# Patient Record
Sex: Male | Born: 1985 | Race: Black or African American | Hispanic: No | Marital: Single | Smoking: Former smoker
Health system: Southern US, Community
[De-identification: ages and names within clinical notes are randomized; demographics above are authoritative.]

## PROBLEM LIST (undated history)

## (undated) DIAGNOSIS — F32A Depression, unspecified: Secondary | ICD-10-CM

## (undated) DIAGNOSIS — F419 Anxiety disorder, unspecified: Secondary | ICD-10-CM

## (undated) DIAGNOSIS — F329 Major depressive disorder, single episode, unspecified: Secondary | ICD-10-CM

## (undated) DIAGNOSIS — G35 Multiple sclerosis: Secondary | ICD-10-CM

## (undated) DIAGNOSIS — I82409 Acute embolism and thrombosis of unspecified deep veins of unspecified lower extremity: Secondary | ICD-10-CM

## (undated) HISTORY — DX: Major depressive disorder, single episode, unspecified: F32.9

## (undated) HISTORY — DX: Depression, unspecified: F32.A

## (undated) HISTORY — DX: Anxiety disorder, unspecified: F41.9

---

## 2007-04-22 ENCOUNTER — Emergency Department (HOSPITAL_COMMUNITY): Admission: EM | Admit: 2007-04-22 | Discharge: 2007-04-22 | Payer: Self-pay | Admitting: Emergency Medicine

## 2007-06-02 ENCOUNTER — Emergency Department (HOSPITAL_COMMUNITY): Admission: EM | Admit: 2007-06-02 | Discharge: 2007-06-02 | Payer: Self-pay | Admitting: Family Medicine

## 2009-01-08 ENCOUNTER — Emergency Department (HOSPITAL_COMMUNITY): Admission: EM | Admit: 2009-01-08 | Discharge: 2009-01-08 | Payer: Self-pay | Admitting: Emergency Medicine

## 2009-09-15 ENCOUNTER — Emergency Department (HOSPITAL_COMMUNITY): Admission: EM | Admit: 2009-09-15 | Discharge: 2009-09-15 | Payer: Self-pay | Admitting: Emergency Medicine

## 2010-08-11 LAB — URINALYSIS, ROUTINE W REFLEX MICROSCOPIC
Hgb urine dipstick: NEGATIVE
Protein, ur: NEGATIVE mg/dL
Specific Gravity, Urine: 1.026 (ref 1.005–1.030)
Urobilinogen, UA: 1 mg/dL (ref 0.0–1.0)
pH: 5.5 (ref 5.0–8.0)

## 2010-08-11 LAB — RAPID URINE DRUG SCREEN, HOSP PERFORMED
Amphetamines: NOT DETECTED
Barbiturates: NOT DETECTED
Cocaine: NOT DETECTED

## 2010-08-11 LAB — POCT I-STAT, CHEM 8
Calcium, Ion: 1.18 mmol/L (ref 1.12–1.32)
Chloride: 105 mEq/L (ref 96–112)
Creatinine, Ser: 1.2 mg/dL (ref 0.4–1.5)
HCT: 47 % (ref 39.0–52.0)
Hemoglobin: 16 g/dL (ref 13.0–17.0)
TCO2: 25 mmol/L (ref 0–100)

## 2010-08-11 LAB — CK TOTAL AND CKMB (NOT AT ARMC): Total CK: 325 U/L — ABNORMAL HIGH (ref 7–232)

## 2012-05-07 DIAGNOSIS — I82409 Acute embolism and thrombosis of unspecified deep veins of unspecified lower extremity: Secondary | ICD-10-CM

## 2012-05-07 HISTORY — DX: Acute embolism and thrombosis of unspecified deep veins of unspecified lower extremity: I82.409

## 2012-07-06 ENCOUNTER — Encounter (HOSPITAL_COMMUNITY): Payer: Self-pay | Admitting: *Deleted

## 2012-07-06 ENCOUNTER — Emergency Department (INDEPENDENT_AMBULATORY_CARE_PROVIDER_SITE_OTHER)
Admission: EM | Admit: 2012-07-06 | Discharge: 2012-07-06 | Disposition: A | Discharge status: Home / Self Care | Payer: Self-pay | Source: Home / Self Care | Attending: Emergency Medicine | Admitting: Emergency Medicine

## 2012-07-06 DIAGNOSIS — S46011A Strain of muscle(s) and tendon(s) of the rotator cuff of right shoulder, initial encounter: Secondary | ICD-10-CM

## 2012-07-06 DIAGNOSIS — S239XXA Sprain of unspecified parts of thorax, initial encounter: Secondary | ICD-10-CM

## 2012-07-06 MED ORDER — MELOXICAM 15 MG PO TABS: 15.0000 mg | ORAL_TABLET | Freq: Every day | ORAL | Status: DC

## 2012-07-06 MED ORDER — METHOCARBAMOL 500 MG PO TABS: 500.0000 mg | ORAL_TABLET | Freq: Three times a day (TID) | ORAL | Status: DC

## 2012-07-06 MED ORDER — TRAMADOL HCL 50 MG PO TABS: 100.0000 mg | ORAL_TABLET | Freq: Three times a day (TID) | ORAL | Status: DC | PRN
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
## 2013-02-02 ENCOUNTER — Ambulatory Visit: Payer: Self-pay | Admitting: Physician Assistant

## 2013-02-04 ENCOUNTER — Emergency Department (HOSPITAL_COMMUNITY)
Admission: EM | Admit: 2013-02-04 | Discharge: 2013-02-04 | Disposition: A | Discharge status: Home / Self Care | Payer: 59 | Attending: Emergency Medicine | Admitting: Emergency Medicine

## 2013-02-04 ENCOUNTER — Encounter (HOSPITAL_COMMUNITY): Payer: Self-pay | Admitting: Emergency Medicine

## 2013-02-04 DIAGNOSIS — Z791 Long term (current) use of non-steroidal anti-inflammatories (NSAID): Secondary | ICD-10-CM | POA: Insufficient documentation

## 2013-02-04 DIAGNOSIS — Z79899 Other long term (current) drug therapy: Secondary | ICD-10-CM | POA: Insufficient documentation

## 2013-02-04 DIAGNOSIS — I8390 Asymptomatic varicose veins of unspecified lower extremity: Secondary | ICD-10-CM | POA: Insufficient documentation

## 2013-02-04 DIAGNOSIS — M25561 Pain in right knee: Secondary | ICD-10-CM

## 2013-02-04 MED ORDER — IBUPROFEN 800 MG PO TABS
800.0000 mg | ORAL_TABLET | Freq: Once | ORAL | Status: AC
  Administered 2013-02-04: 800 mg via ORAL
  Filled 2013-02-04: qty 1

## 2013-02-04 NOTE — ED Notes (Signed)
Pt has multiple hard, moveable lumps to the inside of his R lower leg. Pt reports pain when bending his knee, or driving for long periods of time.  Pt reports these have been present for the past year, and came in tonight because a family member was concerned.  Pt has not had this evaluated before.

## 2013-02-04 NOTE — ED Provider Notes (Signed)
CSN: [id number]     Arrival date & time 02/04/13  2030 History   First MD Initiated Contact with Patient 02/04/13 2126     Chief Complaint  Patient presents with  . Leg Pain   (Consider location/radiation/quality/duration/timing/severity/associated sxs/prior Treatment) HPI  IZAAK SAHR is a 27 y.o. male complaining of non-painful lumps to medial side of lower legs (right greater than left) and atraumatic right knee pain x1 year. Nothing is character, frequency  or severity is changing but he came in tonight because a family member was concerned. Pt denies DVT or PE, calf pain, peripheral edema, chest pain, shortness of breath, difficulty walking, numbness, weakness, paresthesia. Patient reports the pain in the knee is mild, 3/10, exacerbated by staying still for long periods of time.   History reviewed. No pertinent past medical history. History reviewed. No pertinent past surgical history. History reviewed. No pertinent family history. History  Substance Use Topics  . Smoking status: Never Smoker   . Smokeless tobacco: Not on file  . Alcohol Use: Yes     Comment: occasional    Review of Systems 10 systems reviewed and found to be negative, except as noted in the HPI   Allergies  Review of patient's allergies indicates no known allergies.  Home Medications   Current Outpatient Rx  Name  Route  Sig  Dispense  Refill  . meloxicam (MOBIC) 15 MG tablet   Oral   Take 1 tablet (15 mg total) by mouth daily.   15 tablet   0   . methocarbamol (ROBAXIN) 500 MG tablet   Oral   Take 1 tablet (500 mg total) by mouth 3 (three) times daily.   30 tablet   0   . traMADol (ULTRAM) 50 MG tablet   Oral   Take 2 tablets (100 mg total) by mouth every 8 (eight) hours as needed for pain.   30 tablet   0    BP 148/82  Pulse 80  Temp(Src) 98 F (36.7 C) (Oral)  Resp 18  Ht 6\' 5"  (1.956 m)  Wt 370 lb (167.831 kg)  BMI 43.87 kg/m2  SpO2 100% Physical Exam  Nursing note and  vitals reviewed. Constitutional: He is oriented to person, place, and time. He appears well-developed and well-nourished. No distress.  Morbidly obese  HENT:  Head: Normocephalic.  Mouth/Throat: Oropharynx is clear and moist.  Eyes: Conjunctivae and EOM are normal. Pupils are equal, round, and reactive to light.  Neck: Normal range of motion.  Cardiovascular: Normal rate, regular rhythm and intact distal pulses.  Exam reveals no gallop and no friction rub.   No murmur heard. Severe varicosity to bilateral lower extremities, right greater than left. No signs of phlebitis no overlying warmth or tenderness to palpation.  Pulmonary/Chest: Effort normal and breath sounds normal. No stridor. No respiratory distress. He has no wheezes. He has no rales. He exhibits no tenderness.  Abdominal: Soft. Bowel sounds are normal. He exhibits no distension and no mass. There is no tenderness. There is no rebound and no guarding.  Musculoskeletal: Normal range of motion. He exhibits no edema.  No calf asymmetry, superficial collaterals, palpable cords, edema, Homans sign negative bilaterally.   Right Knee: No deformity, erythema or abrasions. FROM. No effusion,  +crepitance. Anterior and posterior drawer show no abnormal laxity. Stable to valgus and varus stress. Joint lines are non-tender. Neurovascularly intact. Pt ambulates with non-antalgic gait.     Neurological: He is alert and oriented to person, place, and  time.  Skin: Skin is warm.  No rashes  Psychiatric: He has a normal mood and affect.    ED Course  Procedures (including critical care time) Labs Review Labs Reviewed - No data to display Imaging Review No results found.  MDM   1. Varicose vein of leg   2. Right knee pain    Filed Vitals:   02/04/13 2033 02/04/13 2106 02/04/13 2131  BP: 139/93 148/82 149/88  Pulse: 82 80 74  Temp: 98.3 F (36.8 C) 98 F (36.7 C) 97.9 F (36.6 C)  TempSrc: Oral Oral Oral  Resp: 16 18 20   Height:   6\' 5"  (1.956 m)   Weight:  370 lb (167.831 kg)   SpO2: 100% 100% 100%     YOVANI COGBURN is a 27 y.o. male obese male with some varicosities, no signs of phlebitis or DVT and right knee pain, knee exam is benign except for a mild crepitance. Advised patient to use compression stockings, follow with primary care and vascular surgery. Return precautions discussed. This is a shared visit with the attending physician who personally evaluated the patient and agrees with the care plan.    Medications  ibuprofen (ADVIL,MOTRIN) tablet 800 mg (800 mg Oral Given 02/04/13 2207)    Pt is hemodynamically stable, appropriate for, and amenable to discharge at this time. Pt verbalized understanding and agrees with care plan. All questions answered. Outpatient follow-up and specific return precautions discussed.    Note: Portions of this report may have been transcribed using voice recognition software. Every effort was made to ensure accuracy; however, inadvertent computerized transcription errors may be present      Wynetta Emery, PA-C 02/05/13 0130

## 2013-02-04 NOTE — ED Notes (Signed)
Pt denies pain in his right leg at this time, has had pain on and off for the last year. Came to the ED tonight due to concerns from his grandmother. Large moveable lumps on the medial calf noted/

## 2013-02-06 ENCOUNTER — Encounter (HOSPITAL_COMMUNITY): Payer: Self-pay

## 2013-02-06 ENCOUNTER — Emergency Department (INDEPENDENT_AMBULATORY_CARE_PROVIDER_SITE_OTHER)
Admission: EM | Admit: 2013-02-06 | Discharge: 2013-02-06 | Disposition: A | Discharge status: Home / Self Care | Payer: 59 | Source: Home / Self Care

## 2013-02-06 DIAGNOSIS — I8390 Asymptomatic varicose veins of unspecified lower extremity: Secondary | ICD-10-CM

## 2013-02-06 DIAGNOSIS — R200 Anesthesia of skin: Secondary | ICD-10-CM

## 2013-02-06 DIAGNOSIS — I8391 Asymptomatic varicose veins of right lower extremity: Secondary | ICD-10-CM

## 2013-02-06 DIAGNOSIS — R209 Unspecified disturbances of skin sensation: Secondary | ICD-10-CM

## 2013-02-06 NOTE — ED Notes (Signed)
[facility] for [facility] sign up process

## 2013-02-06 NOTE — ED Notes (Signed)
Reports he has been having feeling numbness and pain on his right side for past few days. Was seen for pain in right leg 10-1 @ [facility] , d/c dx was varicose veins

## 2013-02-06 NOTE — ED Provider Notes (Signed)
CSN: [id number]     Arrival date & time 02/06/13  1204 History   First MD Initiated Contact with Patient 02/06/13 1240     Chief Complaint  Patient presents with  . Weakness   (Consider location/radiation/quality/duration/timing/severity/associated sxs/prior Treatment) HPI Comments: Morbidly obese 27 year old male is complaining of intermittent right arm and leg tingling and weakness for 3 days. He works in a [employer] and states it is for his a physical burden is working overtime. He is right handed and uses of right-handed for picking, washing dishes picking up objects moving from one side to another and is on his legs for long periods of time. This seems to exacerbate his symptoms. There is mild pain to the right posterior shoulder within trapezius muscle. Denies weakness or asymmetry to the face. No problems with vision, speech, swallowing or hearing. States his gait is normal, bounced and smooth.   History reviewed. No pertinent past medical history. History reviewed. No pertinent past surgical history. History reviewed. No pertinent family history. History  Substance Use Topics  . Smoking status: Never Smoker   . Smokeless tobacco: Not on file  . Alcohol Use: Yes     Comment: occasional    Review of Systems  HENT: Negative.   Respiratory: Negative.   Cardiovascular: Negative.   Gastrointestinal: Negative.   Genitourinary: Negative.   Neurological: Positive for weakness and numbness. Negative for light-headedness and headaches.  Psychiatric/Behavioral: Negative.     Allergies  Review of patient's allergies indicates no known allergies.  Home Medications  No current outpatient prescriptions on file. BP 120/67  Pulse 87  Temp(Src) 98.1 F (36.7 C) (Oral)  Resp 16  SpO2 100% Physical Exam  Nursing note and vitals reviewed. Constitutional: He is oriented to person, place, and time. He appears well-developed and well-nourished.  HENT:  Head: Normocephalic and atraumatic.   Eyes: EOM are normal. Left eye exhibits no discharge.  Neck: Normal range of motion. Neck supple.  Neurological: He is alert and oriented to person, place, and time. No cranial nerve deficit.  Sensation to light touch with a Q-tip along the right shoulder to the fingertips is normal. Sensation from the knee to the ankle is intact. Strength of the upper arms, shoulder forearms wrist and hands are 5 over 5 and symmetric. Strength in the right lower leg is normal. Quads are normal Is normal. There is no edema. Full range of motion of the extremities. Capillary refill is brisk. Radial and tibialis pulses are 2+. Normal warmth and color. Distal neurovascular motor sensory is normal. No abnormal findings on this exam. His balance is normal.  Skin: Skin is warm and dry.  Psychiatric: He has a normal mood and affect.    ED Course  Procedures (including critical care time) Labs Review Labs Reviewed - No data to display Imaging Review No results found.  MDM   1. Numbness and tingling of right arm and leg   2. 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 He was evaluated emergent apartment 2 days ago with a diagnosis of varicosities in the right leg. Circulation otherwise normal with good pulses. Reassurance. He is to followup with a PCP and call the number written above. Red flags in warning symptoms were discussed in written instructions for this are also given. He is exhibiting no signs of CVA.  Hayden Rasmussen, NP 02/06/13 1349

## 2013-02-07 NOTE — ED Provider Notes (Signed)
Medical screening examination/treatment/procedure(s) were performed by non-physician practitioner and as supervising physician I was immediately available for consultation/collaboration.    Vickee Mormino R Ingrid Shifrin, MD 02/07/13 0842 

## 2013-02-07 NOTE — ED Provider Notes (Signed)
Medical screening examination/treatment/procedure(s) were performed by resident physician or non-physician practitioner and as supervising physician I was immediately available for consultation/collaboration.   Matthews Franks DOUGLAS MD.   Abhay Godbolt D Serine Kea, MD 02/07/13 1549 

## 2013-06-07 HISTORY — PX: VASCULAR SURGERY: SHX849

## 2013-08-13 ENCOUNTER — Emergency Department (HOSPITAL_COMMUNITY): Payer: 59

## 2013-08-13 ENCOUNTER — Emergency Department (HOSPITAL_COMMUNITY)
Admission: EM | Admit: 2013-08-13 | Discharge: 2013-08-13 | Disposition: A | Discharge status: Home / Self Care | Payer: 59 | Attending: Emergency Medicine | Admitting: Emergency Medicine

## 2013-08-13 ENCOUNTER — Encounter (HOSPITAL_COMMUNITY): Payer: Self-pay | Admitting: Emergency Medicine

## 2013-08-13 DIAGNOSIS — R5383 Other fatigue: Principal | ICD-10-CM

## 2013-08-13 DIAGNOSIS — G35 Multiple sclerosis: Secondary | ICD-10-CM | POA: Insufficient documentation

## 2013-08-13 DIAGNOSIS — R531 Weakness: Secondary | ICD-10-CM

## 2013-08-13 DIAGNOSIS — R5381 Other malaise: Secondary | ICD-10-CM | POA: Insufficient documentation

## 2013-08-13 DIAGNOSIS — Z86718 Personal history of other venous thrombosis and embolism: Secondary | ICD-10-CM | POA: Insufficient documentation

## 2013-08-13 HISTORY — DX: Acute embolism and thrombosis of unspecified deep veins of unspecified lower extremity: I82.409

## 2013-08-13 LAB — APTT: aPTT: 30 seconds (ref 24–37)

## 2013-08-13 LAB — COMPREHENSIVE METABOLIC PANEL
ALBUMIN: 4.3 g/dL (ref 3.5–5.2)
ALK PHOS: 58 U/L (ref 39–117)
ALT: 21 U/L (ref 0–53)
AST: 22 U/L (ref 0–37)
BUN: 9 mg/dL (ref 6–23)
CALCIUM: 9.6 mg/dL (ref 8.4–10.5)
CO2: 21 meq/L (ref 19–32)
Chloride: 104 mEq/L (ref 96–112)
Creatinine, Ser: 0.67 mg/dL (ref 0.50–1.35)
GFR calc Af Amer: >90 mL/min (ref >90)
GFR calc non Af Amer: >90 mL/min (ref >90)
GLUCOSE: 101 mg/dL — AB (ref 70–99)
POTASSIUM: 4.6 meq/L (ref 3.7–5.3)
Sodium: 141 mEq/L (ref 137–147)
Total Bilirubin: 1.1 mg/dL (ref 0.3–1.2)
Total Protein: 7.8 g/dL (ref 6.0–8.3)

## 2013-08-13 LAB — PROTIME-INR
INR: 0.96 (ref 0.00–1.49)
Prothrombin Time: 12.6 seconds (ref 11.6–15.2)

## 2013-08-13 LAB — DIFFERENTIAL
BASOS PCT: 1 % (ref 0–1)
Basophils Absolute: 0.1 10*3/uL (ref 0.0–0.1)
EOS ABS: 0.2 10*3/uL (ref 0.0–0.7)
Eosinophils Relative: 2 % (ref 0–5)
LYMPHS ABS: 2 10*3/uL (ref 0.7–4.0)
LYMPHS PCT: 18 % (ref 12–46)
MONOS PCT: 6 % (ref 3–12)
Monocytes Absolute: 0.7 10*3/uL (ref 0.1–1.0)
NEUTROS ABS: 8 10*3/uL — AB (ref 1.7–7.7)
NEUTROS PCT: 73 % (ref 43–77)

## 2013-08-13 LAB — CBC
HCT: 46.5 % (ref 39.0–52.0)
HEMOGLOBIN: 15.8 g/dL (ref 13.0–17.0)
MCH: 27.2 pg (ref 26.0–34.0)
MCHC: 34 g/dL (ref 30.0–36.0)
MCV: 80.2 fL (ref 78.0–100.0)
Platelets: 302 10*3/uL (ref 150–400)
RBC: 5.8 MIL/uL (ref 4.22–5.81)
RDW: 14.1 % (ref 11.5–15.5)
WBC: 10.8 10*3/uL — AB (ref 4.0–10.5)

## 2013-08-13 NOTE — ED Notes (Signed)
Patient transported to MRI 

## 2013-08-13 NOTE — Discharge Instructions (Signed)
Please call and have close follow up with [facility] for further management of your newly diagnosed multiple sclerosis.  Return if you are unable to follow up or if you have worsening symptoms.    Multiple Sclerosis Multiple sclerosis (MS) is a disease of the central nervous system. It leads to loss of the insulating covering of the nerves (myelin sheath) of your brain. When this happens, brain signals do not get transmitted properly or may not get transmitted at all. The symptoms of MS occur in episodes or attacks. These attacks may last weeks to months. There may be long periods of nearly no problems between attacks. The age of onset of MS varies.  CAUSES The cause of MS is unknown. However, it is more common in the [place] than in the [place]. RISK FACTORS There is a higher incidence of MS in women than in men. MS is not an inherited illness, although your risk of MS is higher if you have a relative with MS. SIGNS AND SYMPTOMS  The symptoms of MS occur in episodes or attacks. These attacks may last weeks to months. There may be long periods of almost no symptoms between attacks. The symptoms of MS vary. This is because of the many different ways it affects the central nervous system. The main symptoms of MS include:  Vision problems and eye pain.  Numbness.  Weakness.  Paralysis in your arms, hands, feet, and legs (extremities).  Balance problems.  Tremors. DIAGNOSIS  Your health care provider can diagnose MS with the help of imaging exams and lab tests. These may include specialized X-ray exams and spinal fluid tests. The best imaging exam to confirm a diagnosis of MS is MRI. TREATMENT  There is no known cure for MS, but there are medicines that can decrease the number and frequency of attacks. Steroids are often used for short-term relief. Physical and occupational therapy may also help. HOME CARE INSTRUCTIONS   Take medicines as directed by  your health care provider.  Exercise as directed by your health care provider. SEEK MEDICAL CARE IF: You begin to feel depressed. SEEK IMMEDIATE MEDICAL CARE IF:  You develop paralysis.  You develop problems with bladder, bowel, or sexual function.  You develop mental changes, such as forgetfulness or mood swings.  You have a seizure. Document Released: 04/20/2000 Document Revised: 02/11/2013 Document Reviewed: 12/29/2012 [facility] Patient Information 2014 [place], [place].  Emergency Department Resource Guide 1) Find a Doctor and Pay Out of Pocket Although you won't have to find out who is covered by your insurance plan, it is a good idea to ask around and get recommendations. You will then need to call the office and see if the doctor you have chosen will accept you as a new patient and what types of options they offer for patients who are self-pay. Some doctors offer discounts or will set up payment plans for their patients who do not have insurance, but you will need to ask so you aren't surprised when you get to your appointment.  2) Contact Your Local Health Department Not all health departments have doctors that can see patients for sick visits, but many do, so it is worth a call to see if yours does. If you don't know where your local health department is, you can check in your phone book. The CDC also has a tool to help you locate your state's health department, and many state websites also have listings of all of their local  health departments.  3) Find a Walk-in Clinic If your illness is not likely to be very severe or complicated, you may want to try a walk in clinic. These are popping up all over the country in pharmacies, drugstores, and shopping centers. They're usually staffed by nurse practitioners or physician assistants that have been trained to treat common illnesses and complaints. They're usually fairly quick and inexpensive. However, if you have serious medical issues or  chronic medical problems, these are probably not your best option.  No Primary Care Doctor: - Call Health Connect at  [phone] - they can help you locate a primary care doctor that  accepts your insurance, provides certain services, etc. - Physician Referral Service- [phone]  Chronic Pain Problems: Organization         Address  Phone   Notes  [facility]  [phone] Patients need to be referred by their primary care doctor.   Medication Assistance: Organization         Address  Phone   Notes  [facility] Medication [facility] [address] [place] [place], [place] [address] [phone] --Must be a resident of [facility] -- Must have NO insurance coverage whatsoever (no Medicaid/ Medicare, etc.) -- The pt. MUST have a primary care doctor that directs their care regularly and follows them in the community   [facility]ist  [phone]   [employer]  [phone]    Agencies that provide inexpensive medical care: Organization         Address  Phone   Notes  [facility]  [phone]   [facility]    [phone]   [facility] [address] [place], [place] [address] [phone]   [facility] [address] [place] [place] [phone]   Planned Parenthood    [phone]   [facility]    [phone]   [facility]  [address], [place] Phone:  [phone], Fax:  [phone] Hours of Operation:  9 am - 6 pm, M-F.  Also accepts Medicaid/Medicare and self-pay.  [facility]  [address], [place] Phone: [phone], Fax: [phone]. Hours of Operation:  8:30 am - 5:30 pm, M-F.  Also accepts Medicaid and self-pay.  [facility] [place] [address], [place] Phone: [phone]   [facility] [address] Natasha Bence [place], [place]  [phone], Ext. [phone] Mondays & Thursdays: 7-9 AM.  First 15 patients are seen on a first come, first serve basis.    Medicaid-accepting [facility] Providers:  Organization         Address  Phone   Notes  [facility] [address], [place] [phone] Also accepts self-pay patients.  [facility] [address] Laurell Josephs [place], [place]  [phone]   [facility] [address], [place] [phone]   [facility] [address], [place] [phone]   Renaye Rakers [address], [place]   [phone] Only accepts [place] Access [place] patients after they have their name applied to their card.   Self-Pay (no insurance) in [facility]:  Organization         Address  Phone   Notes  Sickle Cell Patients, [employer]  Internal Medicine [address] [place], [place] [phone]   [facility] [address] [place] [address] [place], [place] [phone]   [facility] [place]  [address], [place] [phone]   [facility]/Dr. Osei-Bonsu  [address], [place] or [address], [place] [phone] Phone number for both [place] and [place] locations is the same.  [facility] [address] [place] [phone]   [facility] [address], [place] or [address] [phone]   [facility] [address], [place] [phone], phone; [phone], fax Sees patients 1st and 3rd Saturday of every month.  Must not qualify for public or private insurance (i.e. Medicaid, Medicare, [place] Health Choice, Veterans' Benefits)  Household income should be no more than 200% of the poverty level The clinic cannot treat you if you are pregnant or think you are pregnant  Sexually transmitted  diseases are not treated at the clinic.    Dental Care: Organization         Address  Phone  Notes  [facility] [address] [place], [place] [phone] Accepts children up to age 46 who are enrolled in [place] or [place] Health Choice; pregnant women with a Medicaid card; and children who have applied for Medicaid or [place] Health Choice, but were declined, whose parents can pay a reduced fee at time of service.  [facility]  [address], [place] [phone] Accepts children up to age 47 who are enrolled in [place] or [place] Health Choice; pregnant women with a Medicaid card; and children who have applied for Medicaid or [place] Health Choice, but were declined, whose parents can pay a reduced fee at time of service.  [facility]  [address] [place], [place] [phone] Patients are seen by appointment only. Walk-ins are not accepted. [facility] will see patients 43 years of age and older. Monday - Tuesday (8am-5pm) Most Wednesdays (8:30-5pm) $30 per visit, cash only  [facility]  [address], [facility] [phone] Patients are seen by appointment only. Walk-ins are not accepted. [facility] will see patients 73 years of age and older. One Wednesday Evening (Monthly: Volunteer Based).  $30 per visit, cash only  [employer]  [phone] for adults; Children under age 15, call Graduate Pediatric Dentistry at [phone]. Children aged 7-14, please call [phone] to request a pediatric application.  Dental services are provided in all areas of dental care including fillings, crowns and bridges, complete and partial dentures, implants, gum treatment, root canals, and extractions. Preventive care is also provided. Treatment is provided to both adults and children. Patients are selected via a  lottery and there is often a waiting list.   [facility] [address] [place]  [phone] [email or web address]   [facility] [address] [place], [place] [phone], Ext. [phone] Second and Fourth Thursday of each month, opens at 6:30 AM; Clinic ends at 9 AM.  Patients are seen on a first-come first-served basis, and a limited number are seen during each clinic.   [facility]  [address] Ether Griffins [place], [place] [phone]   Eligibility Requirements You must have  lived in [place], [place], or [place] counties for at least the last three months.   You cannot be eligible for state or federal sponsored [place], including [employer], [place], or [employer].   You generally cannot be eligible for healthcare insurance through your employer.    How to apply: Eligibility screenings are held every Tuesday and Wednesday afternoon from 1:00 pm until 4:00 pm. You do not need an appointment for the interview!  [facility] [address] [place], [place] [phone]   [facility] Health Department  [phone]   [facility] Health Department  [phone]   [facility] Health Department  [phone]    Behavioral Health Resources in the Community: Intensive Outpatient Programs Organization         Address  Phone  Notes  [facility] Services [address], [place], [place] [phone]   [facility] [address] [place], [place] [phone]   [facility] [address] [place], [place]  [phone]   [facility] [address],  [place], [place] [phone] or [phone]   Substance Abuse Resources Organization         Address  Phone  Notes  [facility]  [phone]   [facility]  [phone]   [facility]  [phone]   Floydene Flock  [phone]   Residential &  Outpatient Substance Abuse Program  [phone]   Psychological Services Organization         Address  Phone  Notes  [facility]  [phone]   [facility]  [phone]   [facility] [address] [place] [phone] or [phone]    Mobile Crisis Teams Organization         Address  Phone  Notes  [facility], Mobile Crisis Care Unit  [phone]   [facility]  [address] [place], [place] [phone]   Doristine Locks [address] [place] [phone]    Self-Help/Support Groups Organization         Address  Phone             Notes  [facility] [place] - variety of support groups  336- I7437963 Call for more information  Narcotics Anonymous (NA), [facility] [address], [employer]   2 meetings at this location   Statistician         Address  Phone  Notes  [facility] [address],    [place]  [phone]   [facility]  [address] [place] [address], [place], [place] [phone]   [facility] [address] [place], [place] [phone] Admissions: 8am-3pm M-F  [facility] [address],    [place], [place] [phone]   [facility] [address] Starling Manns [place], [place] [phone]   [facility] [address],  [place], [place] [phone]   [facility] - Intensive Outpatient [address]urell Joseph[address], [place], [place] [phone]   [facility] [address] [place]  [place], [place] [phone] or [phone]   [facility] ([facility]) [address] [place], [place] [phone] Accepts Medicaid  [facility] [address]  [place] [phone] Substance Abuse/Addiction Treatment   [facility] Resources Organization          Address  Phone  Notes  [facility]  [phone]) [phone]   Angie FavaJulie Brannon, PhD [address] [place], [place]   [phone]) [phone] or [phone]) [phone]   [facility]   [address] [place], [place] [phone](336) [phone]   [facility] [address], [place], [place] [phone](336) [phone] Insurance/Medicaid/sponsorship through [facility]  [facility] [address]                                    [place], [place] [phone](336) [phone] Therapy/tele-psych/case  [facility] [address]   [place], [place] [phone](336) [phone]    Dr. Lolly MustacheArfeen  [phone]) [phone]   [facility]  [employer] [facility] 1) [address], [place] 2) [address], [place] 3)  [address], [place] [phone](336) [phone](336) [phone]  [phone](336) [phone]   [facility] Child Abuse Hotline [phone](336) [phone] or [phone](336) [phone] (After Hours)

## 2013-08-13 NOTE — ED Provider Notes (Signed)
CSN: [id number]     Arrival date & time 08/13/13  [id number] History   First MD Initiated Contact with Patient 08/13/13 1124     Chief Complaint  Patient presents with  . Numbness     (Consider location/radiation/quality/duration/timing/severity/associated sxs/prior Treatment) HPI  28 year old male who presents complaining of intermittent right-sided numbness. Patient states he would experiencing a tingling numb sensation from his right arm all the way down to his right leg usually lasting for less than a minute, problem with exertion especially getting out of his bed. He also endorsed sensation of lightheadedness which lasting for about a minute at the same time.  This sensation doesn't happen all the time but enough to bother him. 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 States he is due for another procedure next week. Although there is a documented DVT history in the nursing note, this is likely an error as patient has no prior history of DVT and currently not on any anticoagulants. Patient otherwise denies having any fever, chills any double vision, loss of vision, headache, neck pain, neck stiffness, chest pain or shortness of breath. Endorse occasional abdominal cramping for the past week but denies any nausea vomiting or diarrhea. No prior history of stroke. No history of MS.  Past Medical History  Diagnosis Date  . DVT (deep venous thrombosis)    Past Surgical History  Procedure Laterality Date  . Vascular surgery     No family history on file. History  Substance Use Topics  . Smoking status: Never Smoker   . Smokeless tobacco: Not on file  . Alcohol Use: Yes     Comment: occasional    Review of Systems  All other systems reviewed and are negative.     Allergies  Review of patient's allergies indicates no known allergies.  Home Medications   Current Outpatient Rx  Name  Route   Sig  Dispense  Refill  . aspirin 325 MG tablet   Oral   Take 650 mg by mouth every 6 (six) hours as needed for mild pain.          BP 134/73  Pulse 80  Temp(Src) 99.3 F (37.4 C) (Oral)  Resp 16  Ht 6\' 5"  (1.956 m)  SpO2 100% Physical Exam  Constitutional: He is oriented to person, place, and time. He appears well-developed and well-nourished. No distress.  HENT:  Head: Atraumatic.  Right Ear: External ear normal.  Left Ear: External ear normal.  Mouth/Throat: Oropharynx is clear and moist.  Eyes: Conjunctivae and EOM are normal. Pupils are equal, round, and reactive to light.  Neck: Normal range of motion. Neck supple.  Cardiovascular: Normal rate and regular rhythm.   Pulmonary/Chest: Effort normal and breath sounds normal.  Abdominal: Soft. There is no tenderness.  Neurological: He is alert and oriented to person, place, and time.  Neurologic exam:  Speech clear, pupils equal round reactive to light, extraocular movements intact  Normal peripheral visual fields Cranial nerves III through XII normal including no facial droop Follows commands, moves all extremities x4, normal strength to bilateral upper and lower extremities at all major muscle groups including grip Sensation normal to light touch  Coordination intact, no limb ataxia, finger-nose-finger normal Rapid alternating movements normal No pronator drift Gait normal   Skin: No rash noted.  Psychiatric: He has a normal mood and affect.    ED Course  Procedures (including  critical care time)  11:44 AM Pt report intermittent R sided paresthesia.  Pt has no focal neuro deficits on exam.  No active complaints at this time.  Work up initiated.  Need to r/o for MS.   ,5:14 PM Brain MRI without contrast demonstrate widespread supratentorial fulminant acute multiple sclerosis.  This finding is consistent with pt's complaint.  He denies any visual changes.  Does not have a PCP.  I have consulted with Dr. Deretha Emory  and also with neurologist Dr. Thad Ranger who will see and evaluate pt.  Pt made aware of findings.    7:14 PM Dr. Thad Ranger sts pt at this time does not need solumedrol.  He will need close follow up with neurologist for outpt therapy with immunomodulator (copaxin, rebif, etc...) and physical therapy if appropriate.  Pt agrees to f/u closely with neurologist for further care.  No active sxs at this time.    Labs Review Labs Reviewed  CBC - Abnormal; Notable for the following:    WBC 10.8 (*)    All other components within normal limits  DIFFERENTIAL - Abnormal; Notable for the following:    Neutro Abs 8.0 (*)    All other components within normal limits  COMPREHENSIVE METABOLIC PANEL - Abnormal; Notable for the following:    Glucose, Bld 101 (*)    All other components within normal limits  PROTIME-INR  APTT   Imaging Review Mr Brain Wo Contrast  08/13/2013   CLINICAL DATA:  Intermittent right-sided numbness.  EXAM: MRI HEAD WITHOUT CONTRAST  TECHNIQUE: Multiplanar, multiecho pulse sequences of the brain and surrounding structures were obtained without intravenous contrast.  COMPARISON:  CT head from 01/08/2009 was normal.  FINDINGS: Widespread areas of predominantly supratentorial periventricular greater than subcortical white matter signal abnormality, many of which are ovoid or circular in nature. Some display restricted diffusion. Prominent involvement of the splenium corpus callosum as well as numerous ovoid lesions perpendicular to the callosum, some which demonstrate a small vein coursing through its midportion. Vasogenic edema surrounds the largest lesions in the right centrum semiovale, and left parietal subcortical white matter, as well as internal capsule and corticospinal tract extending into the left midbrain. Findings are consistent with fulminant acute multiple sclerosis. No dominant lesion to suggest tumefactive MS.  IMPRESSION: Findings consistent with widespread supratentorial  fulminant acute multiple sclerosis. No dominant lesion to suggest tumefactive MS. See comments above.   Electronically Signed   By: Davonna Belling M.D.   On: 08/13/2013 17:06     EKG Interpretation None      MDM   Final diagnoses:  Weakness of right side of body  Multiple sclerosis    BP 120/60  Pulse 69  Temp(Src) 97.9 F (36.6 C) (Oral)  Resp 16  Ht 6\' 5"  (1.956 m)  SpO2 100%  I have reviewed nursing notes and vital signs. I personally reviewed the imaging tests through PACS system  I reviewed available ER/hospitalization records thought the EMR     Fayrene Helper, [place] 08/13/13 1922

## 2013-08-13 NOTE — ED Notes (Signed)
Returned from MRI, no problems at this time.

## 2013-08-13 NOTE — ED Notes (Signed)
Pt c/o intermittent R sided numbness, cramping and pain x 1 week.  S/s are worse immediately after he stands up and when he's laying in bed.  States recent surgery to remove vein from R leg d/t blood clot and he is d/t have "another procedure" next week.  AO x 4.

## 2013-08-13 NOTE — ED Provider Notes (Signed)
Medical screening examination/treatment/procedure(s) were performed by non-physician practitioner and as supervising physician I was immediately available for consultation/collaboration.   EKG Interpretation None       Shelda Jakes, MD 08/13/13 [phone]

## 2013-08-24 ENCOUNTER — Encounter: Payer: Self-pay | Admitting: Diagnostic Neuroimaging

## 2013-08-24 ENCOUNTER — Ambulatory Visit (INDEPENDENT_AMBULATORY_CARE_PROVIDER_SITE_OTHER): Payer: 59 | Admitting: Diagnostic Neuroimaging

## 2013-08-24 VITALS — BP 136/84 | HR 81 | Ht 78.0 in | Wt 374.0 lb

## 2013-08-24 DIAGNOSIS — R200 Anesthesia of skin: Secondary | ICD-10-CM

## 2013-08-24 DIAGNOSIS — R930 Abnormal findings on diagnostic imaging of skull and head, not elsewhere classified: Secondary | ICD-10-CM | POA: Insufficient documentation

## 2013-08-24 DIAGNOSIS — G35 Multiple sclerosis: Secondary | ICD-10-CM | POA: Insufficient documentation

## 2013-08-24 DIAGNOSIS — R5383 Other fatigue: Secondary | ICD-10-CM

## 2013-08-24 DIAGNOSIS — R5381 Other malaise: Secondary | ICD-10-CM | POA: Insufficient documentation

## 2013-08-24 DIAGNOSIS — R531 Weakness: Secondary | ICD-10-CM

## 2013-08-24 DIAGNOSIS — R209 Unspecified disturbances of skin sensation: Secondary | ICD-10-CM | POA: Insufficient documentation

## 2013-08-24 DIAGNOSIS — M62838 Other muscle spasm: Secondary | ICD-10-CM

## 2013-08-24 DIAGNOSIS — R9089 Other abnormal findings on diagnostic imaging of central nervous system: Secondary | ICD-10-CM

## 2013-08-24 NOTE — Progress Notes (Signed)
[facility]  PATIENT: William Fitzgerald DOB: 12-01-1985  REFERRING CLINICIAN: Zackowski HISTORY FROM: patient  REASON FOR VISIT: new consult   HISTORICAL  CHIEF COMPLAINT:  Chief Complaint  Patient presents with  . Numbness    weakness on R side    HISTORY OF PRESENT ILLNESS:   28 year old right-handed male here for evaluation of possible multiple sclerosis.  2010 patient had intermittent right leg stiffness and weakness. He was diagnosed with "blood clot" in his right leg, not treated with anticoagulation. He recently had laser vein removal on this leg. In  2.5 weeks ago patient noted right arm and right leg weakness and numbness. He's also having dizziness when he gets out of bed. He's having more balance difficulty. Patient symptoms have gradually worsened. Patient went to the emergency room for evaluation, had MRI of the brain which showed multiple supratentorial and infratentorial white matter lesions, suspicious for multiple sclerosis. Patient referred to me for further evaluation.  No family history of multiple sclerosis. No episodes of unilateral visual loss. No slurred speech or trouble talking. No bowel or bladder incontinence.   REVIEW OF SYSTEMS: Full 14 system review of systems performed and notable only for weakness dizziness and sleepiness muscle spasms.  ALLERGIES: No Known Allergies  HOME MEDICATIONS: Outpatient Prescriptions Prior to Visit  Medication Sig Dispense Refill  . aspirin 325 MG tablet Take 650 mg by mouth every 6 (six) hours as needed for mild pain.       No facility-administered medications prior to visit.    PAST MEDICAL HISTORY: Past Medical History  Diagnosis Date  . DVT (deep venous thrombosis)     PAST SURGICAL HISTORY: Past Surgical History  Procedure Laterality Date  . Vascular surgery      FAMILY HISTORY: Family History  Problem Relation Age of Onset  . Lung cancer Maternal Aunt   . Diabetes Maternal  Grandfather   . Lung cancer Maternal Aunt     SOCIAL HISTORY:  History   Social History  . Marital Status: Single    Spouse Name: N/A    Number of Children: 0  . Years of Education: 12th   Occupational History  .      [employer]   Social History Main Topics  . Smoking status: Never Smoker   . Smokeless tobacco: Never Used  . Alcohol Use: Yes     Comment: occasional  . Drug Use: No     Comment: quit marijuana 2014  . Sexual Activity: Not Currently   Other Topics Concern  . Not on file   Social History Narrative   Patient lives at home with family.   Caffeine Use: tea occasionally     PHYSICAL EXAM  Filed Vitals:   08/24/13 0921  BP: 136/84  Pulse: 81  Height: 6\' 6"  (1.981 m)  Weight: 374 lb (169.645 kg)    Not recorded    Body mass index is 43.23 kg/(m^2).  GENERAL EXAM: Patient is in no distress; well developed, nourished and groomed; neck is supple; NO LHERMITTE'S SIGN  CARDIOVASCULAR: Regular rate and rhythm, no murmurs, no carotid bruits  NEUROLOGIC: MENTAL STATUS: awake, alert, oriented to person, place and time, recent and remote memory intact, normal attention and concentration, language fluent, comprehension intact, naming intact, fund of knowledge appropriate CRANIAL NERVE: no papilledema on fundoscopic exam, pupils equal and reactive to light, visual fields full to confrontation, extraocular muscles intact, no nystagmus, DECR RIGHT FACE TEMP SENSATION; facial strength symmetric, hearing  intact, palate elevates symmetrically, uvula midline, shoulder shrug symmetric, tongue midline. MOTOR: normal bulk; SLIGHTLY INCR TONE IN RUE AND RLE. Full strength in the BUE, BLE SENSORY: normal and symmetric to light touch, pinprick, temperature, vibration COORDINATION: finger-nose-finger, fine finger movements normal REFLEXES: deep tendon reflexes present and symmetric GAIT/STATION: narrow based gait; able to walk on toes, heels and tandem; romberg is  negative    DIAGNOSTIC DATA (LABS, IMAGING, TESTING) - I reviewed patient records, labs, notes, testing and imaging myself where available.  Lab Results  Component Value Date   WBC 10.8* 08/13/2013   HGB 15.8 08/13/2013   HCT 46.5 08/13/2013   MCV 80.2 08/13/2013   PLT 302 08/13/2013      Component Value Date/Time   NA 141 08/13/2013 1155   K 4.6 08/13/2013 1155   CL 104 08/13/2013 1155   CO2 21 08/13/2013 1155   GLUCOSE 101* 08/13/2013 1155   BUN 9 08/13/2013 1155   CREATININE 0.67 08/13/2013 1155   CALCIUM 9.6 08/13/2013 1155   PROT 7.8 08/13/2013 1155   ALBUMIN 4.3 08/13/2013 1155   AST 22 08/13/2013 1155   ALT 21 08/13/2013 1155   ALKPHOS 58 08/13/2013 1155   BILITOT 1.1 08/13/2013 1155   GFRNONAA >90 08/13/2013 1155   GFRAA >90 08/13/2013 1155   No results found for this basename: CHOL, HDL, LDLCALC, LDLDIRECT, TRIG, CHOLHDL   No results found for this basename: HGBA1C   No results found for this basename: VITAMINB12   No results found for this basename: TSH    I reviewed images myself and agree with interpretation, except that there are also several infratentorial plaques noted in the pons and cerebellum. Multiple T1 black holes also noted. -VRP  08/13/13 MRI brain (without) - Findings consistent with widespread supratentorial fulminant acute multiple sclerosis. No dominant lesion to suggest tumefactive MS.      ASSESSMENT AND PLAN  28 y.o. year old male here with suspected multiple sclerosis, with symptoms dating back to 2010. Will check other testing for ddx evaluation. Discussed diagnosis, prognosis and treatment options.  PLAN: - MRI testing - lab testing - return in 3 weeks to discuss results and treatment options   Orders Placed This Encounter  Procedures  . MR Brain W Wo Contrast  . MR Cervical Spine W Wo Contrast  . MR Thoracic Spine W Wo Contrast  . ANA w/Reflex if Positive  . Pan-ANCA  . Sedimentation Rate  . C-reactive Protein  . NMO IgG Autoantibodies  . HIV antibody  .  Hep B Surface Antibody  . Hep B Surface Antigen  . Hep C Antibody  . Hepatitis B Core AB, Total  . Vit D  25 hydroxy (rtn osteoporosis monitoring)  . Angiotensin converting enzyme  . Stratify JCV Antibody Test ([facility])  . RPR  . Lyme, Total Ab Test/Reflex  . Visual evoked potential test    No orders of the defined types were placed in this encounter.    Return in about 3 weeks (around 09/14/2013).    Suanne MarkerVIKRAM R. Milarose Savich, MD 08/24/2013, 10:29 AM Certified in Neurology, Neurophysiology and Neuroimaging  [facility] [address], [address] [place], [place] [address] [phone]

## 2013-08-24 NOTE — Patient Instructions (Signed)
I will check additional testing. 

## 2013-08-26 LAB — PAN-ANCA: Myeloperoxidase Ab: <9 U/mL (ref 0.0–9.0)

## 2013-08-26 LAB — LYME, TOTAL AB TEST/REFLEX

## 2013-08-26 LAB — HEPATITIS B SURFACE ANTIGEN: Hepatitis B Surface Ag: NEGATIVE

## 2013-08-26 LAB — HEPATITIS B CORE ANTIBODY, TOTAL: HEP B C TOTAL AB: NEGATIVE

## 2013-08-26 LAB — VITAMIN D 25 HYDROXY (VIT D DEFICIENCY, FRACTURES): Vit D, 25-Hydroxy: 8.6 ng/mL — ABNORMAL LOW (ref 30.0–100.0)

## 2013-08-26 LAB — ANGIOTENSIN CONVERTING ENZYME: Angio Convert Enzyme: 54 U/L (ref 14–82)

## 2013-08-26 LAB — RPR: RPR: NONREACTIVE

## 2013-08-26 LAB — ANA W/REFLEX IF POSITIVE: Anti Nuclear Antibody(ANA): NEGATIVE

## 2013-08-26 LAB — HIV ANTIBODY (ROUTINE TESTING W REFLEX): HIV-1/HIV-2 Ab: NONREACTIVE

## 2013-08-26 LAB — C-REACTIVE PROTEIN: CRP: 2 mg/L (ref 0.0–4.9)

## 2013-08-26 LAB — HEPATITIS C ANTIBODY: HEP C VIRUS AB: 0.1 {s_co_ratio} (ref 0.0–0.9)

## 2013-08-26 LAB — HEPATITIS B SURFACE ANTIBODY,QUALITATIVE: Hep B Surface Ab, Qual: REACTIVE

## 2013-08-26 LAB — NMO IGG AUTOANTIBODIES

## 2013-08-26 LAB — SEDIMENTATION RATE: SED RATE: 14 mm/h (ref 0–15)

## 2013-08-27 ENCOUNTER — Ambulatory Visit (INDEPENDENT_AMBULATORY_CARE_PROVIDER_SITE_OTHER): Payer: 59

## 2013-08-27 DIAGNOSIS — R531 Weakness: Secondary | ICD-10-CM

## 2013-08-27 DIAGNOSIS — R200 Anesthesia of skin: Secondary | ICD-10-CM

## 2013-08-27 DIAGNOSIS — R9089 Other abnormal findings on diagnostic imaging of central nervous system: Secondary | ICD-10-CM

## 2013-08-27 DIAGNOSIS — G35 Multiple sclerosis: Secondary | ICD-10-CM

## 2013-08-27 DIAGNOSIS — M62838 Other muscle spasm: Secondary | ICD-10-CM

## 2013-08-27 DIAGNOSIS — R930 Abnormal findings on diagnostic imaging of skull and head, not elsewhere classified: Secondary | ICD-10-CM

## 2013-09-03 NOTE — Procedures (Signed)
   [facility]  VEP (VISUAL EVOKED POTENTIAL) REPORT   STUDY DATE: 08/27/13 PATIENT NAME: William Fitzgerald DOB: Feb 14, 1986 MRN: [id number]  ORDERING CLINICIAN: Joycelyn Schmid, MD   TECHNOLOGIST: Gearldine Shown TECHNIQUE: The visual evoked potential test was performed using 32 x 32 check sizes with full pattern reversal. CLINICAL INFORMATION: 28 year old male with possible demyelinating disease.  FINDINGS: The visual acuity was 20/40 OD and 20/30 OS.  There are well formed evoked potential wave forms bilaterally.   P100 latency with right eye stimulation: 113 ms.   P100 latency with left eye stimulation: 116 ms.  The amplitudes for the P100 waveforms were also within normal limits bilaterally.   IMPRESSION: This visual evoked potential study is normal. No definite evidence of conduction problems of the visual pathways at this time.   INTERPRETING PHYSICIAN:  Suanne Marker, MD Certified in Neurology, Neurophysiology and Neuroimaging  [facility] [address] [place], [place] [address] [phone]

## 2013-09-04 ENCOUNTER — Ambulatory Visit
Admission: RE | Admit: 2013-09-04 | Discharge: 2013-09-04 | Disposition: A | Discharge status: Home / Self Care | Payer: 59 | Source: Ambulatory Visit | Attending: Diagnostic Neuroimaging | Admitting: Diagnostic Neuroimaging

## 2013-09-04 DIAGNOSIS — R531 Weakness: Secondary | ICD-10-CM

## 2013-09-04 DIAGNOSIS — R200 Anesthesia of skin: Secondary | ICD-10-CM

## 2013-09-04 DIAGNOSIS — G35 Multiple sclerosis: Secondary | ICD-10-CM

## 2013-09-04 DIAGNOSIS — M62838 Other muscle spasm: Secondary | ICD-10-CM

## 2013-09-04 DIAGNOSIS — R9089 Other abnormal findings on diagnostic imaging of central nervous system: Secondary | ICD-10-CM

## 2013-09-04 MED ORDER — GADOBENATE DIMEGLUMINE 529 MG/ML IV SOLN
20.0000 mL | Freq: Once | INTRAVENOUS | Status: AC | PRN
  Administered 2013-09-04: 20 mL via INTRAVENOUS

## 2013-09-18 ENCOUNTER — Other Ambulatory Visit: Payer: Self-pay | Admitting: *Deleted

## 2013-09-18 ENCOUNTER — Ambulatory Visit (INDEPENDENT_AMBULATORY_CARE_PROVIDER_SITE_OTHER): Payer: 59 | Admitting: Diagnostic Neuroimaging

## 2013-09-18 ENCOUNTER — Encounter: Payer: Self-pay | Admitting: Diagnostic Neuroimaging

## 2013-09-18 ENCOUNTER — Ambulatory Visit: Payer: 59 | Admitting: Diagnostic Neuroimaging

## 2013-09-18 ENCOUNTER — Encounter (INDEPENDENT_AMBULATORY_CARE_PROVIDER_SITE_OTHER): Payer: Self-pay

## 2013-09-18 VITALS — BP 129/81 | HR 97 | Ht 78.0 in | Wt 365.0 lb

## 2013-09-18 DIAGNOSIS — G35 Multiple sclerosis: Secondary | ICD-10-CM

## 2013-09-18 NOTE — Patient Instructions (Signed)
I will start process to start you on gilenya.  I will setup steroid treatment for next week.

## 2013-09-18 NOTE — Progress Notes (Signed)
This encounter was created in error - please disregard.

## 2013-09-18 NOTE — Progress Notes (Signed)
[facility]  PATIENT: William Fitzgerald DOB: [birth year]  REFERRING CLINICIAN: Zackowski HISTORY FROM: patient  REASON FOR VISIT: new consult   HISTORICAL  CHIEF COMPLAINT:  Chief Complaint  Patient presents with  . Follow-up    MS    HISTORY OF PRESENT ILLNESS:   UPDATE 09/18/13: Since last visit, no new neuro symptoms. Some fluctuation of right arm/leg numbness and weakness. Testing results reviewed.  PRIOR HPI (08/24/13): 28 year old right-handed male here for evaluation of possible multiple sclerosis.  2010 patient had intermittent right leg stiffness and weakness. He was diagnosed with "blood clot" in his right leg, not treated with anticoagulation. He recently had laser vein removal on this leg. In  2.5 weeks ago patient noted right arm and right leg weakness and numbness. He's also having dizziness when he gets out of bed. He's having more balance difficulty. Patient symptoms have gradually worsened. Patient went to the emergency room for evaluation, had MRI of the brain which showed multiple supratentorial and infratentorial white matter lesions, suspicious for multiple sclerosis. Patient referred to me for further evaluation.  No family history of multiple sclerosis. No episodes of unilateral visual loss. No slurred speech or trouble talking. No bowel or bladder incontinence.   REVIEW OF SYSTEMS: Full 14 system review of systems performed and notable only for weakness dizziness and sleepiness muscle spasms.  ALLERGIES: No Known Allergies  HOME MEDICATIONS: Outpatient Prescriptions Prior to Visit  Medication Sig Dispense Refill  . aspirin 325 MG tablet Take 650 mg by mouth every 6 (six) hours as needed for mild pain.       No facility-administered medications prior to visit.    PAST MEDICAL HISTORY: Past Medical History  Diagnosis Date  . DVT (deep venous thrombosis)     PAST SURGICAL HISTORY: Past Surgical History  Procedure Laterality  Date  . Vascular surgery      FAMILY HISTORY: Family History  Problem Relation Age of Onset  . Lung cancer Maternal Aunt   . Diabetes Maternal Grandfather   . Lung cancer Maternal Aunt     SOCIAL HISTORY:  History   Social History  . Marital Status: Single    Spouse Name: N/A    Number of Children: 0  . Years of Education: 12th   Occupational History  .      [employer]   Social History Main Topics  . Smoking status: Never Smoker   . Smokeless tobacco: Never Used  . Alcohol Use: Yes     Comment: occasional  . Drug Use: No     Comment: quit marijuana 2014  . Sexual Activity: Not Currently   Other Topics Concern  . Not on file   Social History Narrative   Patient lives at home with family.   Caffeine Use: tea occasionally     PHYSICAL EXAM  Filed Vitals:   09/18/13 1318  BP: 129/81  Pulse: 97  Height: 6' 6"  (1.981 m)  Weight: 365 lb (165.563 kg)    Not recorded    Body mass index is 42.19 kg/(m^2).  GENERAL EXAM: Patient is in no distress; well developed, nourished and groomed; neck is supple; NO LHERMITTE'S SIGN  CARDIOVASCULAR: Regular rate and rhythm, no murmurs, no carotid bruits  NEUROLOGIC: MENTAL STATUS: awake, alert, oriented to person, place and time, recent and remote memory intact, normal attention and concentration, language fluent, comprehension intact, naming intact, fund of knowledge appropriate CRANIAL NERVE: no papilledema on fundoscopic exam, pupils equal and reactive  to light, visual fields full to confrontation, extraocular muscles intact, no nystagmus, DECR RIGHT FACE TEMP SENSATION; facial strength symmetric, hearing intact, palate elevates symmetrically, uvula midline, shoulder shrug symmetric, tongue midline. MOTOR: normal bulk; SLIGHTLY INCR TONE IN RUE AND RLE. Full strength in the BUE, BLE SENSORY: normal and symmetric to light touch, pinprick, temperature, vibration COORDINATION: finger-nose-finger, fine finger  movements normal REFLEXES: deep tendon reflexes present and symmetric GAIT/STATION: narrow based gait; romberg is negative    DIAGNOSTIC DATA (LABS, IMAGING, TESTING) - I reviewed patient records, labs, notes, testing and imaging myself where available.  Lab Results  Component Value Date   WBC 10.8* 08/13/2013   HGB 15.8 08/13/2013   HCT 46.5 08/13/2013   MCV 80.2 08/13/2013   PLT 302 08/13/2013      Component Value Date/Time   NA 141 08/13/2013 1155   K 4.6 08/13/2013 1155   CL 104 08/13/2013 1155   CO2 21 08/13/2013 1155   GLUCOSE 101* 08/13/2013 1155   BUN 9 08/13/2013 1155   CREATININE 0.67 08/13/2013 1155   CALCIUM 9.6 08/13/2013 1155   PROT 7.8 08/13/2013 1155   ALBUMIN 4.3 08/13/2013 1155   AST 22 08/13/2013 1155   ALT 21 08/13/2013 1155   ALKPHOS 58 08/13/2013 1155   BILITOT 1.1 08/13/2013 1155   GFRNONAA >90 08/13/2013 1155   GFRAA >90 08/13/2013 1155   No results found for this basename: CHOL,  HDL,  LDLCALC,  LDLDIRECT,  TRIG,  CHOLHDL   No results found for this basename: HGBA1C   No results found for this basename: VITAMINB12   No results found for this basename: TSH    I reviewed images myself and agree with interpretation. -VRP  08/13/13 MRI brain (without) - Findings consistent with widespread supratentorial fulminant acute multiple sclerosis. No dominant lesion to suggest tumefactive MS.    09/04/13 MRI brain (with and without) - Multiple acute and chronic, supratentorial and infratentorial chronic demyelinating plaques. No significant change from MRI on 08/13/13, although prior study did not include post-contrast views.  09/04/13 MRI cervical spine (with and without) - normal  09/04/13 MRI thoracic spine (with and without) - normal  08/27/13 Visual Evoked Potentials - normal  08/24/13 Labs: ANA. ANCA. ESR, CRP, NMO, HIV, Hep B, Hep C, ACE, RPR, Lyme Ab - all negative  Vit D, 25-Hydroxy  Date Value Ref Range Status  08/24/2013 8.6* 30.0 - 100.0 ng/mL Final   08/24/13 JCV antibody / index -  2.31 (H) POSITIVE   ASSESSMENT AND PLAN  28 y.o. year old male here with new diagnosis of multiple sclerosis, with symptoms dating back to 2010. Confirmatory and rule out testing completed. Discussed diagnosis, prognosis and treatment options. He is young, with aggressive MRI appearance of MS. I would favor gilenya, tecfidera or tysabri. Given clinical consideration, patient involvement, and JCV positive status, we decided on gilenya. I do not think platform therapy (interferons or copaxone) is appropriate at this time for this patient, as it may delay getting his aggressive MS under control and lead to more disability.  PLAN: - start process to start [place]; forms signed and benefits/risks reviewed - start vitamin D 2000 units daily; repeat Vit D level in 3 months - solumedrol IV vs oral prednisone next week (because of enhancing lesions, fluctuating symptoms)  Orders Placed This Encounter  Procedures  . CBC With differential/Platelet  . Hepatic function panel  . Varicella Zoster Antibody, IgG   Return in about 3 months (around 12/19/2013).  Penni Bombard, MD 7/58/8325, 4:98 PM Certified in Neurology, Neurophysiology and Neuroimaging  [facility] [address], [place], [place] [address] [phone]

## 2013-09-18 NOTE — Addendum Note (Signed)
Addended by: Doree BarthelLOWE, CASANDRA on: 09/18/2013 03:56 PM   Modules accepted: Orders

## 2013-09-19 LAB — CBC WITH DIFFERENTIAL
BASOS: 1 %
Basophils Absolute: 0.1 10*3/uL (ref 0.0–0.2)
EOS ABS: 0.3 10*3/uL (ref 0.0–0.4)
Eos: 3 %
HEMATOCRIT: 45 % (ref 37.5–51.0)
HEMOGLOBIN: 15.1 g/dL (ref 12.6–17.7)
Immature Grans (Abs): 0 10*3/uL (ref 0.0–0.1)
Immature Granulocytes: 0 %
Lymphocytes Absolute: 1.5 10*3/uL (ref 0.7–3.1)
Lymphs: 17 %
MCH: 26.6 pg (ref 26.6–33.0)
MCHC: 33.6 g/dL (ref 31.5–35.7)
MCV: 79 fL (ref 79–97)
Monocytes Absolute: 0.7 10*3/uL (ref 0.1–0.9)
Monocytes: 8 %
NEUTROS ABS: 6.4 10*3/uL (ref 1.4–7.0)
Neutrophils Relative %: 71 %
Platelets: 393 10*3/uL — ABNORMAL HIGH (ref 150–379)
RBC: 5.68 x10E6/uL (ref 4.14–5.80)
RDW: 14 % (ref 12.3–15.4)
WBC: 8.9 10*3/uL (ref 3.4–10.8)

## 2013-09-19 LAB — HEPATIC FUNCTION PANEL
ALBUMIN: 4.5 g/dL (ref 3.5–5.5)
ALK PHOS: 60 IU/L (ref 39–117)
ALT: 16 IU/L (ref 0–44)
AST: 21 IU/L (ref 0–40)
Bilirubin, Direct: 0.32 mg/dL (ref 0.00–0.40)
TOTAL PROTEIN: 7.1 g/dL (ref 6.0–8.5)
Total Bilirubin: 1.6 mg/dL — ABNORMAL HIGH (ref 0.0–1.2)

## 2013-09-19 LAB — VARICELLA ZOSTER ANTIBODY, IGG: VARICELLA: 1084 {index} (ref >165)

## 2013-09-23 ENCOUNTER — Telehealth: Payer: Self-pay | Admitting: *Deleted

## 2013-09-23 NOTE — Telephone Encounter (Signed)
Deb called about needing signature on order for solumedrol infusion.   She had gotten in touch with pt about getting infusion. Signed order faxed [phone]. Confirmed.

## 2013-09-29 ENCOUNTER — Telehealth: Payer: Self-pay | Admitting: *Deleted

## 2013-09-29 NOTE — Telephone Encounter (Signed)
Pt's information was given to [place], [place], Dr. Richrd HumblesPenumalli's nurse. Please advise

## 2013-09-29 NOTE — Telephone Encounter (Signed)
Patient calling to state that he is not comfortable with the medication process that Dr. Marjory Lies has suggested. If questions, please call patient and advise.

## 2013-09-29 NOTE — Telephone Encounter (Signed)
I called and LMVM for him to return call.

## 2013-10-02 ENCOUNTER — Telehealth: Payer: Self-pay

## 2013-10-02 NOTE — Telephone Encounter (Signed)
[employer] sent us a correspondence stating they have denied our request for coverage on Gilenya.  They indicate the patient must have a documented trial (of at least 4 weeks) on one of the following preferred drugs: Tecfidera, Avonex, Betaseron or Copaxone.  Would you like to change to a formulary alternative.  File # [id number] (Also the previous phone note says patient refused eye appt saying he is not comfortable taking the medication)   Please advise.  Thank you.

## 2013-10-02 NOTE — Telephone Encounter (Signed)
I called patient. No answer. Recommend tecfidera as second option. Will try to reach patient to discuss. -VRP

## 2013-10-02 NOTE — Telephone Encounter (Signed)
Have not heard.  Consulting with Dr. Marjory Lies about other options.

## 2013-10-13 ENCOUNTER — Telehealth: Payer: Self-pay | Admitting: Diagnostic Neuroimaging

## 2013-10-13 NOTE — Telephone Encounter (Signed)
William Fitzgerald with Dr Verl Dicker office calling to inform Dr Marjory Lies patient was a No Show today.  Attempted to reach patient on several occassions.  If want to reschedule appointment please call.  [phone].

## 2013-10-14 NOTE — Telephone Encounter (Signed)
Spoke to patient. He said he did not go to his appt with Dr. Jacinto Halim because he does not want to go through with the Gilenya process. He said he would rather go a more natural route. Tried to confirm f/u visit in August w/ our office. Patient said he is not sure he will be able to come because he might go out of town. He stated he would contact us to reschedule if he does.

## 2013-12-22 ENCOUNTER — Ambulatory Visit: Payer: 59 | Admitting: Diagnostic Neuroimaging

## 2014-03-09 ENCOUNTER — Emergency Department (HOSPITAL_COMMUNITY)
Admission: EM | Admit: 2014-03-09 | Discharge: 2014-03-09 | Disposition: A | Discharge status: Home / Self Care | Payer: 59 | Source: Home / Self Care | Attending: Family Medicine | Admitting: Family Medicine

## 2014-03-09 ENCOUNTER — Encounter (HOSPITAL_COMMUNITY): Payer: Self-pay | Admitting: Emergency Medicine

## 2014-03-09 DIAGNOSIS — G35 Multiple sclerosis: Secondary | ICD-10-CM

## 2014-03-09 LAB — GLUCOSE, CAPILLARY: Glucose-Capillary: 107 mg/dL — ABNORMAL HIGH (ref 70–99)

## 2014-03-09 NOTE — Discharge Instructions (Signed)
You are to follow up with your neurologist, Dr. [name], tomorrow 03/10/2014 at 2:15 pm. You are not to drive until cleared by your neurologist to do so. If symptoms become suddenly worse or severe, you are to seek treatment at your nearest emergency room.

## 2014-03-09 NOTE — ED Provider Notes (Signed)
CSN: [id number]     Arrival date & time 03/09/14  0846 History   First MD Initiated Contact with Patient 03/09/14 [phone]     Chief Complaint  Patient presents with  . Blurred Vision   (Consider location/radiation/quality/duration/timing/severity/associated sxs/prior Treatment) HPI Comments: Patient presents for evaluation of one week of ataxia, right upper extremity fine motor skill difficulties with respect to right hand and blurred vision. Denies recent illness or injury. No polyuria or polydipsia. No bowel or bladder incontinence. 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  Reports previous evaluation with neurologist for right sided numbness(Dr. Penumali) and patient's understanding after visit (in May 2015) was that he has a Vitamin D deficiency.   The history is provided by the patient.    Past Medical History  Diagnosis Date  . DVT (deep venous thrombosis)    Past Surgical History  Procedure Laterality Date  . Vascular surgery     Family History  Problem Relation Age of Onset  . Lung cancer Maternal Aunt   . Diabetes Maternal Grandfather   . Lung cancer Maternal Aunt    History  Substance Use Topics  . Smoking status: Never Smoker   . Smokeless tobacco: Never Used  . Alcohol Use: Yes     Comment: occasional    Review of Systems  Constitutional: Negative.   HENT: Negative.   Eyes: Positive for visual disturbance.  Respiratory: Negative.   Cardiovascular: Negative.   Gastrointestinal: Negative.   Endocrine: Negative for polydipsia, polyphagia and polyuria.  Musculoskeletal: Negative.   Skin: Negative.   Neurological: Positive for dizziness. Negative for seizures, syncope, speech difficulty, light-headedness, numbness and headaches.    Allergies  Review of patient's allergies indicates no known allergies.  Home Medications   Prior to Admission medications   Medication Sig Start Date End Date Taking? Authorizing  Provider  aspirin 325 MG tablet Take 650 mg by mouth every 6 (six) hours as needed for mild pain.    Historical Provider, MD   BP 130/78 mmHg  Pulse 76  Temp(Src) 98 F (36.7 C) (Oral)  Resp 16  SpO2 100% Physical Exam  Constitutional: He is oriented to person, place, and time. He appears well-developed and well-nourished. No distress.  HENT:  Head: Normocephalic and atraumatic.  Right Ear: Hearing, tympanic membrane, external ear and ear canal normal.  Left Ear: Hearing, tympanic membrane, external ear and ear canal normal.  Nose: Nose normal.  Mouth/Throat: Uvula is midline, oropharynx is clear and moist and mucous membranes are normal.  Eyes: Conjunctivae and EOM are normal. Pupils are equal, round, and reactive to light. No scleral icterus.  Neck: Normal range of motion. Neck supple.  Cardiovascular: Normal rate, regular rhythm and normal heart sounds.   Pulmonary/Chest: Effort normal and breath sounds normal. No respiratory distress. He has no wheezes.  Musculoskeletal: Normal range of motion.  Lymphadenopathy:    He has no cervical adenopathy.  Neurological: He is alert and oriented to person, place, and time. He has normal strength. No cranial nerve deficit or sensory deficit. Coordination and gait abnormal. GCS eye subscore is 4. GCS verbal subscore is 5. GCS motor subscore is 6.  +mild ataxia and past pointing with finger to nose testing of RUE. Finger to nose testing with LUE normal. +wide based, slow gate No pronator drift Patient reports feeling too unsteady to perform heel-toe gait assessment Visual fields full to confrontation, just reports diffusely blurred vision  Skin:  Skin is warm and dry. No rash noted. No erythema.  Psychiatric: He has a normal mood and affect. His behavior is normal.  Nursing note and vitals reviewed.   ED Course  Procedures (including critical care time) Labs Review Labs Reviewed  GLUCOSE, CAPILLARY - Abnormal; Notable for the following:     Glucose-Capillary 107 (*)    All other components within normal limits    Imaging Review No results found.   MDM   1. MS (multiple sclerosis)    Old records reviewed from neurology visit in May 2015 and patient received new diagnosis of MS. Multiple ring enhancing lesions on 09/04/2013 brain MRI. I suspect this patient does not understand this to be his diagnosis as he states he does not have MS and is not currently not taking medication recommended by neurology. He states he only has a Vitamin D deficiency I also suspect that his presenting symptoms are a result of MS exacerbation. I contacted [facility] and arranged for patient to be seen by Dr. [name] on 03/10/2014 @ 2:30pm. I advised patient that he does in fact have MS based upon MRI results. He tells me he does not wish to begin taking MS medications and I asked him to have discussion regarding therapy with neurologist tomorrow.  Attempted to run I-stat 8 and UA while patient at [facility] and lab tech unable to draw sufficient blood after 4 attempts and patient, despite being given several cups of water, states he cannot produce a urine specimen.  Patient advised not to drive until cleared by neurology to do so.    Ria Clock, PA 03/09/14 1003

## 2014-03-09 NOTE — ED Notes (Signed)
Patient reports blurry vision x 1 week. It is worse while he is driving. Patient reports it does not change or get better. Last saw his optometrist in January of this year. Patient is in NAD.

## 2014-03-10 ENCOUNTER — Encounter: Payer: Self-pay | Admitting: Diagnostic Neuroimaging

## 2014-03-10 ENCOUNTER — Ambulatory Visit (INDEPENDENT_AMBULATORY_CARE_PROVIDER_SITE_OTHER): Payer: 59 | Admitting: Diagnostic Neuroimaging

## 2014-03-10 VITALS — BP 139/81 | HR 84 | Temp 97.3°F | Ht 78.0 in | Wt 343.6 lb

## 2014-03-10 DIAGNOSIS — G35 Multiple sclerosis: Secondary | ICD-10-CM

## 2014-03-10 MED ORDER — PREDNISONE 10 MG PO TABS: ORAL_TABLET | ORAL | Status: DC

## 2014-03-10 NOTE — Progress Notes (Signed)
Rx has been resent.  I spoke with patient.  He is aware.

## 2014-03-10 NOTE — Telephone Encounter (Signed)
Returned patient's call. Advised would change pharmacy.

## 2014-03-10 NOTE — Telephone Encounter (Signed)
Patient calling to state that he would like his Prednisone script sent to the [facility] pharmacy on [address], please return call and advise.

## 2014-03-10 NOTE — Patient Instructions (Signed)
I will setup MRI brain.  Start prednisone tapering dose for 12 days.  Then start tecfidera in next 2-4 weeks.

## 2014-03-10 NOTE — Progress Notes (Addendum)
[facility]  PATIENT: William Fitzgerald DOB: 26-Apr-1986  REFERRING CLINICIAN: Zackowski HISTORY FROM: patient and grandmother REASON FOR VISIT: follow up   HISTORICAL  CHIEF COMPLAINT:  Chief Complaint  Patient presents with  . Follow-up    MS, balance, blurry vision, gait    HISTORY OF PRESENT ILLNESS:   UPDATE 03/10/14: Since last visit, we tried to start gilenya, then it was denied by insurance, then patient was reluctant to start. He opted to take vitamin D only and not other MS meds. 2 weeks ago, developed new onset of fatigue, bilateral blurred vision, right arm weakness, right leg numbness and balance difficulty.   UPDATE 09/18/13: Since last visit, no new neuro symptoms. Some fluctuation of right arm/leg numbness and weakness. Testing results reviewed.  PRIOR HPI (08/24/13): 28 year old right-handed male here for evaluation of possible multiple sclerosis. 2010 patient had intermittent right leg stiffness and weakness. He was diagnosed with "blood clot" in his right leg, not treated with anticoagulation. He recently had laser vein removal on this leg. 2.5 weeks ago patient noted right arm and right leg weakness and numbness. He's also having dizziness when he gets out of bed. He's having more balance difficulty. Patient symptoms have gradually worsened. Patient went to the emergency room for evaluation, had MRI of the brain which showed multiple supratentorial and infratentorial white matter lesions, suspicious for multiple sclerosis. Patient referred to me for further evaluation. No family history of multiple sclerosis. No episodes of unilateral visual loss. No slurred speech or trouble talking. No bowel or bladder incontinence.   REVIEW OF SYSTEMS: Full 14 system review of systems performed and notable only for weakness dizziness fatigue sleepiness muscle spasms.  ALLERGIES: No Known Allergies  HOME MEDICATIONS: Outpatient Prescriptions Prior to Visit    Medication Sig Dispense Refill  . aspirin 325 MG tablet Take 650 mg by mouth every 6 (six) hours as needed for mild pain.     No facility-administered medications prior to visit.    PAST MEDICAL HISTORY: Past Medical History  Diagnosis Date  . DVT (deep venous thrombosis)     PAST SURGICAL HISTORY: Past Surgical History  Procedure Laterality Date  . Vascular surgery      FAMILY HISTORY: Family History  Problem Relation Age of Onset  . Lung cancer Maternal Aunt   . Diabetes Maternal Grandfather   . Lung cancer Maternal Aunt     SOCIAL HISTORY:  History   Social History  . Marital Status: Single    Spouse Name: N/A    Number of Children: 0  . Years of Education: 12th   Occupational History  .      [employer]   Social History Main Topics  . Smoking status: Never Smoker   . Smokeless tobacco: Never Used  . Alcohol Use: Yes     Comment: occasional  . Drug Use: No     Comment: quit marijuana 2014  . Sexual Activity: Not Currently   Other Topics Concern  . Not on file   Social History Narrative   Patient lives at home with family.   Caffeine Use: tea occasionally     PHYSICAL EXAM  Filed Vitals:   03/10/14 1434  BP: 139/81  Pulse: 84  Temp: 97.3 F (36.3 C)  TempSrc: Oral  Height: _0  (1.981 m)  Weight: 343 lb 9.6 oz (155.856 kg)   Body mass index is 39.72 kg/(m^2).   Visual Acuity Screening   Right eye Left eye  Both eyes  Without correction:     With correction: 20/200 20/200     GENERAL EXAM: Patient is in no distress; well developed, nourished and groomed; neck is supple; NO LHERMITTE'S SIGN  CARDIOVASCULAR: Regular rate and rhythm, no murmurs, no carotid bruits  NEUROLOGIC: MENTAL STATUS: awake, alert, language fluent, comprehension intact, naming intact, fund of knowledge appropriate CRANIAL NERVE: no papilledema on fundoscopic exam, pupils equal and reactive to light, visual fields full to confrontation, SUBJECTIVE  DOUBLE/BLURRED VISION ON RIGHT GAZE; SACCADIC DYSMETRIA; no nystagmus, DECR RIGHT FACE TEMP SENSATION; facial strength symmetric, hearing intact, palate elevates symmetrically, uvula midline, shoulder shrug symmetric, tongue midline. MOTOR: normal bulk; SLIGHTLY INCR TONE IN RUE AND RLE. Full strength in the LUE, LLE; RUE (TRICEPS 3); RLE 4+.  SENSORY: DECR IN RIGHT LEG COORDINATION: finger-nose-finger, fine finger movements SLOW AND MILD ATAXIA IN RUE REFLEXES: deep tendon reflexes present and symmetric GAIT/STATION: narrow based gait; LIMPING ON RIGHT LEG; romberg is negative    DIAGNOSTIC DATA (LABS, IMAGING, TESTING) - I reviewed patient records, labs, notes, testing and imaging myself where available.  Lab Results  Component Value Date   WBC 8.9 09/18/2013   HGB 15.1 09/18/2013   HCT 45.0 09/18/2013   MCV 79 09/18/2013   PLT 393* 09/18/2013      Component Value Date/Time   NA 141 08/13/2013 1155   K 4.6 08/13/2013 1155   CL 104 08/13/2013 1155   CO2 21 08/13/2013 1155   GLUCOSE 101* 08/13/2013 1155   BUN 9 08/13/2013 1155   CREATININE 0.67 08/13/2013 1155   CALCIUM 9.6 08/13/2013 1155   PROT 7.1 09/18/2013 1427   PROT 7.8 08/13/2013 1155   ALBUMIN 4.3 08/13/2013 1155   AST 21 09/18/2013 1427   ALT 16 09/18/2013 1427   ALKPHOS 60 09/18/2013 1427   BILITOT 1.6* 09/18/2013 1427   GFRNONAA >90 08/13/2013 1155   GFRAA >90 08/13/2013 1155   No results found for: CHOL No results found for: HGBA1C No results found for: VITAMINB12 No results found for: TSH  I reviewed images myself and agree with interpretation. -VRP  08/13/13 MRI brain (without) - Findings consistent with widespread supratentorial fulminant acute multiple sclerosis. No dominant lesion to suggest tumefactive MS.    09/04/13 MRI brain (with and without) - Multiple acute and chronic, supratentorial and infratentorial chronic demyelinating plaques. No significant change from MRI on 08/13/13, although prior study  did not include post-contrast views.  09/04/13 MRI cervical spine (with and without) - normal  09/04/13 MRI thoracic spine (with and without) - normal  08/27/13 Visual Evoked Potentials - normal  08/24/13 Labs: ANA. ANCA. ESR, CRP, NMO, HIV, Hep B, Hep C, ACE, RPR, Lyme Ab - all negative  Vit D, 25-Hydroxy  Date Value Ref Range Status  08/24/2013 8.6* 30.0 - 100.0 ng/mL Final   08/24/13 JCV antibody / index - 2.31 (H) POSITIVE   ASSESSMENT AND PLAN  28 y.o. year old male here with new diagnosis of multiple sclerosis, with symptoms dating back to 2010. Confirmatory and rule out testing completed. Discussed diagnosis, prognosis and treatment options. He is young, with aggressive MRI appearance of MS. I would favor tecfidera or tysabri. Given clinical consideration, patient involvement, and JCV positive status, we decided on TECFIDERA. I do not think platform therapy (interferons or copaxone) is appropriate at this time for this patient, as it may delay getting his aggressive MS under control and lead to more disability.  PLAN: - start process to start [place];  forms signed and benefits/risks reviewed - start vitamin D 2000 units daily; repeat Vit D level in 3 months - oral prednisone for current flare  Orders Placed This Encounter  Procedures  . MR Brain W Wo Contrast   Return in about 1 month (around 04/09/2014).    Penni Bombard, MD 04/0/4591, 3:68 PM Certified in Neurology, Neurophysiology and Neuroimaging  [facility] [address], [place], [place] [address] [phone]

## 2014-03-10 NOTE — Telephone Encounter (Signed)
Thanks Jessica!! :) 

## 2014-03-10 NOTE — Telephone Encounter (Signed)
Rx has been resent to [facility] per patient request.  I called the patient back.  He is aware.

## 2014-03-11 DIAGNOSIS — G35 Multiple sclerosis: Secondary | ICD-10-CM

## 2014-03-15 ENCOUNTER — Telehealth: Payer: Self-pay | Admitting: Diagnostic Neuroimaging

## 2014-03-15 ENCOUNTER — Other Ambulatory Visit: Payer: Self-pay | Admitting: Neurology

## 2014-03-15 ENCOUNTER — Ambulatory Visit: Payer: 59 | Admitting: Family

## 2014-03-15 DIAGNOSIS — G35 Multiple sclerosis: Secondary | ICD-10-CM

## 2014-03-15 NOTE — Telephone Encounter (Signed)
Spoke to patient. He says he is on FMLA from work and is requesting a letter stating how long he will need to be out of work. Advised patient to get forms for FMLA to be filled out. Patient said he already has the forms, but they need to be filled out by 03/21/14. Explained our office form turn around time. Patient will speak w/ HR, but will drop forms off also.

## 2014-03-15 NOTE — Telephone Encounter (Signed)
Patient requesting letter written about him taking time off of work, please return call and advise.

## 2014-03-16 ENCOUNTER — Telehealth: Payer: Self-pay | Admitting: Neurology

## 2014-03-16 NOTE — Telephone Encounter (Signed)
I have called patient for MRI report, repeat MRI showed increased amount of enhancing lesions.  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  TECFIDERA was started.  oral prednisone for current flare  I have moved up his appt with Dr. Marjory Lies  To Nov 6th 8:30am to address all his questions.

## 2014-03-17 ENCOUNTER — Telehealth: Payer: Self-pay | Admitting: Diagnostic Neuroimaging

## 2014-03-17 NOTE — Telephone Encounter (Signed)
Patient returning someones call °

## 2014-03-17 NOTE — Telephone Encounter (Signed)
Spoke to patient. Confirmed appt on 03/22/14; arrival time 8:15am. Patient states his job is requiring a Pensions consultant letter" to be generated by our office for the time he has missed or will miss from work. He states he is applying for FMLA. I explained our office normal FMLA form process, and advised patient to have HR fax what they are requesting. Patient agreed. Jovita Gamma [phone] fax#

## 2014-03-22 ENCOUNTER — Ambulatory Visit (INDEPENDENT_AMBULATORY_CARE_PROVIDER_SITE_OTHER): Payer: 59 | Admitting: Nurse Practitioner

## 2014-03-22 ENCOUNTER — Encounter: Payer: Self-pay | Admitting: Nurse Practitioner

## 2014-03-22 ENCOUNTER — Ambulatory Visit: Payer: Self-pay | Admitting: Diagnostic Neuroimaging

## 2014-03-22 ENCOUNTER — Ambulatory Visit (INDEPENDENT_AMBULATORY_CARE_PROVIDER_SITE_OTHER): Payer: Self-pay | Admitting: Neurology

## 2014-03-22 ENCOUNTER — Telehealth: Payer: Self-pay | Admitting: *Deleted

## 2014-03-22 VITALS — BP 123/81 | HR 88 | Temp 97.9°F | Resp 18 | Ht 78.0 in | Wt 343.2 lb

## 2014-03-22 DIAGNOSIS — G35 Multiple sclerosis: Secondary | ICD-10-CM

## 2014-03-22 DIAGNOSIS — Z0289 Encounter for other administrative examinations: Secondary | ICD-10-CM

## 2014-03-22 NOTE — Progress Notes (Signed)
HISTORICAL  CHIEF COMPLAINT:  Chief Complaint  Patient presents with  . Follow-up    MRI Results    HISTORY OF PRESENT ILLNESS:  William Fitzgerald is a 28 year old right-handed African-American male,accompanied by his grandmother, came in to follow-up on his MRI result, he was patient's of Dr. Leta Baptist, last clinical visit was November fourth 2015  He has relapsing remitting multiple sclerosis, initial symptoms was in 2010, presented with right leg stiffness, weakness, balance difficulty,  MRI of the brain showed multiple supratentorium, and infratentorial lesions, he was evaluated by neurologist then for multiple sclerosis  But he was reluctant to start immunomodulation therapy, later Gilenya was tried, but was denied by his insurance,  JC virus was positive in April 2015 with titer of 2.31.  NMO antibody was negative  In October 2015, he presented with worsening bilateral blurry vision, worsening gait difficulty, right side weakness, since last visit in November fourth 2015, he was gating steroid by mouth package, with mild improvement,  Tecfidera was also started,  He was contact by the pharmaceutical company few days ago, but has not had the medication yet  He came here to review the most recent MRI of the brain in November fifth 2015, done at [facility], in comparison to previous MRI scan in May 2015, at [facility],  We have reviewed MRI together, showing multiple supratentorial (periventricular, subcortical, juxtacortical, thalamic, basal ganglia) and infratentorial (midbrain, pontine, cerebellar) T2 hyperintensities, consistent with both active and chronic demyelinating plaques.multiple enhancing lesions are noted. Compared with previous MRI dated 09/04/2013 there appears to be increase in overall acute and chronic disease activity with a new large right posterior frontal and enlarged right cerebellar cystic ring-enhancing lesion noted.  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 aspirin 325 MG tablet Take 650 mg by mouth every 6 (six) hours as needed for mild pain.    . predniSONE (DELTASONE) 10 MG tablet Take 38m daily x 2 days. Then reduce by 183mevery 2 days. (60, 60, 50, 50, 40, 40, 30, 30, 20, 20, 10, 10, then stop). 42 tablet 0   No facility-administered medications prior to visit.    PAST MEDICAL HISTORY: Past Medical History  Diagnosis Date  . DVT (deep venous thrombosis)     PAST SURGICAL HISTORY: Past Surgical History  Procedure Laterality Date  . Vascular surgery      FAMILY HISTORY: Family History  Problem Relation Age of Onset  . Lung cancer Maternal Aunt   . Diabetes Maternal Grandfather   . Lung cancer Maternal Aunt     SOCIAL HISTORY:  History   Social History  . Marital Status: Single    Spouse Name: N/A    Number of Children: 0  . Years of Education: 12th   Occupational History  .      [employer]   Social History Main Topics  . Smoking status: Never Smoker   . Smokeless tobacco: Never Used  . Alcohol Use: Yes     Comment: occasional  . Drug Use: No     Comment: quit marijuana 2014  . Sexual Activity: Not Currently   Other Topics Concern  . Not on file   Social History Narrative   Patient lives at  home with family.   Caffeine Use: tea occasionally     PHYSICAL EXAM  Filed Vitals:   03/22/14 1322  BP: 123/81  Pulse: 88  Temp: 97.9 F (36.6 C)  TempSrc: Oral  Resp: 18  Height: 6' 6"  (1.981 m)  Weight: 343 lb 3.2 oz (155.674 kg)   PHYSICAL EXAMINATOINS:  Generalized: In no acute distress  Neck: Supple, no carotid bruits   Cardiac: Regular rate rhythm  Pulmonary: Clear to auscultation bilaterally  Musculoskeletal: No  deformity  Neurological examination  Mentation: Alert oriented to time, place, history taking, and causual conversation, tall man  Cranial nerve II-XII: Pupils were equal round reactive to light extraocular movements were full, visual field were full on confrontational test.   He could read moderate size print, but could not read small print Bilateral fundi were sharp  Facial sensation and strength were normal. hearing was intact to finger rubbing bilaterally. Uvula tongue midline.  head turning and shoulder shrug and were normal and symmetric.Tongue protrusion into cheek strength was normal.   Motor: mild right hip flexion weakness,.  Sensory: Intact to fine touch, pinprick, preserved vibratory sensation, and proprioception at toes.  Coordination: Normal finger to nose, heel-to-shin bilaterally there was no truncal ataxia  Gait: cautious, wide-based gait, dragging right leg  Romberg signs: Negative  Deep tendon reflexes: Brachioradialis 2/2, biceps 2/2, triceps 2/2, patellar 3/3, Achilles 2/2, plantar responses were flexor bilaterally.      DIAGNOSTIC DATA (LABS, IMAGING, TESTING) - I reviewed patient records, labs, notes, testing and imaging myself where available.  Lab Results  Component Value Date   WBC 8.9 09/18/2013   HGB 15.1 09/18/2013   HCT 45.0 09/18/2013   MCV 79 09/18/2013   PLT 393* 09/18/2013      Component Value Date/Time   NA 141 08/13/2013 1155   K 4.6 08/13/2013 1155   CL 104 08/13/2013 1155   CO2 21 08/13/2013 1155   GLUCOSE 101* 08/13/2013 1155   BUN 9 08/13/2013 1155   CREATININE 0.67 08/13/2013 1155   CALCIUM 9.6 08/13/2013 1155   PROT 7.1 09/18/2013 1427   PROT 7.8 08/13/2013 1155   ALBUMIN 4.3 08/13/2013 1155   AST 21 09/18/2013 1427   ALT 16 09/18/2013 1427   ALKPHOS 60 09/18/2013 1427   BILITOT 1.6* 09/18/2013 1427   GFRNONAA >90 08/13/2013 1155   GFRAA >90 08/13/2013 1155     08/13/13 MRI brain (without) - Findings consistent with  widespread supratentorial fulminant acute multiple sclerosis. No dominant lesion to suggest tumefactive MS.    09/04/13 MRI brain (with and without) - Multiple acute and chronic, supratentorial and infratentorial chronic demyelinating plaques. No significant change from MRI on 08/13/13, although prior study did not include post-contrast views.  09/04/13 MRI cervical spine (with and without) - normal  09/04/13 MRI thoracic spine (with and without) - normal  08/27/13 Visual Evoked Potentials - normal  08/24/13 Labs: ANA. ANCA. ESR, CRP, NMO, HIV, Hep B, Hep C, ACE, RPR, Lyme Ab - all negative  Vit D, 25-Hydroxy  Date Value Ref Range Status  08/24/2013 8.6* 30.0 - 100.0 ng/mL Final   08/24/13 JCV antibody / index - 2.31 (H) POSITIVE   ASSESSMENT AND PLAN  28 y.o. year old male here with relapsing remitting multiple sclerosis, He is young, with aggressive MRI appearance of MS. JC virus positive, with titer 2.3.  Repeat MRI of the brain 6 month apart, showed significant worsening, there was evidence of clinical flareups   1. Continue  Tecfidera as previously planned, 2, return to clinic with Dr. Leta Baptist in December 22nd as planned,   Marcial Pacas [facility] [address], [place], [place] [address] [phone]

## 2014-03-22 NOTE — Telephone Encounter (Signed)
Form,Fmla [employer] to [place]andra and Dr Marjory LiesPenumalli to be completed 03-22-14.

## 2014-03-22 NOTE — Progress Notes (Signed)
Please see note dated Nov 16th 2015 under Darrol Angelarolyn Martin,

## 2014-03-22 NOTE — Telephone Encounter (Signed)
Jessica:  Please check on the status of his Tecfidera process.

## 2014-03-22 NOTE — Telephone Encounter (Signed)
I called Tecfidera at [phone].  Spoke with Misty Stanley.  She said they had everything needed from [place], and triaged the Rx to [facility] on 11/12.  She provided a contact number for them of [phone].  I called them.  Spoke with Ramona.  She reviewed the file and said they have the Rx, and nothing further is needed from [place].  They are just waiting for the patient to call them back to complete order.  I called the patient.  Relayed this info.  Says he will call the pharmacy and will call us back if needed.

## 2014-03-25 ENCOUNTER — Telehealth: Payer: Self-pay | Admitting: Diagnostic Neuroimaging

## 2014-03-25 NOTE — Telephone Encounter (Signed)
Patient requesting FMLA Paperwork from [employer] faxed [phone], Attention Sherilyn CooterHenry.  Please call and advise.

## 2014-03-25 NOTE — Patient Instructions (Addendum)
Placed form in Dr. Richrd Humbles inbox in office.

## 2014-03-25 NOTE — Telephone Encounter (Signed)
Spoke to patient. Advised received message. Forms placed in Dr. Richrd Humbles inbox to be completed. Will fax once completed. Patient agreed.

## 2014-04-16 ENCOUNTER — Telehealth: Payer: Self-pay | Admitting: Neurology

## 2014-04-16 NOTE — Telephone Encounter (Signed)
Dr Terrace Arabia,     I called [facility] at [phone]. Spoke with Misty Stanley. She said they had everything needed from [place], and triaged the Rx to [facility] on 11/12. She provided a contact number for them of [phone]. I called them. Spoke with Ramona. She reviewed the file and said they have the Rx, and nothing further is needed from [place]. They are just waiting for the patient to call them back to complete order. I called the patient. Relayed this info. Says he will call the pharmacy and will call us back if needed.    Thank you.     ----- Message -----     From: Levert Feinstein, MD     Sent: 03/22/2014  2:53 PM      To: Lucille Passy, CPHT

## 2014-04-27 ENCOUNTER — Encounter: Payer: Self-pay | Admitting: Diagnostic Neuroimaging

## 2014-04-27 ENCOUNTER — Ambulatory Visit (INDEPENDENT_AMBULATORY_CARE_PROVIDER_SITE_OTHER): Payer: 59 | Admitting: Diagnostic Neuroimaging

## 2014-04-27 ENCOUNTER — Telehealth: Payer: Self-pay | Admitting: Diagnostic Neuroimaging

## 2014-04-27 VITALS — BP 135/79 | HR 93 | Ht 78.0 in | Wt 339.2 lb

## 2014-04-27 DIAGNOSIS — G35 Multiple sclerosis: Secondary | ICD-10-CM

## 2014-04-27 NOTE — Patient Instructions (Signed)
Return in 1 month for lab testing.  Return for office visit in 2 months.

## 2014-04-27 NOTE — Telephone Encounter (Signed)
William Fitzgerald with [employer] is calling regarding the return to work letter they had received. Are there any restrictions for patient? Please fax letter advising this to [phone]. Thank you.

## 2014-04-27 NOTE — Progress Notes (Signed)
[facility]  PATIENT: William Fitzgerald DOB: 1986/05/03  REFERRING CLINICIAN: Zackowski HISTORY FROM: patient and grandmother REASON FOR VISIT: follow up   HISTORICAL  CHIEF COMPLAINT:  Chief Complaint  Patient presents with  . Follow-up    MS    HISTORY OF PRESENT ILLNESS:   UPDATE 04/27/14: Since last visit, now on tecfidera for past 3 weeks, no side effects. Wants to return to work. Overall vision, strength, numbness are improving.  UPDATE 03/10/14: Since last visit, we tried to start gilenya, then it was denied by insurance, then patient was reluctant to start. He opted to take vitamin D only and not other MS meds. 2 weeks ago, developed new onset of fatigue, bilateral blurred vision, right arm weakness, right leg numbness and balance difficulty.   UPDATE 09/18/13: Since last visit, no new neuro symptoms. Some fluctuation of right arm/leg numbness and weakness. Testing results reviewed.  PRIOR HPI (08/24/13): 28 year old right-handed male here for evaluation of possible multiple sclerosis. 2010 patient had intermittent right leg stiffness and weakness. He was diagnosed with "blood clot" in his right leg, not treated with anticoagulation. He recently had laser vein removal on this leg. 2.5 weeks ago patient noted right arm and right leg weakness and numbness. He's also having dizziness when he gets out of bed. He's having more balance difficulty. Patient symptoms have gradually worsened. Patient went to the emergency room for evaluation, had MRI of the brain which showed multiple supratentorial and infratentorial white matter lesions, suspicious for multiple sclerosis. Patient referred to me for further evaluation. No family history of multiple sclerosis. No episodes of unilateral visual loss. No slurred speech or trouble talking. No bowel or bladder incontinence.   REVIEW OF SYSTEMS: Full 14 system review of systems performed and notable only for weakness  headache.   ALLERGIES: No Known Allergies  HOME MEDICATIONS: No outpatient prescriptions prior to visit.   No facility-administered medications prior to visit.    PAST MEDICAL HISTORY: Past Medical History  Diagnosis Date  . DVT (deep venous thrombosis)     PAST SURGICAL HISTORY: Past Surgical History  Procedure Laterality Date  . Vascular surgery      FAMILY HISTORY: Family History  Problem Relation Age of Onset  . Lung cancer Maternal Aunt   . Diabetes Maternal Grandfather   . Lung cancer Maternal Aunt     SOCIAL HISTORY:  History   Social History  . Marital Status: Single    Spouse Name: N/A    Number of Children: 0  . Years of Education: 12th   Occupational History  .      [employer]   Social History Main Topics  . Smoking status: Never Smoker   . Smokeless tobacco: Never Used  . Alcohol Use: Yes     Comment: occasional  . Drug Use: No     Comment: quit marijuana 2014  . Sexual Activity: Not Currently   Other Topics Concern  . Not on file   Social History Narrative   Patient lives at home with family.   Caffeine Use: tea occasionally     PHYSICAL EXAM  Filed Vitals:   04/27/14 0949  BP: 135/79  Pulse: 93  Height: 6' 6"  (1.981 m)  Weight: 339 lb 3.2 oz (153.86 kg)   Body mass index is 39.21 kg/(m^2).  No exam data present  GENERAL EXAM: Patient is in no distress; well developed, nourished and groomed; neck is supple; NO LHERMITTE'S SIGN  CARDIOVASCULAR: Regular  rate and rhythm, no murmurs, no carotid bruits  NEUROLOGIC: MENTAL STATUS: awake, alert, language fluent, comprehension intact, naming intact, fund of knowledge appropriate CRANIAL NERVE: no papilledema on fundoscopic exam, pupils equal and reactive to light, visual fields full to confrontation, NO DOUBLE VISION;  SACCADIC DYSMETRIA; no nystagmus, DECR RIGHT FACE TEMP SENSATION; facial strength symmetric, hearing intact, palate elevates symmetrically, uvula midline,  shoulder shrug symmetric, tongue midline. MOTOR: normal bulk; full strength in BUE and BLE SENSORY: DECR IN RIGHT LEG COORDINATION: finger-nose-finger, fine finger movements SLIGHTLY SLOW ON RUE REFLEXES: deep tendon reflexes present and symmetric GAIT/STATION: narrow based gait; romberg is negative    DIAGNOSTIC DATA (LABS, IMAGING, TESTING) - I reviewed patient records, labs, notes, testing and imaging myself where available.  Lab Results  Component Value Date   WBC 8.9 09/18/2013   HGB 15.1 09/18/2013   HCT 45.0 09/18/2013   MCV 79 09/18/2013   PLT 393* 09/18/2013      Component Value Date/Time   NA 141 08/13/2013 1155   K 4.6 08/13/2013 1155   CL 104 08/13/2013 1155   CO2 21 08/13/2013 1155   GLUCOSE 101* 08/13/2013 1155   BUN 9 08/13/2013 1155   CREATININE 0.67 08/13/2013 1155   CALCIUM 9.6 08/13/2013 1155   PROT 7.1 09/18/2013 1427   PROT 7.8 08/13/2013 1155   ALBUMIN 4.3 08/13/2013 1155   AST 21 09/18/2013 1427   ALT 16 09/18/2013 1427   ALKPHOS 60 09/18/2013 1427   BILITOT 1.6* 09/18/2013 1427   GFRNONAA >90 08/13/2013 1155   GFRAA >90 08/13/2013 1155   No results found for: CHOL No results found for: HGBA1C No results found for: VITAMINB12 No results found for: TSH  I reviewed images myself and agree with interpretation. -VRP  08/13/13 MRI brain (without) - Findings consistent with widespread supratentorial fulminant acute multiple sclerosis. No dominant lesion to suggest tumefactive MS.    09/04/13 MRI brain (with and without) - Multiple acute and chronic, supratentorial and infratentorial chronic demyelinating plaques. No significant change from MRI on 08/13/13, although prior study did not include post-contrast views.  09/04/13 MRI cervical spine (with and without) - normal  09/04/13 MRI thoracic spine (with and without) - normal  03/16/14 MRI brain (with and without) - Highly abnormal MRI scan of the brain showing multiple supratentorial (periventricular,  subcortical, juxtacortical, thalamic, basal ganglia) and infratentorial (midbrain, pontine, cerebellar) T2 hyperintensities, consistent with both active and chronic demyelinating plaques. Multiple enhancing lesions are noted. Compared with previous MRI dated 09/04/2013 there appears to be increase in overall acute and chronic disease activity with a new large right posterior frontal and enlarged right cerebellar cystic ring-enhancing lesion noted.  08/27/13 Visual Evoked Potentials - normal  08/24/13 Labs: ANA. ANCA. ESR, CRP, NMO, HIV, Hep B, Hep C, ACE, RPR, Lyme Ab - all negative  Vit D, 25-Hydroxy  Date Value Ref Range Status  08/24/2013 8.6* 30.0 - 100.0 ng/mL Final   08/24/13 JCV antibody / index - 2.31 (H) POSITIVE   ASSESSMENT AND PLAN  28 y.o. year old male here with multiple sclerosis, with symptoms dating back to 2010. Confirmatory and rule out testing completed. Discussed diagnosis, prognosis and treatment options. Given clinical consideration, patient involvement, and JCV positive status, we decided on TECFIDERA.    PLAN: - continue TECFIDERA - repeat labs CBC and Vit D level in 1 month - may return to work tomorrow  Orders Placed This Encounter  Procedures  . CBC With differential/Platelet  . Vit D  25 hydroxy (rtn osteoporosis monitoring)   Return in about 2 months (around 06/28/2014).    Penni Bombard, MD 91/44/4584, 83:50 AM Certified in Neurology, Neurophysiology and Neuroimaging  [facility] [address] [place], [place] [address] [phone]

## 2014-04-28 NOTE — Telephone Encounter (Signed)
Faxed new letter to fax number provided below.

## 2014-05-07 HISTORY — PX: DENTAL SURGERY: SHX609

## 2014-05-21 DIAGNOSIS — Z0289 Encounter for other administrative examinations: Secondary | ICD-10-CM

## 2014-05-24 ENCOUNTER — Telehealth: Payer: Self-pay | Admitting: *Deleted

## 2014-05-24 NOTE — Telephone Encounter (Signed)
Form on Casandra desk. 

## 2014-05-31 ENCOUNTER — Telehealth: Payer: Self-pay | Admitting: *Deleted

## 2014-05-31 NOTE — Telephone Encounter (Signed)
Form,[employer], Disability received,completed by Dr Marjory Lies at front for patient 05-31-14.

## 2014-06-15 ENCOUNTER — Emergency Department (HOSPITAL_COMMUNITY)
Admission: EM | Admit: 2014-06-15 | Discharge: 2014-06-15 | Disposition: A | Discharge status: Home / Self Care | Payer: 59 | Attending: Emergency Medicine | Admitting: Emergency Medicine

## 2014-06-15 ENCOUNTER — Encounter (HOSPITAL_COMMUNITY): Payer: Self-pay

## 2014-06-15 DIAGNOSIS — Z86718 Personal history of other venous thrombosis and embolism: Secondary | ICD-10-CM | POA: Diagnosis not present

## 2014-06-15 DIAGNOSIS — G35 Multiple sclerosis: Secondary | ICD-10-CM | POA: Insufficient documentation

## 2014-06-15 DIAGNOSIS — R45851 Suicidal ideations: Secondary | ICD-10-CM | POA: Diagnosis present

## 2014-06-15 DIAGNOSIS — Z87891 Personal history of nicotine dependence: Secondary | ICD-10-CM | POA: Diagnosis not present

## 2014-06-15 DIAGNOSIS — Z79899 Other long term (current) drug therapy: Secondary | ICD-10-CM | POA: Insufficient documentation

## 2014-06-15 HISTORY — DX: Multiple sclerosis: G35

## 2014-06-15 LAB — COMPREHENSIVE METABOLIC PANEL WITH GFR
ALT: 18 U/L (ref 0–53)
AST: 19 U/L (ref 0–37)
Albumin: 4.3 g/dL (ref 3.5–5.2)
Alkaline Phosphatase: 46 U/L (ref 39–117)
Anion gap: 10 (ref 5–15)
BUN: 10 mg/dL (ref 6–23)
CO2: 24 mmol/L (ref 19–32)
Calcium: 9.5 mg/dL (ref 8.4–10.5)
Chloride: 102 mmol/L (ref 96–112)
Creatinine, Ser: 0.77 mg/dL (ref 0.50–1.35)
GFR calc Af Amer: >90 mL/min
GFR calc non Af Amer: >90 mL/min
Glucose, Bld: 92 mg/dL (ref 70–99)
Potassium: 4.1 mmol/L (ref 3.5–5.1)
Sodium: 136 mmol/L (ref 135–145)
Total Bilirubin: 2.4 mg/dL — ABNORMAL HIGH (ref 0.3–1.2)
Total Protein: 7.4 g/dL (ref 6.0–8.3)

## 2014-06-15 LAB — SALICYLATE LEVEL

## 2014-06-15 LAB — CBC
HCT: 44.4 % (ref 39.0–52.0)
Hemoglobin: 14.7 g/dL (ref 13.0–17.0)
MCH: 26.6 pg (ref 26.0–34.0)
MCHC: 33.1 g/dL (ref 30.0–36.0)
MCV: 80.4 fL (ref 78.0–100.0)
Platelets: 342 K/uL (ref 150–400)
RBC: 5.52 MIL/uL (ref 4.22–5.81)
RDW: 14.6 % (ref 11.5–15.5)
WBC: 10.2 K/uL (ref 4.0–10.5)

## 2014-06-15 LAB — ETHANOL: Alcohol, Ethyl (B): <5 mg/dL (ref 0–9)

## 2014-06-15 LAB — ACETAMINOPHEN LEVEL: Acetaminophen (Tylenol), Serum: <10 ug/mL — ABNORMAL LOW (ref 10–30)

## 2014-06-15 MED ORDER — ONDANSETRON HCL 4 MG PO TABS
4.0000 mg | ORAL_TABLET | Freq: Three times a day (TID) | ORAL | Status: DC | PRN
Start: 2014-06-15 — End: 2014-06-15

## 2014-06-15 MED ORDER — DIMETHYL FUMARATE 240 MG PO CPDR: 1.0000 | DELAYED_RELEASE_CAPSULE | Freq: Two times a day (BID) | ORAL | Status: DC

## 2014-06-15 MED ORDER — ALUM & MAG HYDROXIDE-SIMETH 200-200-20 MG/5ML PO SUSP: 30.0000 mL | ORAL | Status: DC | PRN

## 2014-06-15 MED ORDER — IBUPROFEN 400 MG PO TABS: 600.0000 mg | ORAL_TABLET | Freq: Three times a day (TID) | ORAL | Status: DC | PRN

## 2014-06-15 MED ORDER — LORAZEPAM 1 MG PO TABS: 1.0000 mg | ORAL_TABLET | Freq: Three times a day (TID) | ORAL | Status: DC | PRN

## 2014-06-15 MED ORDER — ACETAMINOPHEN 325 MG PO TABS: 650.0000 mg | ORAL_TABLET | ORAL | Status: DC | PRN

## 2014-06-15 NOTE — ED Notes (Addendum)
PT GIVEN WATER TO DRINK. REMINDED  HIM WE NEED A URINE SAMPLE. HE HAS SAMPLE CUP AT BEDSIDE. VERBALIZES UNDERSTANDING. PT GIVEN PAPER AND PEN TO "DOODLE" PER HIS REQUEST

## 2014-06-15 NOTE — Discharge Instructions (Signed)
Follow-up with the resources provided. return here as needed for any worsening in her condition

## 2014-06-15 NOTE — ED Notes (Signed)
Pt signed "No Harm Contract" - copy given to pt.

## 2014-06-15 NOTE — ED Notes (Signed)
Pt reports SI x 4 days. States "sometimes when I am driving I just want to run the car off the road." Denies auditory /visual halucenations. Denies ETOH/drug abuse. States he was dx with MS last year which has caused some depression. Pt calm,coopeative in triage. Denies hx of depression.

## 2014-06-15 NOTE — ED Notes (Addendum)
PT ADVISES HIS BROTHER AND A COUPLE OF CO-WORKERS ARE AWARE HE IS HERE AND WHY. STATES DOES NOT WANT OTHERS TO KNOW WHY. PT VOICES UNDERSTANDING OF POD C POLICIES.

## 2014-06-15 NOTE — BH Assessment (Signed)
TTS William Fitzgerald is assessing the patient.

## 2014-06-15 NOTE — ED Notes (Signed)
William Fitzgerald, [place], aware [facility] counselor attempting to reach him and advise pt may be d/c'd.

## 2014-06-15 NOTE — BH Assessment (Signed)
Tele Assessment Note   William Fitzgerald is an 29 y.o. male who was brought to the emergency department by his brother due to having passive suicidal thoughts and worsening depression in the last 2-3 days. He does not state he has a plan to hurt himself at this time. He states that he was diagnosed with MS a couple of years ago and had a flare up in October of 2015.  He says that this contributes to his depression and sometimes he just has thoughts of not wanting to be here anymore. He states that since he has told people about his negative thinking he feels better and wants to continue to get treatment through talk therapy. He denies substance abuse, HI and A/V hallucinations at this time. He has no psych history (never been inpatient or outpatient) and has never felt suicidal in the past per his report. He does not take any medications for depression and has never been to a psychiatrist or therapist in the past. Pt states that he has a lot of supportive family, friends and coworkers. Pt contracts for safety at this time and states he feels that he would be safe at home and not hurt himself. Pt will be given a no harm contract to sign and be discharged to follow up with outpatient services.   Disposition: refer to intensive outpatient services per Claudette Head, NP.   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 DVT (deep venous thrombosis) 2014  . MS (multiple sclerosis)    Axis IV: other psychosocial or environmental problems Axis V: 51-60 moderate symptoms  Past Medical History:  Past Medical History  Diagnosis Date  . DVT (deep venous thrombosis) 2014  . MS (multiple sclerosis)     Past Surgical History  Procedure Laterality Date  . Vascular surgery Right 06/2013    Family History:  Family History  Problem Relation Age of Onset  . Lung cancer Maternal Aunt   . Diabetes Maternal Grandfather    . Lung cancer Maternal Aunt     Social History:  reports that he has quit smoking. He has never used smokeless tobacco. He reports that he does not drink alcohol or use illicit drugs.  Additional Social History:  Alcohol / Drug Use History of alcohol / drug use?: No history of alcohol / drug abuse  CIWA: CIWA-Ar BP: 141/59 mmHg Pulse Rate: 67 COWS:    PATIENT STRENGTHS: (choose at least two) Ability for insight General fund of knowledge Motivation for treatment/growth Supportive family/friends  Allergies: No Known Allergies  Home Medications:  (Not in a hospital admission)  OB/GYN Status:  No LMP for male patient.  General Assessment Data Location of Assessment: [facility] ED Is this a Tele or Face-to-Face Assessment?: Tele Assessment Is this an Initial Assessment or a Re-assessment for this encounter?: Initial Assessment Living Arrangements: Other relatives Can pt return to current living arrangement?: Yes Admission Status: Voluntary Is patient capable of signing voluntary admission?: Yes Transfer from: Home Referral Source: Self/Family/Friend     [facility] Crisis Care Plan Living Arrangements: Other relatives Name of Psychiatrist:  (None) Name of Therapist: None  Education Status Is patient currently in school?: No Highest grade of school patient has completed:  (12th)  Risk to self with the past 6 months Suicidal Ideation: Yes-Currently Present Suicidal Intent: No Is patient at risk for suicide?: Yes Suicidal Plan?: No Access to Means: No What has  been your use of drugs/alcohol within the last 12 months?:  (Denies substance use) Previous Attempts/Gestures: No How many times?: 0 Other Self Harm Risks: none Triggers for Past Attempts: None known Intentional Self Injurious Behavior: None Family Suicide History: No Recent stressful life event(s): Other (Comment) (Diagnosed with MS had a flare up in October 2015) Persecutory voices/beliefs?: No Depression:  Yes Depression Symptoms: Despondent, Loss of interest in usual pleasures, Feeling worthless/self pity Substance abuse history and/or treatment for substance abuse?: No Suicide prevention information given to non-admitted patients: Yes  Risk to Others within the past 6 months Homicidal Ideation: No Thoughts of Harm to Others: No Current Homicidal Intent: No Current Homicidal Plan: No Access to Homicidal Means: No Identified Victim: N/A History of harm to others?: No Assessment of Violence: None Noted Violent Behavior Description: none Does patient have access to weapons?: No Criminal Charges Pending?: No Does patient have a court date: No  Psychosis Hallucinations: None noted Delusions: None noted  Mental Status Report Appear/Hygiene: Unremarkable, In scrubs Eye Contact: Good Motor Activity: Freedom of movement Speech: Unremarkable Level of Consciousness: Alert Mood: Depressed Affect: Appropriate to circumstance Anxiety Level: Minimal Thought Processes: Coherent Judgement: Partial Orientation: Person, Place, Time, Situation Obsessive Compulsive Thoughts/Behaviors: None  Cognitive Functioning Concentration: Unable to Assess Memory: Recent Intact, Remote Intact IQ: Above Average Insight: Good Impulse Control: Good Appetite: Good Weight Loss:  (0) Weight Gain: 0 Sleep: No Change Total Hours of Sleep:  (8) Vegetative Symptoms: None  ADLScreening [facility] Assessment Services) Patient's cognitive ability adequate to safely complete daily activities?: Yes Patient able to express need for assistance with ADLs?: Yes Independently performs ADLs?: Yes (appropriate for developmental age)  Prior Inpatient Therapy Prior Inpatient Therapy: No  Prior Outpatient Therapy Prior Outpatient Therapy: No  ADL Screening (condition at time of admission) Patient's cognitive ability adequate to safely complete daily activities?: Yes Is the patient deaf or have difficulty hearing?:  No Does the patient have difficulty seeing, even when wearing glasses/contacts?: No Does the patient have difficulty concentrating, remembering, or making decisions?: No Patient able to express need for assistance with ADLs?: Yes Does the patient have difficulty dressing or bathing?: No Independently performs ADLs?: Yes (appropriate for developmental age) Does the patient have difficulty walking or climbing stairs?: Yes (Has been diagnosed with MS) Weakness of Arms/Hands:  (UTA)  Home Assistive Devices/Equipment Home Assistive Devices/Equipment: None    Abuse/Neglect Assessment (Assessment to be complete while patient is alone) Physical Abuse: Denies Verbal Abuse: Denies Sexual Abuse: Denies Exploitation of patient/patient's resources: Denies Self-Neglect: Denies     Merchant navy officer (For Healthcare) Does patient have an advance directive?: No Would patient like information on creating an advanced directive?: No - patient declined information    Additional Information 1:1 In Past 12 Months?: No CIRT Risk: No Elopement Risk: No Does patient have medical clearance?: Yes     Disposition:  Disposition Initial Assessment Completed for this Encounter: Yes Disposition of Patient: Outpatient treatment Type of outpatient treatment: Psych Intensive Outpatient  Park Beck 06/15/2014 4:59 PM

## 2014-06-15 NOTE — ED Provider Notes (Signed)
CSN: [id number]     Arrival date & time 06/15/14  1404 History   First MD Initiated Contact with Patient 06/15/14 1601     Chief Complaint  Patient presents with  . Suicidal     (Consider location/radiation/quality/duration/timing/severity/associated sxs/prior Treatment) HPI Patient presents to the emergency department with thoughts of suicide.  The patient states that he feels like he has been depressed for quite a while, but does not take any medications for depression or anxiety.  The patient states that nothing seems make his condition, better or worse.  The patient denies hallucinations, nausea, vomiting, weakness, dizziness, headache, blurred vision, back pain, neck pain, cough, rhinitis, sore throat, fever or syncope.  Patient states that he does not see a primary care doctor, but does have a neurologist here locally Past Medical History  Diagnosis Date  . DVT (deep venous thrombosis) 2014  . MS (multiple sclerosis)    Past Surgical History  Procedure Laterality Date  . Vascular surgery Right 06/2013   Family History  Problem Relation Age of Onset  . Lung cancer Maternal Aunt   . Diabetes Maternal Grandfather   . Lung cancer Maternal Aunt    History  Substance Use Topics  . Smoking status: Former Games developer  . Smokeless tobacco: Never Used  . Alcohol Use: No    Review of Systems  All other systems negative except as documented in the HPI. All pertinent positives and negatives as reviewed in the HPI.  Allergies  Review of patient's allergies indicates no known allergies.  Home Medications   Prior to Admission medications   Medication Sig Start Date End Date Taking? Authorizing Provider  TECFIDERA 240 MG CPDR Take 2 tablets by mouth daily. 04/21/14   Historical Provider, MD   BP 141/59 mmHg  Pulse 67  Temp(Src) 98.3 F (36.8 C) (Oral)  Resp 18  SpO2 97% Physical Exam  Constitutional: He is oriented to person, place, and time. He appears well-developed and  well-nourished. No distress.  HENT:  Head: Normocephalic and atraumatic.  Mouth/Throat: Oropharynx is clear and moist.  Eyes: Pupils are equal, round, and reactive to light.  Neck: Normal range of motion. Neck supple.  Cardiovascular: Normal rate, regular rhythm and normal heart sounds.  Exam reveals no friction rub.   No murmur heard. Pulmonary/Chest: Effort normal and breath sounds normal. No respiratory distress.  Neurological: He is alert and oriented to person, place, and time. He exhibits normal muscle tone. Coordination normal.  Skin: Skin is warm and dry. No rash noted. No erythema.  Psychiatric: He exhibits a depressed mood. He expresses suicidal ideation. He expresses no homicidal ideation. He expresses no suicidal plans and no homicidal plans.  Nursing note and vitals reviewed.   ED Course  Procedures (including critical care time) Labs Review Labs Reviewed  ACETAMINOPHEN LEVEL  CBC  COMPREHENSIVE METABOLIC PANEL  ETHANOL  SALICYLATE LEVEL  URINE RAPID DRUG SCREEN (HOSP PERFORMED)     Patient will be evaluated by the TTS patient is aware of the plan and all questions were answered  Carlyle Dolly, PA-C 06/15/14 1619  Flint Melter, MD 06/15/14 1626

## 2014-06-15 NOTE — ED Notes (Signed)
Left message w/switchboard at [facility] advising Baxter Hire, Counselor, to call Ebbie Ridge, [place], at [phone].

## 2014-06-24 ENCOUNTER — Other Ambulatory Visit (HOSPITAL_COMMUNITY): Payer: 59

## 2014-06-25 ENCOUNTER — Other Ambulatory Visit (HOSPITAL_COMMUNITY): Payer: 59

## 2014-06-28 ENCOUNTER — Other Ambulatory Visit (HOSPITAL_COMMUNITY): Payer: 59

## 2014-06-28 ENCOUNTER — Other Ambulatory Visit (INDEPENDENT_AMBULATORY_CARE_PROVIDER_SITE_OTHER): Payer: Self-pay

## 2014-06-28 DIAGNOSIS — Z0289 Encounter for other administrative examinations: Secondary | ICD-10-CM

## 2014-06-28 DIAGNOSIS — G35 Multiple sclerosis: Secondary | ICD-10-CM

## 2014-06-29 ENCOUNTER — Telehealth: Payer: Self-pay | Admitting: *Deleted

## 2014-06-29 ENCOUNTER — Encounter: Payer: Self-pay | Admitting: *Deleted

## 2014-06-29 ENCOUNTER — Other Ambulatory Visit (HOSPITAL_COMMUNITY): Payer: 59

## 2014-06-29 LAB — CBC WITH DIFFERENTIAL
BASOS: 1 %
Basophils Absolute: 0.1 10*3/uL (ref 0.0–0.2)
EOS: 2 %
Eosinophils Absolute: 0.2 10*3/uL (ref 0.0–0.4)
HCT: 44.3 % (ref 37.5–51.0)
Hemoglobin: 14.2 g/dL (ref 12.6–17.7)
IMMATURE GRANS (ABS): 0 10*3/uL (ref 0.0–0.1)
IMMATURE GRANULOCYTES: 0 %
LYMPHS ABS: 1.8 10*3/uL (ref 0.7–3.1)
Lymphs: 22 %
MCH: 25.9 pg — ABNORMAL LOW (ref 26.6–33.0)
MCHC: 32.1 g/dL (ref 31.5–35.7)
MCV: 81 fL (ref 79–97)
Monocytes Absolute: 0.5 10*3/uL (ref 0.1–0.9)
Monocytes: 6 %
Neutrophils Absolute: 5.6 10*3/uL (ref 1.4–7.0)
Neutrophils Relative %: 69 %
RBC: 5.49 x10E6/uL (ref 4.14–5.80)
RDW: 14.2 % (ref 12.3–15.4)
WBC: 8.1 10*3/uL (ref 3.4–10.8)

## 2014-06-29 LAB — VITAMIN D 25 HYDROXY (VIT D DEFICIENCY, FRACTURES): Vit D, 25-Hydroxy: 19.1 ng/mL — ABNORMAL LOW (ref 30.0–100.0)

## 2014-06-29 NOTE — Telephone Encounter (Signed)
Unable to reach pt via phone to relay results note. Will mail him a letter with the results instead.

## 2014-06-30 ENCOUNTER — Other Ambulatory Visit (HOSPITAL_COMMUNITY): Payer: 59

## 2014-07-01 ENCOUNTER — Other Ambulatory Visit (HOSPITAL_COMMUNITY): Payer: 59

## 2014-07-02 ENCOUNTER — Other Ambulatory Visit (HOSPITAL_COMMUNITY): Payer: 59

## 2014-07-05 ENCOUNTER — Other Ambulatory Visit (HOSPITAL_COMMUNITY): Payer: 59

## 2014-07-06 ENCOUNTER — Ambulatory Visit (INDEPENDENT_AMBULATORY_CARE_PROVIDER_SITE_OTHER): Payer: 59 | Admitting: Psychology

## 2014-07-06 ENCOUNTER — Encounter (HOSPITAL_COMMUNITY): Payer: Self-pay | Admitting: Psychology

## 2014-07-06 ENCOUNTER — Other Ambulatory Visit (HOSPITAL_COMMUNITY): Payer: 59

## 2014-07-06 DIAGNOSIS — F339 Major depressive disorder, recurrent, unspecified: Secondary | ICD-10-CM | POA: Insufficient documentation

## 2014-07-06 DIAGNOSIS — F331 Major depressive disorder, recurrent, moderate: Secondary | ICD-10-CM

## 2014-07-06 DIAGNOSIS — F4010 Social phobia, unspecified: Secondary | ICD-10-CM

## 2014-07-06 NOTE — Progress Notes (Signed)
William Fitzgerald is a 29 y.o. male patient who is referred for counseling by IOP case manager as wasn't appropriate for IOP.  Patient:   William Fitzgerald   DOB:   10/26/1985  MR Number:  [id number]  Location:  [facility] [place] [id number] [place] [address] Dept: [phone]           Date of Service:   07/06/14  Start Time:   9.08am End Time:   10.04am  Provider/Observer:  Forde Radon [facility]       Billing Code/Service: [phone]  Chief Complaint:     Chief Complaint  Patient presents with  . Depression  . Establish Care    Reason for Service:  Pt presents to establish counseling for depression and anxiety.  Pt presented at [facility] ED for depression w/ SI on 06/15/14 and was assessed and referred to [facility] IOP.  Pt presented for assessment for IOP on 06/24/14 and discussed tx options and together agreed more appropriate for outpt individual counseling.  Pt reports having a long hx of depression that not tx for in past.  Pt also endorses anxiety that has been present from earliest memories- anxiety of social interactions, anxiety of being observed and around others and others judging him.  Pt reports since a child has avoided social interactions.  Pt reported that he was dx w/ M.S. Following testing in April 2015.  Pt reported initially in denial about, until experienced relapse of MS November 2015 then began tx w/ medications.  Pt reports that his MS is major stressor and impact on depression.  Pt reports also that he tends to keep things to himself and will ruminate on how relationships have come to an end and lacking closure.  Pt reports last dating relationship ended in 2014 and felt lacking closure of wanting to communicate apologies.    Current Status:  Pt reports that in past 2 weeks experiencing daily loss of interest, moderate depressed mood, moderate fatigue, increased appetite and poor concentration.   Pt reports feelings of low self worth "not good enough".  Pt also reports anxiety in social situations, avoids social situations for fear of being judged or viewed negatively by others.  Pt reports he has been sleeping good and that this has improved.  Pt also feels that depression in last couple of weeks has improved compared to when presented in the ED.   Reliability of Information: Pt provided information, records reviewed.   Behavioral Observation: William Fitzgerald  presents as a 29 y.o.-year-old  African American Male who appeared his stated age. his dress was Appropriate and he was Well Groomed and his manners were Appropriate to the situation.  There were not any physical disabilities noted.  he displayed an appropriate level of cooperation and motivation.    Interactions:    Active   Attention:   within normal limits  Memory:   within normal limits  Visuo-spatial:   not examined  Speech (Volume):  normal  Speech:   normal pitch and normal volume  Thought Process:  Coherent and Relevant  Though Content:  WNL  Orientation:   person, place, time/date and situation  Judgment:   Good  Planning:   Good  Affect:    Anxious and Depressed  Mood:    Anxious and Depressed  Insight:   Good  Intelligence:   normal  Marital Status/Living: Pt lives w/ his mother and his 13y/o niece. Pt was  born and grew up in [place], [place].  Pt parents separated when he was 17y/o and pt reports that parents were amicable in separation and continued contact with father.  Pt reports that he is closest to his maternal grandmother and able to express things to her the easiest. Pt has an older 28y/o brother who lives in [place] and an older 71 y/o sister who lives in [place].  Pt has never been married and no children.  Pt reports good relationship w/ brother and sister.  Strengths:   Pt reports he enjoys cooking, listening to a variety of music and traveling.  Pt reports he has been enjoying trips to  [place].  Pt reports support of grandmother and brother.  Pt reports feels supported at work.    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  Pt has worked there 4 years and enjoys his work and coworkers and felt good support from work w/his MS.  Pt does report stressors of working w/ customers that doesn't enjoy.    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   Substance Use:  No concerns of substance abuse are reported.  Pt denies any abuse of alcohol.  Pt reported he used to drink at times socially in the past, but no longer drinks.  Pt reports no use of drugs in past.  Pt reports quit smoking tobacco at least 6 years ago.   Education:   HS Graduate  Medical History:   Past Medical History  Diagnosis Date  . DVT (deep venous thrombosis) 2014  . MS (multiple sclerosis)         Outpatient Encounter Prescriptions as of 07/06/2014  Medication Sig  . TECFIDERA 240 MG CPDR Take 2 tablets by mouth daily.        Pt reports been taking medication for MS since November 2015.  He reports still has some problems with right hand coordination but otherwise doing well w/ MS.   Sexual History:   History  Sexual Activity  . Sexual Activity: Not Currently    Abuse/Trauma History: None reported  Psychiatric History:  Pt has no hx of counseling or seeing a psychiatrist in the past.   Family Med/Psych History:  Family History  Problem Relation Age of Onset  . Lung cancer Maternal Aunt   . Diabetes Maternal Grandfather   . Lung cancer Maternal Aunt   . Schizophrenia Cousin     Risk of Suicide/Violence: low Pt reports SI that has been present in the past 1-2 months.  Pt endorses passive thoughts of suicide w/out a plan.  Pt denies intent and discussed that he has things he still wants to accomplish to live for.  Pt reports last SI a week ago.  Pt did seek evaluation in ED  on 06/15/14 when her reported SI and beginning to contemplate how- but no intent. Pt was evaluated and referred to outpt tx.  Pt no hx of self harm.  Pt no hx of HI  Impression/DX:  Pt is a 28y/o male who presents seeking counseling for depression and anxiety.  Pt was referred for outpt counseling after presented in the ED on 06/15/14 for SI and severe depression.  Pt reports he continues to have moderated depressed moods, fatigue, poor concentration and severe loss of interest.  Pt also acknowledges struggles w/ low self worth.  Pt does endorsed depression  in past as well.  Pt also endorses symptoms consistent w/ Social anxiety d/o and acknowledges that he has been struggling w/ this since earliest memories.  Pt reports no SI for over a week.  Pt receptive to counseling, aware of supports and strengths.    Disposition/Plan:  Pt to f/u w/ individual counseling in 1 week. Counselor reviewed consent for tx, client rights and confidentiality. Pt to continue w/ medical doctor for MS.  Pt to come w/ goals to next session to work w/ counselor and develop tx plan.  Pt to seek evaluation at ED if SI w/ intent/plan or severe depression returns.   Diagnosis:      Major depressive disorder, recurrent episode, moderate  Social anxiety disorder               Zarriah Starkel, LPC

## 2014-07-07 ENCOUNTER — Other Ambulatory Visit (HOSPITAL_COMMUNITY): Payer: 59

## 2014-07-08 ENCOUNTER — Other Ambulatory Visit (HOSPITAL_COMMUNITY): Payer: 59

## 2014-07-09 ENCOUNTER — Other Ambulatory Visit (HOSPITAL_COMMUNITY): Payer: 59

## 2014-07-12 ENCOUNTER — Other Ambulatory Visit (HOSPITAL_COMMUNITY): Payer: 59

## 2014-07-13 ENCOUNTER — Other Ambulatory Visit (HOSPITAL_COMMUNITY): Payer: 59

## 2014-07-14 ENCOUNTER — Other Ambulatory Visit (HOSPITAL_COMMUNITY): Payer: 59

## 2014-07-15 ENCOUNTER — Other Ambulatory Visit (HOSPITAL_COMMUNITY): Payer: 59

## 2014-07-16 ENCOUNTER — Other Ambulatory Visit (HOSPITAL_COMMUNITY): Payer: 59

## 2014-07-19 ENCOUNTER — Other Ambulatory Visit (HOSPITAL_COMMUNITY): Payer: 59

## 2014-07-20 ENCOUNTER — Other Ambulatory Visit (HOSPITAL_COMMUNITY): Payer: 59

## 2014-07-21 ENCOUNTER — Other Ambulatory Visit (HOSPITAL_COMMUNITY): Payer: 59

## 2014-07-22 ENCOUNTER — Other Ambulatory Visit (HOSPITAL_COMMUNITY): Payer: 59

## 2014-07-23 ENCOUNTER — Ambulatory Visit (HOSPITAL_COMMUNITY): Payer: Self-pay | Admitting: Psychology

## 2014-07-23 ENCOUNTER — Other Ambulatory Visit (HOSPITAL_COMMUNITY): Payer: 59

## 2014-07-26 ENCOUNTER — Other Ambulatory Visit (HOSPITAL_COMMUNITY): Payer: 59

## 2014-07-27 ENCOUNTER — Other Ambulatory Visit (HOSPITAL_COMMUNITY): Payer: 59

## 2014-07-28 ENCOUNTER — Other Ambulatory Visit (HOSPITAL_COMMUNITY): Payer: 59

## 2014-07-29 ENCOUNTER — Other Ambulatory Visit (HOSPITAL_COMMUNITY): Payer: 59

## 2014-07-30 ENCOUNTER — Other Ambulatory Visit (HOSPITAL_COMMUNITY): Payer: 59

## 2014-08-02 ENCOUNTER — Other Ambulatory Visit (HOSPITAL_COMMUNITY): Payer: 59

## 2014-08-03 ENCOUNTER — Other Ambulatory Visit (HOSPITAL_COMMUNITY): Payer: 59

## 2014-08-04 ENCOUNTER — Other Ambulatory Visit (HOSPITAL_COMMUNITY): Payer: 59

## 2014-08-06 ENCOUNTER — Ambulatory Visit (INDEPENDENT_AMBULATORY_CARE_PROVIDER_SITE_OTHER): Payer: 59 | Admitting: Psychology

## 2014-08-06 DIAGNOSIS — F4010 Social phobia, unspecified: Secondary | ICD-10-CM

## 2014-08-06 DIAGNOSIS — F331 Major depressive disorder, recurrent, moderate: Secondary | ICD-10-CM | POA: Diagnosis not present

## 2014-08-06 NOTE — Progress Notes (Signed)
   THERAPIST PROGRESS NOTE  Session Time: 10am-10.48am  Participation Level: Active  Behavioral Response: Well GroomedAlertAnxious  Type of Therapy: Individual Therapy  Treatment Goals addressed: Diagnosis: MDD, Social Anxiety and Goal 1.  Interventions: CBT and Other: Mindfulness  Summary: William Fitzgerald is a 29 y.o. male who presents with affect congruent w/ report of depressed and anxious mood.  Pt reported that depression still moderate, but no further thoughts of SI.  Pt reported that he is still withdrawn and avoidant of social interactions.  Pt was able to increased awareness that even despite social anxiety he is likely introverted.  Pt reported that he chose not to go to [place] family celebration as anxiety and prefers to be alone.  Pt reported that he tends to feel more comfortable around coworkers and has become comfortable with them over time.  Pt reported feeling good moment of connecting w/ coworker recently.  Pt receptive to use of mindfulness and noticing fully positive feelings to assist w/ building positive thought patterns.  Pt discussed how he enjoys driving and exploring small towns and area (along [place] in [place] where he can practice mindfulness through use of senses.   Suicidal/Homicidal: Nowithout intent/plan  Therapist Response: Assessed pt current functioning per pt report.  Developed tx plan according to pt wants for tx.  Explored w/ pt interactions w/ family and peers.  Discussed pt personality of introverted and what healthy balance of interaction would look like.  Focused on positive interactions and discussed benefit of noticing positive feelings and moments to assist w/ facilitation positive thoughts.  Introduced and provided psychoeducation re: mindfulness and how to practice with use of senses.  Discussed how to incorporate this into is day to day.    Plan: Return again in 2 weeks. Pt scheduled w/ psychiatrist for assessment.   Diagnosis: MDD, Social Anxiety  D/O    [place], [facility] 08/06/2014

## 2014-08-26 ENCOUNTER — Ambulatory Visit (INDEPENDENT_AMBULATORY_CARE_PROVIDER_SITE_OTHER): Payer: 59 | Admitting: Psychology

## 2014-08-26 DIAGNOSIS — F330 Major depressive disorder, recurrent, mild: Secondary | ICD-10-CM

## 2014-08-26 DIAGNOSIS — F4010 Social phobia, unspecified: Secondary | ICD-10-CM | POA: Diagnosis not present

## 2014-08-26 NOTE — Progress Notes (Signed)
   THERAPIST PROGRESS NOTE  Session Time: 12.30pm-1.16pm  Participation Level: Active  Behavioral Response: Well GroomedAlertAnxious  Type of Therapy: Individual Therapy  Treatment Goals addressed: Diagnosis: Social Anxiety, MDD and goal 1.  Interventions: CBT and Meditation: breath counting meditation.  Summary: William Fitzgerald is a 29 y.o. male who presents with generally full and bright affect.  Pt reported that he is having a good day and mood has been bright.  Pt reported decreased depressed moods, no SI, feeling more hopeful.  Pt reported still struggles w/ social anxiety.  Pt reported that he has gone to river and practiced mindfulness w/ senses and was able to experience some through this.  Pt also reported interest in mediation, but feels that he struggles to "clear his mind".  Pt was able to gain better awareness of expectation of meditation and building a meditation practice.  Pt discussed awareness of cognitive distortions that he has held in past- trying to please everyone all the time and wanting to be "normal".  Pt discussed feeling more acceptance for self and awareness that he is not responsible for others happiness/joy.  Pt participated in breath counting meditation w/ use of mantra for intention.  Pt agrees to practice daily.  Pt reported that may is busy month and would like to f/u next in June.   Suicidal/Homicidal: Nowithout intent/plan  Therapist Response: Assessed pt current functioning per pt report.  Explored w/ pt mindfulness practice and discussed meditation practice.  Explored w/pt cognitive distortions and assisted pt in refaming. Provided psycho education on meditation.  Introduced to breath counting mediation and use of mantra for focus and intention.   Plan: Return again in 2 weeks.  Diagnosis:  Social Anxiety and MDD      Forde Radon, [facility] 08/26/2014

## 2014-09-09 ENCOUNTER — Ambulatory Visit (HOSPITAL_COMMUNITY): Payer: Self-pay | Admitting: Psychiatry

## 2014-09-21 ENCOUNTER — Emergency Department (HOSPITAL_COMMUNITY)
Admission: EM | Admit: 2014-09-21 | Discharge: 2014-09-21 | Discharge status: Against Medical Advice | Payer: 59 | Attending: Emergency Medicine | Admitting: Emergency Medicine

## 2014-09-21 ENCOUNTER — Encounter (HOSPITAL_COMMUNITY): Payer: Self-pay | Admitting: Emergency Medicine

## 2014-09-21 DIAGNOSIS — Y9289 Other specified places as the place of occurrence of the external cause: Secondary | ICD-10-CM | POA: Insufficient documentation

## 2014-09-21 DIAGNOSIS — W01198A Fall on same level from slipping, tripping and stumbling with subsequent striking against other object, initial encounter: Secondary | ICD-10-CM | POA: Diagnosis not present

## 2014-09-21 DIAGNOSIS — R55 Syncope and collapse: Secondary | ICD-10-CM | POA: Diagnosis present

## 2014-09-21 DIAGNOSIS — Y998 Other external cause status: Secondary | ICD-10-CM | POA: Diagnosis not present

## 2014-09-21 DIAGNOSIS — Y9389 Activity, other specified: Secondary | ICD-10-CM | POA: Diagnosis not present

## 2014-09-21 DIAGNOSIS — S0990XA Unspecified injury of head, initial encounter: Secondary | ICD-10-CM | POA: Insufficient documentation

## 2014-09-21 LAB — CBC WITH DIFFERENTIAL/PLATELET
Basophils Absolute: 0 10*3/uL (ref 0.0–0.1)
Basophils Relative: 0 % (ref 0–1)
EOS PCT: 1 % (ref 0–5)
Eosinophils Absolute: 0.1 10*3/uL (ref 0.0–0.7)
HCT: 43.4 % (ref 39.0–52.0)
Hemoglobin: 14.7 g/dL (ref 13.0–17.0)
LYMPHS ABS: 1.5 10*3/uL (ref 0.7–4.0)
Lymphocytes Relative: 12 % (ref 12–46)
MCH: 26.9 pg (ref 26.0–34.0)
MCHC: 33.9 g/dL (ref 30.0–36.0)
MCV: 79.3 fL (ref 78.0–100.0)
MONO ABS: 1 10*3/uL (ref 0.1–1.0)
Monocytes Relative: 8 % (ref 3–12)
Neutro Abs: 10.4 10*3/uL — ABNORMAL HIGH (ref 1.7–7.7)
Neutrophils Relative %: 79 % — ABNORMAL HIGH (ref 43–77)
Platelets: 272 10*3/uL (ref 150–400)
RBC: 5.47 MIL/uL (ref 4.22–5.81)
RDW: 13.7 % (ref 11.5–15.5)
WBC: 13 10*3/uL — ABNORMAL HIGH (ref 4.0–10.5)

## 2014-09-21 LAB — I-STAT CHEM 8, ED
BUN: 8 mg/dL (ref 6–20)
CALCIUM ION: 1.17 mmol/L (ref 1.12–1.23)
CHLORIDE: 100 mmol/L — AB (ref 101–111)
Creatinine, Ser: 0.7 mg/dL (ref 0.61–1.24)
Glucose, Bld: 103 mg/dL — ABNORMAL HIGH (ref 65–99)
HCT: 51 % (ref 39.0–52.0)
Hemoglobin: 17.3 g/dL — ABNORMAL HIGH (ref 13.0–17.0)
Potassium: 3.9 mmol/L (ref 3.5–5.1)
Sodium: 136 mmol/L (ref 135–145)
TCO2: 21 mmol/L (ref 0–100)

## 2014-09-21 NOTE — ED Notes (Signed)
Called at triage for room x2 with no answer 

## 2014-09-21 NOTE — ED Notes (Signed)
Pt st's he was at work and started feeling dizzy.  St's he had a syncopal episode and fell backwards hitting his head on a counter.  Pt c/o headache, denies feeling dizzy at this time.  Pt alert and oriented x's 3

## 2014-09-22 ENCOUNTER — Emergency Department (HOSPITAL_COMMUNITY)
Admission: EM | Admit: 2014-09-22 | Discharge: 2014-09-22 | Disposition: A | Discharge status: Home / Self Care | Payer: 59 | Attending: Emergency Medicine | Admitting: Emergency Medicine

## 2014-09-22 ENCOUNTER — Encounter (HOSPITAL_COMMUNITY): Payer: Self-pay

## 2014-09-22 DIAGNOSIS — R55 Syncope and collapse: Secondary | ICD-10-CM | POA: Diagnosis not present

## 2014-09-22 DIAGNOSIS — W01198A Fall on same level from slipping, tripping and stumbling with subsequent striking against other object, initial encounter: Secondary | ICD-10-CM | POA: Diagnosis not present

## 2014-09-22 DIAGNOSIS — Z86718 Personal history of other venous thrombosis and embolism: Secondary | ICD-10-CM | POA: Diagnosis not present

## 2014-09-22 DIAGNOSIS — Z87891 Personal history of nicotine dependence: Secondary | ICD-10-CM | POA: Insufficient documentation

## 2014-09-22 DIAGNOSIS — Z79899 Other long term (current) drug therapy: Secondary | ICD-10-CM | POA: Diagnosis not present

## 2014-09-22 DIAGNOSIS — Y990 Civilian activity done for income or pay: Secondary | ICD-10-CM | POA: Insufficient documentation

## 2014-09-22 DIAGNOSIS — S0990XA Unspecified injury of head, initial encounter: Secondary | ICD-10-CM | POA: Diagnosis present

## 2014-09-22 DIAGNOSIS — Y9389 Activity, other specified: Secondary | ICD-10-CM | POA: Diagnosis not present

## 2014-09-22 DIAGNOSIS — Y92190 Kitchen in other specified residential institution as the place of occurrence of the external cause: Secondary | ICD-10-CM | POA: Insufficient documentation

## 2014-09-22 DIAGNOSIS — G35 Multiple sclerosis: Secondary | ICD-10-CM | POA: Diagnosis not present

## 2014-09-22 LAB — I-STAT CHEM 8, ED
BUN: 7 mg/dL (ref 6–20)
CALCIUM ION: 1.18 mmol/L (ref 1.12–1.23)
Chloride: 102 mmol/L (ref 101–111)
Creatinine, Ser: 0.7 mg/dL (ref 0.61–1.24)
GLUCOSE: 91 mg/dL (ref 65–99)
HCT: 49 % (ref 39.0–52.0)
Hemoglobin: 16.7 g/dL (ref 13.0–17.0)
Potassium: 4.2 mmol/L (ref 3.5–5.1)
Sodium: 140 mmol/L (ref 135–145)
TCO2: 24 mmol/L (ref 0–100)

## 2014-09-22 NOTE — Discharge Instructions (Signed)
Concussion  A concussion, or closed-head injury, is a brain injury caused by a direct blow to the head or by a quick and sudden movement (jolt) of the head or neck. Concussions are usually not life-threatening. Even so, the effects of a concussion can be serious. If you have had a concussion before, you are more likely to experience concussion-like symptoms after a direct blow to the head.   CAUSES  · Direct blow to the head, such as from running into another player during a soccer game, being hit in a fight, or hitting your head on a hard surface.  · A jolt of the head or neck that causes the brain to move back and forth inside the skull, such as in a car crash.  SIGNS AND SYMPTOMS  The signs of a concussion can be hard to notice. Early on, they may be missed by you, family members, and health care providers. You may look fine but act or feel differently.  Symptoms are usually temporary, but they may last for days, weeks, or even longer. Some symptoms may appear right away while others may not show up for hours or days. Every head injury is different. Symptoms include:  · Mild to moderate headaches that will not go away.  · A feeling of pressure inside your head.  · Having more trouble than usual:  ¨ Learning or remembering things you have heard.  ¨ Answering questions.  ¨ Paying attention or concentrating.  ¨ Organizing daily tasks.  ¨ Making decisions and solving problems.  · Slowness in thinking, acting or reacting, speaking, or reading.  · Getting lost or being easily confused.  · Feeling tired all the time or lacking energy (fatigued).  · Feeling drowsy.  · Sleep disturbances.  ¨ Sleeping more than usual.  ¨ Sleeping less than usual.  ¨ Trouble falling asleep.  ¨ Trouble sleeping (insomnia).  · Loss of balance or feeling lightheaded or dizzy.  · Nausea or vomiting.  · Numbness or tingling.  · Increased sensitivity to:  ¨ Sounds.  ¨ Lights.  ¨ Distractions.  · Vision problems or eyes that tire  easily.  · Diminished sense of taste or smell.  · Ringing in the ears.  · Mood changes such as feeling sad or anxious.  · Becoming easily irritated or angry for little or no reason.  · Lack of motivation.  · Seeing or hearing things other people do not see or hear (hallucinations).  DIAGNOSIS  Your health care provider can usually diagnose a concussion based on a description of your injury and symptoms. He or she will ask whether you passed out (lost consciousness) and whether you are having trouble remembering events that happened right before and during your injury.  Your evaluation might include:  · A brain scan to look for signs of injury to the brain. Even if the test shows no injury, you may still have a concussion.  · Blood tests to be sure other problems are not present.  TREATMENT  · Concussions are usually treated in an emergency department, in urgent care, or at a clinic. You may need to stay in the hospital overnight for further treatment.  · Tell your health care provider if you are taking any medicines, including prescription medicines, over-the-counter medicines, and natural remedies. Some medicines, such as blood thinners (anticoagulants) and aspirin, may increase the chance of complications. Also tell your health care provider whether you have had alcohol or are taking illegal drugs. This information   may affect treatment.  · Your health care provider will send you home with important instructions to follow.  · How fast you will recover from a concussion depends on many factors. These factors include how severe your concussion is, what part of your brain was injured, your age, and how healthy you were before the concussion.  · Most people with mild injuries recover fully. Recovery can take time. In general, recovery is slower in older persons. Also, persons who have had a concussion in the past or have other medical problems may find that it takes longer to recover from their current injury.  HOME  CARE INSTRUCTIONS  General Instructions  · Carefully follow the directions your health care provider gave you.  · Only take over-the-counter or prescription medicines for pain, discomfort, or fever as directed by your health care provider.  · Take only those medicines that your health care provider has approved.  · Do not drink alcohol until your health care provider says you are well enough to do so. Alcohol and certain other drugs may slow your recovery and can put you at risk of further injury.  · If it is harder than usual to remember things, write them down.  · If you are easily distracted, try to do one thing at a time. For example, do not try to watch TV while fixing dinner.  · Talk with family members or close friends when making important decisions.  · Keep all follow-up appointments. Repeated evaluation of your symptoms is recommended for your recovery.  · Watch your symptoms and tell others to do the same. Complications sometimes occur after a concussion. Older adults with a brain injury may have a higher risk of serious complications, such as a blood clot on the brain.  · Tell your teachers, school nurse, school counselor, coach, athletic trainer, or work manager about your injury, symptoms, and restrictions. Tell them about what you can or cannot do. They should watch for:  ¨ Increased problems with attention or concentration.  ¨ Increased difficulty remembering or learning new information.  ¨ Increased time needed to complete tasks or assignments.  ¨ Increased irritability or decreased ability to cope with stress.  ¨ Increased symptoms.  · Rest. Rest helps the brain to heal. Make sure you:  ¨ Get plenty of sleep at night. Avoid staying up late at night.  ¨ Keep the same bedtime hours on weekends and weekdays.  ¨ Rest during the day. Take daytime naps or rest breaks when you feel tired.  · Limit activities that require a lot of thought or concentration. These include:  ¨ Doing homework or job-related  work.  ¨ Watching TV.  ¨ Working on the computer.  · Avoid any situation where there is potential for another head injury (football, hockey, soccer, basketball, martial arts, downhill snow sports and horseback riding). Your condition will get worse every time you experience a concussion. You should avoid these activities until you are evaluated by the appropriate follow-up health care providers.  Returning To Your Regular Activities  You will need to return to your normal activities slowly, not all at once. You must give your body and brain enough time for recovery.  · Do not return to sports or other athletic activities until your health care provider tells you it is safe to do so.  · Ask your health care provider when you can drive, ride a bicycle, or operate heavy machinery. Your ability to react may be slower after a   brain injury. Never do these activities if you are dizzy.  · Ask your health care provider about when you can return to work or school.  Preventing Another Concussion  It is very important to avoid another brain injury, especially before you have recovered. In rare cases, another injury can lead to permanent brain damage, brain swelling, or death. The risk of this is greatest during the first 7-10 days after a head injury. Avoid injuries by:  · Wearing a seat belt when riding in a car.  · Drinking alcohol only in moderation.  · Wearing a helmet when biking, skiing, skateboarding, skating, or doing similar activities.  · Avoiding activities that could lead to a second concussion, such as contact or recreational sports, until your health care provider says it is okay.  · Taking safety measures in your home.  ¨ Remove clutter and tripping hazards from floors and stairways.  ¨ Use grab bars in bathrooms and handrails by stairs.  ¨ Place non-slip mats on floors and in bathtubs.  ¨ Improve lighting in dim areas.  SEEK MEDICAL CARE IF:  · You have increased problems paying attention or  concentrating.  · You have increased difficulty remembering or learning new information.  · You need more time to complete tasks or assignments than before.  · You have increased irritability or decreased ability to cope with stress.  · You have more symptoms than before.  Seek medical care if you have any of the following symptoms for more than 2 weeks after your injury:  · Lasting (chronic) headaches.  · Dizziness or balance problems.  · Nausea.  · Vision problems.  · Increased sensitivity to noise or light.  · Depression or mood swings.  · Anxiety or irritability.  · Memory problems.  · Difficulty concentrating or paying attention.  · Sleep problems.  · Feeling tired all the time.  SEEK IMMEDIATE MEDICAL CARE IF:  · You have severe or worsening headaches. These may be a sign of a blood clot in the brain.  · You have weakness (even if only in one hand, leg, or part of the face).  · You have numbness.  · You have decreased coordination.  · You vomit repeatedly.  · You have increased sleepiness.  · One pupil is larger than the other.  · You have convulsions.  · You have slurred speech.  · You have increased confusion. This may be a sign of a blood clot in the brain.  · You have increased restlessness, agitation, or irritability.  · You are unable to recognize people or places.  · You have neck pain.  · It is difficult to wake you up.  · You have unusual behavior changes.  · You lose consciousness.  MAKE SURE YOU:  · Understand these instructions.  · Will watch your condition.  · Will get help right away if you are not doing well or get worse.  Document Released: 07/14/2003 Document Revised: 04/28/2013 Document Reviewed: 11/13/2012  [employer]® Patient Information ©2015 [employer]. This information is not intended to replace advice given to you by your health care provider. Make sure you discuss any questions you have with your health care provider.

## 2014-09-22 NOTE — ED Provider Notes (Signed)
CSN: [id number]     Arrival date & time 09/22/14  1242 History   First MD Initiated Contact with Patient 09/22/14 1455     Chief Complaint  Patient presents with  . Head Injury     (Consider location/radiation/quality/duration/timing/severity/associated sxs/prior Treatment) Patient is a 29 y.o. male presenting with head injury. The history is provided by the patient.  Head Injury Location:  L parietal Time since incident:  12 hours Mechanism of injury: fall   Pain details:    Quality:  Aching   Severity:  Mild   Duration:  12 hours   Timing:  Intermittent   Progression:  Resolved Chronicity:  New Relieved by:  Nothing Worsened by:  Nothing tried Ineffective treatments:  None tried Associated symptoms: headache   Associated symptoms: no nausea, no neck pain, no numbness and no vomiting   Headaches:    Severity:  Mild   Onset quality:  Gradual   Duration:  12 hours   Timing:  Intermittent   Progression:  Resolved   Chronicity:  Recurrent   Past Medical History  Diagnosis Date  . DVT (deep venous thrombosis) 2014  . MS (multiple sclerosis)   . MS (multiple sclerosis)    Past Surgical History  Procedure Laterality Date  . Vascular surgery Right 06/2013   Family History  Problem Relation Age of Onset  . Lung cancer Maternal Aunt   . Diabetes Maternal Grandfather   . Lung cancer Maternal Aunt   . Schizophrenia Cousin    History  Substance Use Topics  . Smoking status: Former Games developer  . Smokeless tobacco: Never Used  . Alcohol Use: No    Review of Systems  Constitutional: Negative for fever.  HENT: Negative for drooling and rhinorrhea.   Eyes: Negative for pain.  Respiratory: Negative for cough and shortness of breath.   Cardiovascular: Negative for chest pain and leg swelling.  Gastrointestinal: Negative for nausea, vomiting, abdominal pain and diarrhea.  Genitourinary: Negative for dysuria and hematuria.  Musculoskeletal: Negative for gait problem and neck  pain.  Skin: Negative for color change.  Neurological: Positive for syncope and headaches. Negative for numbness.  Hematological: Negative for adenopathy.  Psychiatric/Behavioral: Negative for behavioral problems.  All other systems reviewed and are negative.     Allergies  Review of patient's allergies indicates no known allergies.  Home Medications   Prior to Admission medications   Medication Sig Start Date End Date Taking? Authorizing Provider  TECFIDERA 240 MG CPDR Take 480 mg by mouth daily.  04/21/14  Yes Historical Provider, MD   BP 127/63 mmHg  Pulse 65  Temp(Src) 97.8 F (36.6 C) (Oral)  Resp 16  SpO2 100% Physical Exam  Constitutional: He is oriented to person, place, and time. He appears well-developed and well-nourished.  HENT:  Head: Normocephalic and atraumatic.  Right Ear: External ear normal.  Left Ear: External ear normal.  Nose: Nose normal.  Mouth/Throat: Oropharynx is clear and moist. No oropharyngeal exudate.  Normal-appearing tympanic membranes bilaterally.  Eyes: Conjunctivae and EOM are normal. Pupils are equal, round, and reactive to light.  Neck: Normal range of motion. Neck supple.  Cardiovascular: Normal rate, regular rhythm, normal heart sounds and intact distal pulses.  Exam reveals no gallop and no friction rub.   No murmur heard. Pulmonary/Chest: Effort normal and breath sounds normal. No respiratory distress. He has no wheezes.  Abdominal: Soft. Bowel sounds are normal. He exhibits no distension. There is no tenderness. There is no rebound and no  guarding.  Musculoskeletal: Normal range of motion. He exhibits no edema or tenderness.  Neurological: He is alert and oriented to person, place, and time.  alert, oriented x3 speech: normal in context and clarity memory: intact grossly cranial nerves II-XII: intact motor strength: full proximally and distally no involuntary movements or tremors sensation: intact to light touch diffusely   cerebellar: finger-to-nose and heel-to-shin intact gait: normal forwards and backwards  Skin: Skin is warm and dry.  Psychiatric: He has a normal mood and affect. His behavior is normal.  Nursing note and vitals reviewed.   ED Course  Procedures (including critical care time) Labs Review Labs Reviewed  I-STAT CHEM 8, ED    Imaging Review No results found.   EKG Interpretation   Date/Time:  Wednesday Sep 22 2014 12:53:38 EDT Ventricular Rate:  94 PR Interval:  142 QRS Duration: 78 QT Interval:  334 QTC Calculation: 418 R Axis:   78 Text Interpretation:  Normal sinus rhythm No significant change since last  tracing Confirmed by Vlada Uriostegui  MD, Roderic Lammert ([id number]) on 09/22/2014 2:57:23 PM      MDM   Final diagnoses:  Syncope, unspecified syncope type  Head injury, initial encounter    3:25 PM 29 y.o. male w hx of DVT, MS who presents with a syncopal episode and subsequent head injury which occurred yesterday evening while at work. He states he has been feeling dizzy while working for about 10 minutes and then syncopized hitting the occipital mass out of his head. He states that EMS was called but he did not go to the hospital. He had an intermittent left-sided headache overnight but now denies this. He has had similar headaches with MS flares. He denies any fevers or vomiting. Vital signs unremarkable here. He is asymptomatic on exam. No obvious traumatic injury to his scalp. Normal neurologic exam. Do not think CT had needed for pain and had CT criteria. I do suspect he likely has a mild concussion given the intermittent headache. This was discussed with him and he is happy with the plan. We'll get i-STAT Chem-8 as a part of syncopal workup. EKG noncontributory.  4:16 PM: I interpreted/reviewed the labs and/or imaging which were non-contributory.  Pt continues to appear well, asx. Gave concussion precautions. I have discussed the diagnosis/risks/treatment options with the patient and  believe the pt to be eligible for discharge home to follow-up with his pcp as needed. We also discussed returning to the ED immediately if new or worsening sx occur. We discussed the sx which are most concerning (e.g., worsening headache, fever, vomiting, ataxia.) that necessitate immediate return. Medications administered to the patient during their visit and any new prescriptions provided to the patient are listed below.  Medications given during this visit Medications - No data to display  New Prescriptions   No medications on file     Purvis Sheffield, MD 09/22/14 1617

## 2014-09-22 NOTE — ED Notes (Signed)
MD at bedside. 

## 2014-09-22 NOTE — ED Notes (Signed)
Pt presents with c/o head injury after passing out at work yesterday. Pt reports he hit his head on some equipment in the kitchen. Pt reports he became dizzy and he blacked out. Pt denies any dizziness at this time. Denies headache or blurred vision at this time.

## 2014-09-23 ENCOUNTER — Telehealth: Payer: Self-pay | Admitting: *Deleted

## 2014-09-23 NOTE — ED Notes (Signed)
Return to work note provided per patient request. Faxed to [phone]

## 2014-10-21 ENCOUNTER — Ambulatory Visit (HOSPITAL_COMMUNITY): Payer: Self-pay | Admitting: Psychology

## 2014-10-28 ENCOUNTER — Encounter (HOSPITAL_COMMUNITY): Payer: Self-pay | Admitting: Psychiatry

## 2014-10-28 ENCOUNTER — Ambulatory Visit (INDEPENDENT_AMBULATORY_CARE_PROVIDER_SITE_OTHER): Payer: 59 | Admitting: Psychiatry

## 2014-10-28 VITALS — BP 124/80 | HR 78 | Ht 78.0 in | Wt 291.4 lb

## 2014-10-28 DIAGNOSIS — F331 Major depressive disorder, recurrent, moderate: Secondary | ICD-10-CM | POA: Diagnosis not present

## 2014-10-28 DIAGNOSIS — F39 Unspecified mood [affective] disorder: Secondary | ICD-10-CM | POA: Diagnosis not present

## 2014-10-28 MED ORDER — DULOXETINE HCL 30 MG PO CPEP: 30.0000 mg | ORAL_CAPSULE | Freq: Every day | ORAL | Status: DC

## 2014-10-28 NOTE — Progress Notes (Signed)
[facility] Initial Assessment Note  William Fitzgerald [id number] 29 y.o.  10/28/2014 9:50 AM  Chief Complaint:  I have depression and anxiety.  I had suicidal parts earlier this year and I went to the emergency room.  I'm seeing therapist in this office.  History of Present Illness:  Patient is 29 year old African-American single, employed man who is referred from William Fitzgerald for the management of depression and anxiety symptoms.  Patient was seen in the emergency room on February 9 because of severe depression and having suicidal thoughts.  Patient told his depression has been getting worse for past few months and he felt since November the intensity and frequency is more severe.  Patient has diagnosed with multiple sclerosis last year but he was in denial until October 2015 he had a flareup which causes diplopia, balance issues, poor attention and concentration and he decided to get treatment.  In November he was given medication.  Symptoms are somewhat better.  Patient told he has struggled most of his life for depression and anxiety symptoms but in past few months and due to multiple sclerosis he felt more sad depressed and isolated.  He endorse sometime feeling hopeless, helpless and does not want to be with someone.  He admitted socially withdrawn and having a lot of negative thinking about himself.  He does not want to make new friends because he has trust issue.  He admitted hearing voices and having paranoia on and off.  Patient told in February he was very depressed and he was doing research to find ways to kill himself.  He was thinking to cut his wrist however after talking to his brother and his supervisor he decided to get help and came to the emergency room.  Patient is doing somewhat better.  He is seeing William Fitzgerald for counseling.  However he still has symptoms of depression with decreased energy level, lack of appetite, weight loss, anhedonia, hopelessness and socially  withdrawn.  He has not hearing voices in recent months but admitted some time paranoia and does not like crowded places.  He has a very limited social network and despite multiple family member he is only close to his brother and his mother.  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  He likes his job because no one bothers him.  Recently he was seen in the emergency room because of syncopal episode and had injury and he was given pain medication.  Patient is seeing [facility] for his MS.  He wants to get better.  He is taking his medication.  Patient denies drinking or using any illegal substances.  Patient denies any history of self abusive behavior, aggression, severe mood swings, PTSD or any OCD symptoms.  He denies any panic attack in the past.  Patient denies drinking or using any illegal substances.  He lives with his mother.  He recently started a relationship with the girl and is hoping improvement in the relationship.  Currently patient is not taking any medication but he is open to try anything to help his anxiety and depression.  Suicidal Ideation: No Plan Formed: No Patient has means to carry out plan: No  Homicidal Ideation: No Plan Formed: No Patient has means to carry out plan: No  Past Psychiatric History/Hospitalization(s): Patient denies any history of suicidal attempt, inpatient psychiatric treatment or taking any medication in the past.  He endorse history of anxiety and depression for many years.  He was seen  in the emergency room in February 2016 because of suicidal thoughts.  He was discharged with a recommendation to see outpatient psychiatry.  Patient denies any history of aggression or mania.  He admitted history of hallucination and paranoia in the past. Anxiety: Yes Bipolar Disorder: No Depression: Yes Mania: No Psychosis: History of paranoia and hallucinations Schizophrenia: No Personality Disorder: No Hospitalization for psychiatric illness: No History  of Electroconvulsive Shock Therapy: No Prior Suicide Attempts: No  Medical History; Patient has multiple sclerosis.  He is seeing Dr William Fitzgerald at [facility] neurolog[facility].  Traumatic brain injury: Patient denies any history of traumatic brain injury.  Family History; Patient endorse Sr. has bipolar disorder and cousin has schizophrenia.  Education and Work History; Patient is high school graduate.  He is enrolled in [employer] to start college in August.  He is working at [employer] for past 4 years.  Psychosocial History; Patient born and raised in [place].  His parents were divorced at early age.  He was raised by his mother and grandparents.  He has one brother and sister who are older than him.  Patient never married.  He has no children.  He lives with his mother.  Legal History; Patient denies any history of legal issues.  History Of Abuse; Patient denies any history of abuse.  Substance Abuse History; Patient admitted history of smoking marijuana but claims to be sober since the last year.  Patient denies heavy drinking or any intoxication or blackouts.  Review of Systems: Psychiatric: Agitation: No Hallucination: No Depressed Mood: Yes Insomnia: No Hypersomnia: No Altered Concentration: No Feels Worthless: Yes Grandiose Ideas: No Belief In Special Powers: No New/Increased Substance Abuse: No Compulsions: No  Neurologic: Headache: No Seizure: No Paresthesias: Tingling in his right arm   Outpatient Encounter Prescriptions as of 10/28/2014  Medication Sig  . TECFIDERA 240 MG CPDR Take 480 mg by mouth daily.   . DULoxetine (CYMBALTA) 30 MG capsule Take 1 capsule (30 mg total) by mouth daily.   No facility-administered encounter medications on file as of 10/28/2014.    Recent Results (from the past 2160 hour(s))  CBC with Differential     Status: Abnormal   Collection Time: 09/21/14  8:54 PM  Result Value Ref Range   WBC 13.0 (H) 4.0 - 10.5 K/uL   RBC 5.47 4.22  - 5.81 MIL/uL   Hemoglobin 14.7 13.0 - 17.0 g/dL   HCT 45.4 09.8 - 11.9 %   MCV 79.3 78.0 - 100.0 fL   MCH 26.9 26.0 - 34.0 pg   MCHC 33.9 30.0 - 36.0 g/dL   RDW 14.7 82.9 - 56.2 %   Platelets 272 150 - 400 K/uL   Neutrophils Relative % 79 (H) 43 - 77 %   Neutro Abs 10.4 (H) 1.7 - 7.7 K/uL   Lymphocytes Relative 12 12 - 46 %   Lymphs Abs 1.5 0.7 - 4.0 K/uL   Monocytes Relative 8 3 - 12 %   Monocytes Absolute 1.0 0.1 - 1.0 K/uL   Eosinophils Relative 1 0 - 5 %   Eosinophils Absolute 0.1 0.0 - 0.7 K/uL   Basophils Relative 0 0 - 1 %   Basophils Absolute 0.0 0.0 - 0.1 K/uL  I-Stat Chem 8, ED     Status: Abnormal   Collection Time: 09/21/14  9:04 PM  Result Value Ref Range   Sodium 136 135 - 145 mmol/L   Potassium 3.9 3.5 - 5.1 mmol/L   Chloride 100 (L) 101 -  111 mmol/L   BUN 8 6 - 20 mg/dL   Creatinine, Ser 1.61 0.61 - 1.24 mg/dL   Glucose, Bld 096 (H) 65 - 99 mg/dL   Calcium, Ion 0.45 4.09 - 1.23 mmol/L   TCO2 21 0 - 100 mmol/L   Hemoglobin 17.3 (H) 13.0 - 17.0 g/dL   HCT 81.1 91.4 - 78.2 %  I-stat chem 8, ed     Status: None   Collection Time: 09/22/14  3:48 PM  Result Value Ref Range   Sodium 140 135 - 145 mmol/L   Potassium 4.2 3.5 - 5.1 mmol/L   Chloride 102 101 - 111 mmol/L   BUN 7 6 - 20 mg/dL   Creatinine, Ser 9.56 0.61 - 1.24 mg/dL   Glucose, Bld 91 65 - 99 mg/dL   Calcium, Ion 2.13 0.86 - 1.23 mmol/L   TCO2 24 0 - 100 mmol/L   Hemoglobin 16.7 13.0 - 17.0 g/dL   HCT 57.8 46.9 - 62.9 %      Constitutional:  BP 124/80 mmHg  Pulse 78  Ht  (1.981 m)  Wt 291 lb 6.4 oz (132.178 kg)  BMI 33.68 kg/m2   Musculoskeletal: Strength & Muscle Tone: spastic Gait & Station: normal Patient leans: N/A  Psychiatric Specialty Exam: General Appearance: Casual and Shy  Eye Contact::  Fair  Speech:  Slow  Volume:  Decreased  Mood:  Anxious, Depressed and Dysphoric  Affect:  Restricted  Thought Process:  Intact  Orientation:  Full (Time, Place, and Person)   Thought Content:  Rumination  Suicidal Thoughts:  No  Homicidal Thoughts:  No  Memory:  Immediate;   Good Recent;   Good Remote;   Good  Judgement:  Intact  Insight:  Good  Psychomotor Activity:  Decreased  Concentration:  Fair  Recall:  Good  Fund of Knowledge:  Good  Language:  Good  Akathisia:  No  Handed:  Right  AIMS (if indicated):     Assets:  Communication Skills Desire for Improvement Financial Resources/Insurance Housing Social Support  ADL's:  Intact  Cognition:  WNL  Sleep:        New problem, with additional work up planned, Review of Psycho-Social Stressors (1), Review or order clinical lab tests (1), Review and summation of old records (2), Established Problem, Worsening (2), New Problem, with no additional work-up planned (3), Review of Medication Regimen & Side Effects (2) and Review of New Medication or Change in Dosage (2)  Assessment: Axis I: Major depressive disorder, moderate.  Mood disorder due to general medical condition, rule out major depressive disorder with psychosis  Axis II: Deferred  Axis III:  Past Medical History  Diagnosis Date  . DVT (deep venous thrombosis) 2014  . MS (multiple sclerosis)   . MS (multiple sclerosis)   . Depression   . Anxiety      Plan:  I review his symptoms, history, collateral information from the emergency room visits, current medication and recent blood work results.  Patient is open to try antidepressives and to help his anxiety and depressive symptoms.  We will start Cymbalta 30 mg daily to help his depression and anxiety symptoms.  We discussed in length about medication side effects and benefits.  I encouraged to keep appointment with William Fitzgerald for counseling.  We will add low-dose into psychotic medication if patient continues to have hallucination and paranoia.  I recommended to call us back if he has any question, concern if he feel worsening of  the symptoms.  Discussed sleep hygiene, weight  maintenance and safety plan that anytime having active suicidal thoughts or homicidal thoughts and he need to call 911 or go to the local emergency room.  Discussed black box morning for antidepressives and in detail.  Time spent 55 minutes.  More than 50% of the time spent in psychoeducation, counseling" of care.  Also given enough time to ask questions regarding medication, diagnosis and prognosis.  Follow-up in 4 weeks.  Jhovani Griswold T., MD 10/28/2014

## 2014-11-11 ENCOUNTER — Ambulatory Visit (HOSPITAL_COMMUNITY): Payer: Self-pay | Admitting: Psychology

## 2014-12-07 ENCOUNTER — Encounter (HOSPITAL_COMMUNITY): Payer: Self-pay | Admitting: Psychology

## 2014-12-13 ENCOUNTER — Ambulatory Visit (HOSPITAL_COMMUNITY): Payer: Self-pay | Admitting: Psychiatry

## 2015-01-14 ENCOUNTER — Encounter (HOSPITAL_COMMUNITY): Payer: Self-pay | Admitting: Psychology

## 2015-01-14 DIAGNOSIS — F4010 Social phobia, unspecified: Secondary | ICD-10-CM

## 2015-01-14 DIAGNOSIS — F331 Major depressive disorder, recurrent, moderate: Secondary | ICD-10-CM

## 2015-01-14 NOTE — Progress Notes (Signed)
William Fitzgerald is a 29 y.o. male patient who is discharged from counseling as no longer active.  Outpatient Therapist Discharge Summary  William Fitzgerald    23-Aug-1985   Admission Date: 07/06/14   Discharge Date:  01/14/15 Reason for Discharge:  Not active since 08/26/14 Diagnosis:    Social anxiety disorder  Major depressive disorder, recurrent episode, moderate   Comments:  Pt didn't f/u in response to 12/07/14 letter.  Alfredo Batty, LPC

## 2015-05-24 ENCOUNTER — Ambulatory Visit (INDEPENDENT_AMBULATORY_CARE_PROVIDER_SITE_OTHER): Payer: 59 | Admitting: Emergency Medicine

## 2015-05-24 VITALS — BP 136/70 | HR 76 | Temp 98.5°F | Resp 16 | Ht 77.0 in | Wt 284.8 lb

## 2015-05-24 DIAGNOSIS — B029 Zoster without complications: Secondary | ICD-10-CM

## 2015-05-24 MED ORDER — VALACYCLOVIR HCL 1 G PO TABS: 1000.0000 mg | ORAL_TABLET | Freq: Three times a day (TID) | ORAL | Status: DC

## 2015-05-24 NOTE — Progress Notes (Signed)
Subjective:  Patient ID: William Fitzgerald, male    DOB: December 23, 1985  Age: 30 y.o. MRN: [id number]  CC: Rash   HPI William Fitzgerald presents  with a rash characteristic of shingles. He has a burning pruritic rash is vesicular in character. He denies any fever chills nausea vomiting or other complaints does have some myalgias. He has a history of chickenpox  History William Fitzgerald has a past medical history of DVT (deep venous thrombosis) (HCC) (2014); MS (multiple sclerosis) (HCC); MS (multiple sclerosis) (HCC); Depression; and Anxiety.   He has past surgical history that includes Vascular surgery (Right, 06/2013).   His  family history includes ADD / ADHD in his cousin; Diabetes in his maternal grandfather; Lung cancer in his maternal aunt and maternal aunt; Schizophrenia in his cousin; Seizures in his other.  He   reports that he has quit smoking. He has never used smokeless tobacco. He reports that he does not drink alcohol or use illicit drugs.  Outpatient Prescriptions Prior to Visit  Medication Sig Dispense Refill  . TECFIDERA 240 MG CPDR Take 480 mg by mouth daily.     . DULoxetine (CYMBALTA) 30 MG capsule Take 1 capsule (30 mg total) by mouth daily. 30 capsule 0   No facility-administered medications prior to visit.    Social History   Social History  . Marital Status: Single    Spouse Name: N/A  . Number of Children: 0  . Years of Education: 12th   Occupational History  .      [employer]   Social History Main Topics  . Smoking status: Former Games developer  . Smokeless tobacco: Never Used  . Alcohol Use: No  . Drug Use: No     Comment: quit marijuana 2014  . Sexual Activity: Yes    Birth Control/ Protection: Condom   Other Topics Concern  . None   Social History Narrative   Patient lives at home with family.   Caffeine Use: tea occasionally     Review of Systems  Constitutional: Negative for fever, chills and appetite change.  HENT: Negative for congestion,  ear pain, postnasal drip, sinus pressure and sore throat.   Eyes: Negative for pain and redness.  Respiratory: Negative for cough, shortness of breath and wheezing.   Cardiovascular: Negative for leg swelling.  Gastrointestinal: Negative for nausea, vomiting, abdominal pain, diarrhea, constipation and blood in stool.  Endocrine: Negative for polyuria.  Genitourinary: Negative for dysuria, urgency, frequency and flank pain.  Musculoskeletal: Negative for gait problem.  Skin: Positive for rash.  Neurological: Negative for weakness and headaches.  Psychiatric/Behavioral: Negative for confusion and decreased concentration. The patient is not nervous/anxious.     Objective:  BP 136/70 mmHg  Pulse 76  Temp(Src) 98.5 F (36.9 C) (Oral)  Resp 16  Ht  (1.956 m)  Wt 284 lb 12.8 oz (129.184 kg)  BMI 33.77 kg/m2  SpO2 98%  Physical Exam  Constitutional: He is oriented to person, place, and time. He appears well-developed and well-nourished. No distress.  HENT:  Head: Normocephalic and atraumatic.  Right Ear: External ear normal.  Left Ear: External ear normal.  Nose: Nose normal.  Eyes: Conjunctivae and EOM are normal. Pupils are equal, round, and reactive to light. No scleral icterus.  Neck: Normal range of motion. Neck supple. No tracheal deviation present.  Cardiovascular: Normal rate, regular rhythm and normal heart sounds.   Pulmonary/Chest: Effort normal. No respiratory distress. He has no wheezes. He has no  rales.  Abdominal: He exhibits no mass. There is no tenderness. There is no rebound and no guarding.  Musculoskeletal: He exhibits no edema.  Lymphadenopathy:    He has no cervical adenopathy.  Neurological: He is alert and oriented to person, place, and time. Coordination normal.  Skin: Skin is warm and dry. Rash (rash characteristic for shingles on right neck) noted.  Psychiatric: He has a normal mood and affect. His behavior is normal.      Assessment & Plan:    William Fitzgerald was seen today for rash.  Diagnoses and all orders for this visit:  Shingles  Other orders -     valACYclovir (VALTREX) 1000 MG tablet; Take 1 tablet (1,000 mg total) by mouth 3 (three) times daily.  I have discontinued William Fitzgerald's DULoxetine. I am also having him start on valACYclovir. Additionally, I am having him maintain his TECFIDERA.  Meds ordered this encounter  Medications  . valACYclovir (VALTREX) 1000 MG tablet    Sig: Take 1 tablet (1,000 mg total) by mouth 3 (three) times daily.    Dispense:  30 tablet    Refill:  0    Appropriate red flag conditions were discussed with the patient as well as actions that should be taken.  Patient expressed his understanding.  Follow-up: Return if symptoms worsen or fail to improve.  Carmelina Dane, MD

## 2015-05-24 NOTE — Patient Instructions (Signed)

## 2015-08-16 ENCOUNTER — Encounter: Payer: Self-pay | Admitting: Diagnostic Neuroimaging

## 2015-08-16 ENCOUNTER — Ambulatory Visit (INDEPENDENT_AMBULATORY_CARE_PROVIDER_SITE_OTHER): Payer: 59 | Admitting: Diagnostic Neuroimaging

## 2015-08-16 VITALS — BP 123/74 | HR 82 | Ht 77.5 in | Wt 294.0 lb

## 2015-08-16 DIAGNOSIS — R413 Other amnesia: Secondary | ICD-10-CM | POA: Diagnosis not present

## 2015-08-16 DIAGNOSIS — G35 Multiple sclerosis: Secondary | ICD-10-CM

## 2015-08-16 DIAGNOSIS — M62838 Other muscle spasm: Secondary | ICD-10-CM | POA: Diagnosis not present

## 2015-08-16 NOTE — Progress Notes (Signed)
[facility]  PATIENT: William Fitzgerald DOB: [birth year]  REFERRING CLINICIAN:  HISTORY FROM: patient  REASON FOR VISIT: follow up   HISTORICAL  CHIEF COMPLAINT:  Chief Complaint  Patient presents with  . Multiple Sclerosis    rm 6, "Tecfidera, 03/2014 MRI Brain; memory issues, no major issues since starting med, some muscle spasms"  . Follow-up    last seen 04/2014, "suicidal ideation 06/2014- self admitted; no episodes since"    HISTORY OF PRESENT ILLNESS:   UPDATE 08/16/15: Since last visit, has continued on tecfidera. Lost to follow up. Went to behavioral health for suicidal thoughts. Now doing much better from mood standpoint. Still with fatigue, vision changes, right sided spasms. Now in culinary school, and may be relocating to [place].   UPDATE 04/27/14: Since last visit, now on tecfidera for past 3 weeks, no side effects. Wants to return to work. Overall vision, strength, numbness are improving.  UPDATE 03/10/14: Since last visit, we tried to start gilenya, then it was denied by insurance, then patient was reluctant to start. He opted to take vitamin D only and not other MS meds. 2 weeks ago, developed new onset of fatigue, bilateral blurred vision, right arm weakness, right leg numbness and balance difficulty.   UPDATE 09/18/13: Since last visit, no new neuro symptoms. Some fluctuation of right arm/leg numbness and weakness. Testing results reviewed.  PRIOR HPI (08/24/13): 30 year old right-handed male here for evaluation of possible multiple sclerosis. 2010 patient had intermittent right leg stiffness and weakness. He was diagnosed with "blood clot" in his right leg, not treated with anticoagulation. He recently had laser vein removal on this leg. 2.5 weeks ago patient noted right arm and right leg weakness and numbness. He's also having dizziness when he gets out of bed. He's having more balance difficulty. Patient symptoms have gradually worsened. Patient  went to the emergency room for evaluation, had MRI of the brain which showed multiple supratentorial and infratentorial white matter lesions, suspicious for multiple sclerosis. Patient referred to me for further evaluation. No family history of multiple sclerosis. No episodes of unilateral visual loss. No slurred speech or trouble talking. No bowel or bladder incontinence.   REVIEW OF SYSTEMS: Full 14 system review of systems performed and negative with exception of: memory loss headache weakness speech diff back pain walking diff freq urination.   ALLERGIES: No Known Allergies  HOME MEDICATIONS: Outpatient Prescriptions Prior to Visit  Medication Sig Dispense Refill  . TECFIDERA 240 MG CPDR Take 480 mg by mouth daily.     . valACYclovir (VALTREX) 1000 MG tablet Take 1 tablet (1,000 mg total) by mouth 3 (three) times daily. 30 tablet 0   No facility-administered medications prior to visit.    PAST MEDICAL HISTORY: Past Medical History  Diagnosis Date  . DVT (deep venous thrombosis) ([place]) 2014  . MS (multiple sclerosis) ([place])   . MS (multiple sclerosis) ([place])   . Depression   . Anxiety     PAST SURGICAL HISTORY: Past Surgical History  Procedure Laterality Date  . Vascular surgery Right 06/2013  . Dental surgery  2016    FAMILY HISTORY: Family History  Problem Relation Age of Onset  . Lung cancer Maternal Aunt   . Diabetes Maternal Grandfather   . Lung cancer Maternal Aunt   . Schizophrenia Cousin   . ADD / ADHD Cousin   . Seizures Other     SOCIAL HISTORY:  Social History   Social History  . Marital Status: Single  Spouse Name: N/A  . Number of Children: 0  . Years of Education: 12th   Occupational History  .      [employer]   Social History Main Topics  . Smoking status: Former Research scientist (life sciences)  . Smokeless tobacco: Never Used  . Alcohol Use: No  . Drug Use: No     Comment: quit marijuana 2014  . Sexual Activity: Yes    Birth Control/ Protection: Condom     Other Topics Concern  . Not on file   Social History Narrative   Patient lives at home with family.   Caffeine Use: tea occasionally     PHYSICAL EXAM  GENERAL EXAM/CONSTITUTIONAL: Vitals:  Filed Vitals:   08/16/15 1434  BP: 123/74  Pulse: 82  Height: 6' 5.5" (1.969 m)  Weight: 294 lb (133.358 kg)    Wt Readings from Last 3 Encounters:  08/16/15 294 lb (133.358 kg)  05/24/15 284 lb 12.8 oz (129.184 kg)  10/28/14 291 lb 6.4 oz (132.178 kg)     Body mass index is 34.4 kg/(m^2).  No exam data present  Patient is in no distress; well developed, nourished and groomed; neck is supple  CARDIOVASCULAR:  Examination of carotid arteries is normal; no carotid bruits  Regular rate and rhythm, no murmurs  Examination of peripheral vascular system by observation and palpation is normal  EYES:  Ophthalmoscopic exam of optic discs and posterior segments is normal; no papilledema or hemorrhages  MUSCULOSKELETAL:  Gait, strength, tone, movements noted in Neurologic exam below  NEUROLOGIC: MENTAL STATUS:  No flowsheet data found.  awake, alert, oriented to person, place and time  recent and remote memory intact  normal attention and concentration  language fluent, comprehension intact, naming intact,   fund of knowledge appropriate  CRANIAL NERVE:   2nd - no papilledema on fundoscopic exam  2nd, 3rd, 4th, 6th - pupils equal and reactive to light, visual fields full to confrontation, extraocular muscles intact, no nystagmus  5th - facial sensation symmetric  7th - facial strength --> SLIGHT DECR LEFT LOWER FACIAL STRENGTH  8th - hearing intact  9th - palate elevates symmetrically, uvula midline  11th - shoulder shrug symmetric  12th - tongue protrusion midline  MOTOR:   normal bulk and tone, full strength in the BUE, BLE  SENSORY:   normal and symmetric to light touch, temperature, vibration  COORDINATION:   finger-nose-finger, fine finger  movements normal  REFLEXES:   deep tendon reflexes present and symmetric  GAIT/STATION:   narrow based gait     DIAGNOSTIC DATA (LABS, IMAGING, TESTING) - I reviewed patient records, labs, notes, testing and imaging myself where available.  Lab Results  Component Value Date   WBC 13.0* 09/21/2014   HGB 16.7 09/22/2014   HCT 49.0 09/22/2014   MCV 79.3 09/21/2014   PLT 272 09/21/2014      Component Value Date/Time   NA 140 09/22/2014 1548   K 4.2 09/22/2014 1548   CL 102 09/22/2014 1548   CO2 24 06/15/2014 1435   GLUCOSE 91 09/22/2014 1548   BUN 7 09/22/2014 1548   CREATININE 0.70 09/22/2014 1548   CALCIUM 9.5 06/15/2014 1435   PROT 7.4 06/15/2014 1435   PROT 7.1 09/18/2013 1427   ALBUMIN 4.3 06/15/2014 1435   ALBUMIN 4.5 09/18/2013 1427   AST 19 06/15/2014 1435   ALT 18 06/15/2014 1435   ALKPHOS 46 06/15/2014 1435   BILITOT 2.4* 06/15/2014 1435   GFRNONAA >90 06/15/2014 1435  GFRAA >90 06/15/2014 1435   VIT D, 25-HYDROXY  Date Value Ref Range Status  06/28/2014 19.1* 30.0 - 100.0 ng/mL Final    Comment:    Vitamin D deficiency has been defined by the [place] practice guideline as a level of serum 25-OH vitamin D less than 20 ng/mL (1,2). The Endocrine Society went on to further define vitamin D insufficiency as a level between 21 and 29 ng/mL (2). 1. IOM (Institute of Medicine). 2010. Dietary reference    intakes for calcium and D. [place]: The    [employer]. 2. Holick MF, Binkley Cody, Bischoff-Ferrari HA, et al.    Evaluation, treatment, and prevention of vitamin D    deficiency: an Endocrine Society clinical practice    guideline. JCEM. 2011 Jul; 96(7):1911-30.    No results found for: CHOL, HDL, LDLCALC, LDLDIRECT, TRIG, CHOLHDL No results found for: HGBA1C No results found for: VITAMINB12 No results found for: TSH   08/13/13 MRI brain (without) - Findings consistent with widespread  supratentorial fulminant acute multiple sclerosis. No dominant lesion to suggest tumefactive MS.   09/04/13 MRI brain (with and without) - Multiple acute and chronic, supratentorial and infratentorial chronic demyelinating plaques. No significant change from MRI on 08/13/13, although prior study did not include post-contrast views.  09/04/13 MRI cervical spine (with and without) - normal  09/04/13 MRI thoracic spine (with and without) - normal  03/16/14 MRI brain (with and without) - Highly abnormal MRI scan of the brain showing multiple supratentorial (periventricular, subcortical, juxtacortical, thalamic, basal ganglia) and infratentorial (midbrain, pontine, cerebellar) T2 hyperintensities, consistent with both active and chronic demyelinating plaques. Multiple enhancing lesions are noted. Compared with previous MRI dated 09/04/2013 there appears to be increase in overall acute and chronic disease activity with a new large right posterior frontal and enlarged right cerebellar cystic ring-enhancing lesion noted.  08/27/13 Visual Evoked Potentials - normal  08/24/13 Labs: ANA. ANCA. ESR, CRP, NMO, HIV, Hep B, Hep C, ACE, RPR, Lyme Ab - all negative    ASSESSMENT AND PLAN  30 y.o. year old male here with multiple sclerosis, with symptoms dating back to 2010. Confirmatory and rule out testing completed. Discussed diagnosis, prognosis and treatment options. Given clinical consideration, patient involvement, and JCV positive status, we decided on TECFIDERA (started Dec 2015). Overall stable since then.    Dx:   1. MS (multiple sclerosis) ([place])   2. Muscle spasm   3. Memory loss      PLAN: - addl testing - continue tecfidera and vit D  Orders Placed This Encounter  Procedures  . MR Brain W Wo Contrast  . CBC with Differential/Platelet  . Comprehensive metabolic panel  . VITAMIN D 25 Hydroxy (Vit-D Deficiency, Fractures)   Return in about 4 months (around 12/16/2015).    Penni Bombard, MD 0/34/7425, 9:56 PM Certified in Neurology, Neurophysiology and Neuroimaging  [facility] [address], [place], [place] [address] [phone]

## 2015-08-16 NOTE — Patient Instructions (Signed)
Thank you for coming to see [place] at [facility]. I hope we have been able to provide you high quality care today.  You may receive a patient satisfaction survey over the next few weeks. We would appreciate your feedback and comments so that we may continue to improve ourselves and the health of our patients.  - continue tecfidera and vitamin D   ~~~~~~~~~~~~~~~~~~~~~~~~~~~~~~~~~~~~~~~~~~~~~~~~~~~~~~~~~~~~~~~~~  DR. Tationa Stech'S GUIDE TO HAPPY AND HEALTHY LIVING These are some of my general health and wellness recommendations. Some of them may apply to you better than others. Please use common sense as you try these suggestions and feel free to ask me any questions.   ACTIVITY/FITNESS Mental, social, emotional and physical stimulation are very important for brain and body health. Try learning a new activity (arts, music, language, sports, games).  Keep moving your body to the best of your abilities. You can do this at home, inside or outside, the park, community center, gym or anywhere you like. Consider a physical therapist or personal trainer to get started. Consider the app Sworkit. Fitness trackers such as smart-watches, smart-phones or Fitbits can help as well.   NUTRITION Eat more plants: colorful vegetables, nuts, seeds and berries.  Eat less sugar, salt, preservatives and processed foods.  Avoid toxins such as cigarettes and alcohol.  Drink water when you are thirsty. Warm water with a slice of lemon is an excellent morning drink to start the day.  Consider these websites for more information The Nutrition Source (https://www.henry-hernandez.biz/) Precision Nutrition (WindowBlog.ch)   RELAXATION Consider practicing mindfulness meditation or other relaxation techniques such as deep breathing, prayer, yoga, tai chi, massage. See website mindful.org or the apps Headspace or Calm to help get started.   SLEEP Try to  get at least 7-8+ hours sleep per day. Regular exercise and reduced caffeine will help you sleep better. Practice good sleep hygeine techniques. See website sleep.org for more information.   PLANNING Prepare estate planning, living will, healthcare POA documents. Sometimes this is best planned with the help of an attorney. Theconversationproject.org and agingwithdignity.org are excellent resources.

## 2015-08-17 ENCOUNTER — Telehealth: Payer: Self-pay | Admitting: *Deleted

## 2015-08-17 LAB — COMPREHENSIVE METABOLIC PANEL
A/G RATIO: 2 (ref 1.2–2.2)
ALK PHOS: 52 IU/L (ref 39–117)
ALT: 23 IU/L (ref 0–44)
AST: 21 IU/L (ref 0–40)
Albumin: 4.6 g/dL (ref 3.5–5.5)
BUN/Creatinine Ratio: 13 (ref 9–20)
BUN: 9 mg/dL (ref 6–20)
Bilirubin Total: 1.9 mg/dL — ABNORMAL HIGH (ref 0.0–1.2)
CO2: 26 mmol/L (ref 18–29)
Calcium: 9.7 mg/dL (ref 8.7–10.2)
Chloride: 98 mmol/L (ref 96–106)
Creatinine, Ser: 0.68 mg/dL — ABNORMAL LOW (ref 0.76–1.27)
GFR calc Af Amer: 151 mL/min/{1.73_m2} (ref >59)
GFR calc non Af Amer: 131 mL/min/{1.73_m2} (ref >59)
GLUCOSE: 100 mg/dL — AB (ref 65–99)
Globulin, Total: 2.3 g/dL (ref 1.5–4.5)
Potassium: 5.1 mmol/L (ref 3.5–5.2)
Sodium: 141 mmol/L (ref 134–144)
Total Protein: 6.9 g/dL (ref 6.0–8.5)

## 2015-08-17 LAB — CBC WITH DIFFERENTIAL/PLATELET
Basophils Absolute: 0 10*3/uL (ref 0.0–0.2)
Basos: 1 %
EOS (ABSOLUTE): 0.1 10*3/uL (ref 0.0–0.4)
Eos: 1 %
Hematocrit: 43 % (ref 37.5–51.0)
Hemoglobin: 14 g/dL (ref 12.6–17.7)
Immature Grans (Abs): 0 10*3/uL (ref 0.0–0.1)
Immature Granulocytes: 0 %
Lymphocytes Absolute: 1.8 10*3/uL (ref 0.7–3.1)
Lymphs: 24 %
MCH: 26.4 pg — AB (ref 26.6–33.0)
MCHC: 32.6 g/dL (ref 31.5–35.7)
MCV: 81 fL (ref 79–97)
MONOS ABS: 0.5 10*3/uL (ref 0.1–0.9)
Monocytes: 6 %
NEUTROS ABS: 5.2 10*3/uL (ref 1.4–7.0)
Neutrophils: 68 %
PLATELETS: 330 10*3/uL (ref 150–379)
RBC: 5.31 x10E6/uL (ref 4.14–5.80)
RDW: 14.1 % (ref 12.3–15.4)
WBC: 7.6 10*3/uL (ref 3.4–10.8)

## 2015-08-17 LAB — VITAMIN D 25 HYDROXY (VIT D DEFICIENCY, FRACTURES): VIT D 25 HYDROXY: 27.8 ng/mL — AB (ref 30.0–100.0)

## 2015-08-17 NOTE — Telephone Encounter (Signed)
LVM informing patient that his lab results are unremarkable, Vit D is still low but improved from his previous lab. Advised he continue taking Vit D 2000 units daily. Left name, number for questions.

## 2015-08-17 NOTE — Telephone Encounter (Signed)
Continue current vitamin D. -VRP

## 2015-08-18 NOTE — Telephone Encounter (Signed)
Noted, thank you

## 2015-08-24 ENCOUNTER — Telehealth: Payer: Self-pay | Admitting: *Deleted

## 2015-08-24 NOTE — Telephone Encounter (Signed)
Spoke with patient and informed him Dr Marjory Lies wants him to come in for repeat lab, re: bilirubin , in one month. Informed him his bilirubin level was high, but has been higher in past. Gave him lab hours of operation. He verbalized understanding, agreement.

## 2015-09-13 ENCOUNTER — Other Ambulatory Visit: Payer: Self-pay | Admitting: *Deleted

## 2015-09-13 ENCOUNTER — Ambulatory Visit
Admission: RE | Admit: 2015-09-13 | Discharge: 2015-09-13 | Disposition: A | Discharge status: Home / Self Care | Payer: 59 | Source: Ambulatory Visit | Attending: Diagnostic Neuroimaging | Admitting: Diagnostic Neuroimaging

## 2015-09-13 ENCOUNTER — Telehealth: Payer: Self-pay | Admitting: *Deleted

## 2015-09-13 ENCOUNTER — Other Ambulatory Visit (INDEPENDENT_AMBULATORY_CARE_PROVIDER_SITE_OTHER): Payer: Self-pay

## 2015-09-13 DIAGNOSIS — R7989 Other specified abnormal findings of blood chemistry: Secondary | ICD-10-CM

## 2015-09-13 DIAGNOSIS — G35 Multiple sclerosis: Secondary | ICD-10-CM

## 2015-09-13 DIAGNOSIS — Z0289 Encounter for other administrative examinations: Secondary | ICD-10-CM

## 2015-09-13 MED ORDER — GADOBENATE DIMEGLUMINE 529 MG/ML IV SOLN
20.0000 mL | Freq: Once | INTRAVENOUS | Status: AC | PRN
  Administered 2015-09-13: 20 mL via INTRAVENOUS

## 2015-09-13 NOTE — Telephone Encounter (Signed)
Spoke with patient and reminded him of repeat lab Dr Marjory Lies ordered. Gave him lab hours of operation. He stated he would try to come today following his MRI. He verbalized understanding, appreciation of call.

## 2015-09-14 LAB — COMPREHENSIVE METABOLIC PANEL
ALT: 19 IU/L (ref 0–44)
AST: 23 IU/L (ref 0–40)
Albumin/Globulin Ratio: 2 (ref 1.2–2.2)
Albumin: 4.5 g/dL (ref 3.5–5.5)
Alkaline Phosphatase: 58 IU/L (ref 39–117)
BUN/Creatinine Ratio: 17 (ref 9–20)
BUN: 13 mg/dL (ref 6–20)
Bilirubin Total: 1.9 mg/dL — ABNORMAL HIGH (ref 0.0–1.2)
CO2: 27 mmol/L (ref 18–29)
CREATININE: 0.77 mg/dL (ref 0.76–1.27)
Calcium: 9.9 mg/dL (ref 8.7–10.2)
Chloride: 99 mmol/L (ref 96–106)
GFR, EST AFRICAN AMERICAN: 144 mL/min/{1.73_m2} (ref >59)
GFR, EST NON AFRICAN AMERICAN: 124 mL/min/{1.73_m2} (ref >59)
Globulin, Total: 2.3 g/dL (ref 1.5–4.5)
Glucose: 77 mg/dL (ref 65–99)
Potassium: 5 mmol/L (ref 3.5–5.2)
Sodium: 140 mmol/L (ref 134–144)
TOTAL PROTEIN: 6.8 g/dL (ref 6.0–8.5)

## 2015-09-20 ENCOUNTER — Telehealth: Payer: Self-pay | Admitting: Diagnostic Neuroimaging

## 2015-09-20 NOTE — Telephone Encounter (Signed)
I called patient. MRI scan has significantly progressed. Has been on tecfidera since Dec 2015. Will plan to switch to ocrevus.    Suanne Marker, MD 09/20/2015, 5:53 PM Certified in Neurology, Neurophysiology and Neuroimaging  [facility] [address] [place], [place] [address] [phone]

## 2015-09-21 NOTE — Telephone Encounter (Signed)
LVM informing patient that Dr Marjory Lies would like to see him sooner for follow up re: new medication and labs. Advised he call to schedule follow up with phone staff; gave name, number.

## 2015-09-22 ENCOUNTER — Telehealth: Payer: Self-pay | Admitting: *Deleted

## 2015-09-22 NOTE — Telephone Encounter (Signed)
Spoke with patient per Dr Marjory Lies and scheduled him for earlier FU to sign papers for new MS medication and have repeat labs. Patient will be out of town x 2-3 weeks in June, so scheduled for asap when he returns.  Patient verbalized understanding, appreciation.

## 2015-09-27 ENCOUNTER — Other Ambulatory Visit: Payer: Self-pay

## 2015-10-31 ENCOUNTER — Ambulatory Visit (INDEPENDENT_AMBULATORY_CARE_PROVIDER_SITE_OTHER): Payer: 59 | Admitting: Diagnostic Neuroimaging

## 2015-10-31 ENCOUNTER — Encounter: Payer: Self-pay | Admitting: Diagnostic Neuroimaging

## 2015-10-31 VITALS — BP 117/73 | HR 87 | Ht 77.0 in | Wt 303.0 lb

## 2015-10-31 DIAGNOSIS — M62838 Other muscle spasm: Secondary | ICD-10-CM | POA: Diagnosis not present

## 2015-10-31 DIAGNOSIS — R413 Other amnesia: Secondary | ICD-10-CM | POA: Diagnosis not present

## 2015-10-31 DIAGNOSIS — F32A Depression, unspecified: Secondary | ICD-10-CM

## 2015-10-31 DIAGNOSIS — F329 Major depressive disorder, single episode, unspecified: Secondary | ICD-10-CM | POA: Diagnosis not present

## 2015-10-31 DIAGNOSIS — G35 Multiple sclerosis: Secondary | ICD-10-CM

## 2015-10-31 NOTE — Patient Instructions (Signed)
-   stop tecfidera (10/31/15)  - start ocrevus

## 2015-10-31 NOTE — Progress Notes (Signed)
[facility]  PATIENT: William Fitzgerald DOB: 03-12-1986  REFERRING CLINICIAN:  HISTORY FROM: patient  REASON FOR VISIT: follow up   HISTORICAL  CHIEF COMPLAINT:  Chief Complaint  Patient presents with  . Multiple Sclerosis    rm 7, changing MS med, repeat lab  . Follow-up    early follow up, MRI brain 09/13/15    HISTORY OF PRESENT ILLNESS:   UPDATE 10/31/15: Since last visit, symptoms and MRI have progressed while on tecfidera. Also had some nausea / reaction to IV gadolinium contrast with last MRI on 09/13/15. Here to discuss switching tecfidera to ocrevus. Depression stable. Has been hiking up [place] with his family (cousins). 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   UPDATE 08/16/15: Since last visit, has continued on tecfidera. Lost to follow up. Went to behavioral health for suicidal thoughts. Now doing much better from mood standpoint. Still with fatigue, vision changes, right sided spasms. Now in culinary school, and may be relocating to [place].   UPDATE 04/27/14: Since last visit, now on tecfidera for past 3 weeks, no side effects. Wants to return to work. Overall vision, strength, numbness are improving.  UPDATE 03/10/14: Since last visit, we tried to start gilenya, then it was denied by insurance, then patient was reluctant to start. He opted to take vitamin D only and not other MS meds. 2 weeks ago, developed new onset of fatigue, bilateral blurred vision, right arm weakness, right leg numbness and balance difficulty.   UPDATE 09/18/13: Since last visit, no new neuro symptoms. Some fluctuation of right arm/leg numbness and weakness. Testing results reviewed.  PRIOR HPI (08/24/13): 30 year old right-handed male here for evaluation of possible multiple sclerosis. 2010 patient had intermittent right leg stiffness and weakness. He was diagnosed with "blood clot" in his right leg, not treated with anticoagulation. He recently had  laser vein removal on this leg. 2.5 weeks ago patient noted right arm and right leg weakness and numbness. He's also having dizziness when he gets out of bed. He's having more balance difficulty. Patient symptoms have gradually worsened. Patient went to the emergency room for evaluation, had MRI of the brain which showed multiple supratentorial and infratentorial white matter lesions, suspicious for multiple sclerosis. Patient referred to me for further evaluation. No family history of multiple sclerosis. No episodes of unilateral visual loss. No slurred speech or trouble talking. No bowel or bladder incontinence.   REVIEW OF SYSTEMS: Full 14 system review of systems performed and negative with exception of: memory loss headache weakness speech diff back pain walking diff freq urination.   ALLERGIES: Allergies  Allergen Reactions  . Gadolinium Derivatives Nausea Only    Pt became nauseous after contrast    HOME MEDICATIONS: Outpatient Prescriptions Prior to Visit  Medication Sig Dispense Refill  . Cholecalciferol (VITAMIN D) 2000 units CAPS Take 2,000 Units by mouth daily.    . TECFIDERA 240 MG CPDR Take 480 mg by mouth daily.      No facility-administered medications prior to visit.    PAST MEDICAL HISTORY: Past Medical History  Diagnosis Date  . DVT (deep venous thrombosis) ([place]) 2014  . MS (multiple sclerosis) ([place])   . MS (multiple sclerosis) ([place])   . Depression   . Anxiety     PAST SURGICAL HISTORY: Past Surgical History  Procedure Laterality Date  . Vascular surgery Right 06/2013  . Dental surgery  2016    FAMILY HISTORY: Family History  Problem Relation  Age of Onset  . Lung cancer Maternal Aunt   . Diabetes Maternal Grandfather   . Lung cancer Maternal Aunt   . Schizophrenia Cousin   . ADD / ADHD Cousin   . Seizures Other     SOCIAL HISTORY:  Social History   Social History  . Marital Status: Single    Spouse Name: N/A  . Number of Children: 0  . Years  of Education: 12th   Occupational History  .      [employer]   Social History Main Topics  . Smoking status: Former Research scientist (life sciences)  . Smokeless tobacco: Never Used  . Alcohol Use: No  . Drug Use: No     Comment: quit marijuana 2014  . Sexual Activity: Yes    Birth Control/ Protection: Condom   Other Topics Concern  . Not on file   Social History Narrative   Patient lives at home with family.   Caffeine Use: tea occasionally     PHYSICAL EXAM  GENERAL EXAM/CONSTITUTIONAL: Vitals:  Filed Vitals:   10/31/15 0855  BP: 117/73  Pulse: 87  Height: 6' 5"  (1.956 m)  Weight: 303 lb (137.44 kg)   Wt Readings from Last 3 Encounters:  10/31/15 303 lb (137.44 kg)  08/16/15 294 lb (133.358 kg)  05/24/15 284 lb 12.8 oz (129.184 kg)    Body mass index is 35.92 kg/(m^2).  Visual Acuity Screening   Right eye Left eye Both eyes  Without correction:     With correction: 20/40 20/50     Patient is in no distress; well developed, nourished and groomed; neck is supple  CARDIOVASCULAR:  Examination of carotid arteries is normal; no carotid bruits  Regular rate and rhythm, no murmurs  Examination of peripheral vascular system by observation and palpation is normal  EYES:  Ophthalmoscopic exam of optic discs and posterior segments is normal; no papilledema or hemorrhages  MUSCULOSKELETAL:  Gait, strength, tone, movements noted in Neurologic exam below  NEUROLOGIC: MENTAL STATUS:  No flowsheet data found.  awake, alert, oriented to person, place and time  recent and remote memory intact  normal attention and concentration  language fluent, comprehension intact, naming intact,   fund of knowledge appropriate  CRANIAL NERVE:   2nd - no papilledema on fundoscopic exam  2nd, 3rd, 4th, 6th - pupils equal and reactive to light, visual fields full to confrontation, extraocular muscles intact, no nystagmus  5th - facial sensation symmetric  7th - facial strength -->  SLIGHT DECR LEFT LOWER FACIAL STRENGTH  8th - hearing intact  9th - palate elevates symmetrically, uvula midline  11th - shoulder shrug symmetric  12th - tongue protrusion midline  MOTOR:   normal bulk and tone, full strength in the BUE, BLE  SENSORY:   normal and symmetric to light touch, temperature, vibration  COORDINATION:   finger-nose-finger, fine finger movements normal  FOOT TAPPING SLOW BILATERALL  REFLEXES:   deep tendon reflexes TRACE and symmetric  GAIT/STATION:   narrow based gait; DECREASED ARM SWING     DIAGNOSTIC DATA (LABS, IMAGING, TESTING) - I reviewed patient records, labs, notes, testing and imaging myself where available.  Lab Results  Component Value Date   WBC 7.6 08/16/2015   HGB 16.7 09/22/2014   HCT 43.0 08/16/2015   MCV 81 08/16/2015   PLT 330 08/16/2015      Component Value Date/Time   NA 140 09/13/2015 1433   NA 140 09/22/2014 1548   K 5.0 09/13/2015 1433  CL 99 09/13/2015 1433   CO2 27 09/13/2015 1433   GLUCOSE 77 09/13/2015 1433   GLUCOSE 91 09/22/2014 1548   BUN 13 09/13/2015 1433   BUN 7 09/22/2014 1548   CREATININE 0.77 09/13/2015 1433   CALCIUM 9.9 09/13/2015 1433   PROT 6.8 09/13/2015 1433   PROT 7.4 06/15/2014 1435   ALBUMIN 4.5 09/13/2015 1433   ALBUMIN 4.3 06/15/2014 1435   AST 23 09/13/2015 1433   ALT 19 09/13/2015 1433   ALKPHOS 58 09/13/2015 1433   BILITOT 1.9* 09/13/2015 1433   BILITOT 2.4* 06/15/2014 1435   GFRNONAA 124 09/13/2015 1433   GFRAA 144 09/13/2015 1433   VIT D, 25-HYDROXY  Date Value Ref Range Status  08/16/2015 27.8* 30.0 - 100.0 ng/mL Final    Comment:    Vitamin D deficiency has been defined by the Institute of Medicine and an Endocrine Society practice guideline as a level of serum 25-OH vitamin D less than 20 ng/mL (1,2). The Endocrine Society went on to further define vitamin D insufficiency as a level between 21 and 29 ng/mL (2). 1. IOM (Institute of Medicine). 2010. Dietary  reference    intakes for calcium and D. [place]: The    [employer]. 2. Holick MF, Binkley , Bischoff-Ferrari HA, et al.    Evaluation, treatment, and prevention of vitamin D    deficiency: an Endocrine Society clinical practice    guideline. JCEM. 2011 Jul; 96(7):1911-30.    No results found for: CHOL, HDL, LDLCALC, LDLDIRECT, TRIG, CHOLHDL No results found for: HGBA1C No results found for: VITAMINB12 No results found for: TSH   08/13/13 MRI brain (without) - Findings consistent with widespread supratentorial fulminant acute multiple sclerosis. No dominant lesion to suggest tumefactive MS.   09/04/13 MRI brain (with and without) - Multiple acute and chronic, supratentorial and infratentorial chronic demyelinating plaques. No significant change from MRI on 08/13/13, although prior study did not include post-contrast views.  09/04/13 MRI cervical spine (with and without) - normal  09/04/13 MRI thoracic spine (with and without) - normal  03/16/14 MRI brain (with and without) - Highly abnormal MRI scan of the brain showing multiple supratentorial (periventricular, subcortical, juxtacortical, thalamic, basal ganglia) and infratentorial (midbrain, pontine, cerebellar) T2 hyperintensities, consistent with both active and chronic demyelinating plaques. Multiple enhancing lesions are noted. Compared with previous MRI dated 09/04/2013 there appears to be increase in overall acute and chronic disease activity with a new large right posterior frontal and enlarged right cerebellar cystic ring-enhancing lesion noted.  09/13/15 MRI brain (with and without) [I reviewed images myself and agree with interpretation. -VRP]  1. Multiple cerebellar, brain stem, deep gray matter and hemispheric white matter lesions in a pattern and configuration consistent with demyelinating plaques associated with multiple sclerosis. 5 of the foci enhance after contrast administration consistent with more acute  foci. 2. When compared to the MRI from 09/04/2013, there are many more deep and periventricular foci, most of which do not enhance. The multiple enhancing lesions on the older MRI no longer enhance and they appear smaller on the current scan.  08/27/13 Visual Evoked Potentials - normal  08/24/13 Labs: ANA. ANCA. ESR, CRP, NMO, HIV, Hep B, Hep C, ACE, RPR, Lyme Ab - all negative    ASSESSMENT AND PLAN  30 y.o. year old male here with multiple sclerosis, with symptoms dating back to 2010. Confirmatory and rule out testing completed. Discussed diagnosis, prognosis and treatment options. Given clinical consideration, patient involvement, and JCV positive status,  we decided on TECFIDERA (started Dec 2015). Since then was doing well until 2017, now more left sided symptoms and also progression of MRI scan. Will switch tecfidera to ocrevus.   Dx:   1. MS (multiple sclerosis) ([place])   2. Muscle spasm   3. Memory loss   4. Depression      PLAN:  - stop tecfidera (10/31/15) - start ocrevus (~4-6 week washout from tecfidera) - check labs in preparation for switch - monitor depression and muscle spasm symptoms - continue vitamin D  Orders Placed This Encounter  Procedures  . Hep B Surface Antibody  . Hep B Surface Antigen  . Hepatitis B Core AB, Total  . CBC with Differential/Platelet  . Comprehensive metabolic panel  . Stratify JCV Antibody Test ([facility])  . VITAMIN D 25 Hydroxy (Vit-D Deficiency, Fractures)   Return in about 2 months (around 12/31/2015).  I reviewed images, labs, notes, records myself. I summarized findings and reviewed with patient, for this high risk condition (multiple sclerosis progression / exacerbation) requiring high complexity decision making.     Penni Bombard, MD 12/15/5724, 2:03 AM Certified in Neurology, Neurophysiology and Neuroimaging  [facility] [address], [place], [place] [address] [phone]

## 2015-11-01 LAB — COMPREHENSIVE METABOLIC PANEL
A/G RATIO: 1.8 (ref 1.2–2.2)
ALK PHOS: 55 IU/L (ref 39–117)
ALT: 21 IU/L (ref 0–44)
AST: 23 IU/L (ref 0–40)
Albumin: 4.5 g/dL (ref 3.5–5.5)
BUN / CREAT RATIO: 18 (ref 9–20)
BUN: 12 mg/dL (ref 6–20)
Bilirubin Total: 1.6 mg/dL — ABNORMAL HIGH (ref 0.0–1.2)
CO2: 21 mmol/L (ref 18–29)
Calcium: 9.5 mg/dL (ref 8.7–10.2)
Chloride: 103 mmol/L (ref 96–106)
Creatinine, Ser: 0.65 mg/dL — ABNORMAL LOW (ref 0.76–1.27)
GFR calc Af Amer: 154 mL/min/{1.73_m2} (ref >59)
GFR calc non Af Amer: 133 mL/min/{1.73_m2} (ref >59)
GLOBULIN, TOTAL: 2.5 g/dL (ref 1.5–4.5)
Glucose: 112 mg/dL — ABNORMAL HIGH (ref 65–99)
POTASSIUM: 4.7 mmol/L (ref 3.5–5.2)
SODIUM: 140 mmol/L (ref 134–144)
Total Protein: 7 g/dL (ref 6.0–8.5)

## 2015-11-01 LAB — CBC WITH DIFFERENTIAL/PLATELET
BASOS: 1 %
Basophils Absolute: 0.1 10*3/uL (ref 0.0–0.2)
EOS (ABSOLUTE): 0.1 10*3/uL (ref 0.0–0.4)
Eos: 1 %
HEMATOCRIT: 44 % (ref 37.5–51.0)
Hemoglobin: 14.2 g/dL (ref 12.6–17.7)
Immature Grans (Abs): 0 10*3/uL (ref 0.0–0.1)
Immature Granulocytes: 0 %
Lymphocytes Absolute: 1.5 10*3/uL (ref 0.7–3.1)
Lymphs: 27 %
MCH: 26.2 pg — ABNORMAL LOW (ref 26.6–33.0)
MCHC: 32.3 g/dL (ref 31.5–35.7)
MCV: 81 fL (ref 79–97)
MONOCYTES: 8 %
MONOS ABS: 0.4 10*3/uL (ref 0.1–0.9)
Neutrophils Absolute: 3.5 10*3/uL (ref 1.4–7.0)
Neutrophils: 63 %
Platelets: 298 10*3/uL (ref 150–379)
RBC: 5.42 x10E6/uL (ref 4.14–5.80)
RDW: 14.5 % (ref 12.3–15.4)
WBC: 5.6 10*3/uL (ref 3.4–10.8)

## 2015-11-01 LAB — VITAMIN D 25 HYDROXY (VIT D DEFICIENCY, FRACTURES): VIT D 25 HYDROXY: 29.9 ng/mL — AB (ref 30.0–100.0)

## 2015-11-01 LAB — HEPATITIS B CORE ANTIBODY, TOTAL: Hep B Core Total Ab: NEGATIVE

## 2015-11-01 LAB — HEPATITIS B SURFACE ANTIBODY,QUALITATIVE: HEP B SURFACE AB, QUAL: REACTIVE

## 2015-11-01 LAB — HEPATITIS B SURFACE ANTIGEN: Hepatitis B Surface Ag: NEGATIVE

## 2015-11-02 ENCOUNTER — Telehealth: Payer: Self-pay | Admitting: *Deleted

## 2015-11-02 NOTE — Telephone Encounter (Signed)
Spoke with patient and informed him, per Dr Marjory Lies, his lab results are unremarkable. Vit D is borderline low, advised he continue taking Vit D 2000 units daily. Informed him that when JCV results are back, he will get a call. He verbalized understanding, agreement,  appreciation.

## 2015-11-09 ENCOUNTER — Telehealth: Payer: Self-pay | Admitting: *Deleted

## 2015-11-09 NOTE — Telephone Encounter (Signed)
Per Dr Marjory Lies, spoke with patient and informed him his JCV lab result is positive. Advised him Dr Marjory Lies checked this lab in order to have a baseline. Dr Marjory Lies will continue with Ocrevus medication treatment. Informed him Dr [name] will recheck JCV during his treament with Ocrevus.  Patient verbalized understanding.

## 2015-11-21 ENCOUNTER — Telehealth: Payer: Self-pay | Admitting: Diagnostic Neuroimaging

## 2015-11-21 ENCOUNTER — Encounter: Payer: Self-pay | Admitting: *Deleted

## 2015-11-21 NOTE — Telephone Encounter (Signed)
Pt called to advise he needs a verification letter for college stating when he was dx with MS and he is being treated for it. He will need this before 12/06/15.

## 2015-11-21 NOTE — Telephone Encounter (Signed)
LVM informing patient that Dr Marjory Lies is out of the office until 12/01/15, and this RN will have letter for him to sign at that time. Advised he call back if this will not work. Left name, number.

## 2015-11-22 ENCOUNTER — Encounter: Payer: Self-pay | Admitting: *Deleted

## 2015-12-05 NOTE — Telephone Encounter (Signed)
Spoke with patient and informed him the letter he requested is in envelope and ready for pick up at the front desk. He verbalized understanding, appreciation.

## 2015-12-07 ENCOUNTER — Encounter (HOSPITAL_COMMUNITY): Payer: Self-pay

## 2015-12-07 ENCOUNTER — Emergency Department (HOSPITAL_COMMUNITY)
Admission: EM | Admit: 2015-12-07 | Discharge: 2015-12-07 | Disposition: A | Discharge status: Home / Self Care | Payer: 59 | Attending: Emergency Medicine | Admitting: Emergency Medicine

## 2015-12-07 DIAGNOSIS — Z87891 Personal history of nicotine dependence: Secondary | ICD-10-CM | POA: Insufficient documentation

## 2015-12-07 DIAGNOSIS — R519 Headache, unspecified: Secondary | ICD-10-CM

## 2015-12-07 DIAGNOSIS — R51 Headache: Secondary | ICD-10-CM | POA: Diagnosis not present

## 2015-12-07 LAB — CBC WITH DIFFERENTIAL/PLATELET
BASOS ABS: 0 10*3/uL (ref 0.0–0.1)
Basophils Relative: 0 %
Eosinophils Absolute: 0 10*3/uL (ref 0.0–0.7)
Eosinophils Relative: 0 %
HCT: 47.5 % (ref 39.0–52.0)
HEMOGLOBIN: 15.4 g/dL (ref 13.0–17.0)
LYMPHS ABS: 0.6 10*3/uL — AB (ref 0.7–4.0)
LYMPHS PCT: 5 %
MCH: 26.7 pg (ref 26.0–34.0)
MCHC: 32.4 g/dL (ref 30.0–36.0)
MCV: 82.3 fL (ref 78.0–100.0)
Monocytes Absolute: 0.3 10*3/uL (ref 0.1–1.0)
Monocytes Relative: 2 %
Neutro Abs: 10.6 10*3/uL — ABNORMAL HIGH (ref 1.7–7.7)
Neutrophils Relative %: 92 %
Platelets: 236 10*3/uL (ref 150–400)
RBC: 5.77 MIL/uL (ref 4.22–5.81)
RDW: 13.8 % (ref 11.5–15.5)
WBC: 11.5 10*3/uL — AB (ref 4.0–10.5)

## 2015-12-07 LAB — COMPREHENSIVE METABOLIC PANEL
ALT: 16 U/L — ABNORMAL LOW (ref 17–63)
AST: 21 U/L (ref 15–41)
Albumin: 4.1 g/dL (ref 3.5–5.0)
Alkaline Phosphatase: 49 U/L (ref 38–126)
Anion gap: 7 (ref 5–15)
BUN: 9 mg/dL (ref 6–20)
CHLORIDE: 105 mmol/L (ref 101–111)
CO2: 22 mmol/L (ref 22–32)
Calcium: 9.2 mg/dL (ref 8.9–10.3)
Creatinine, Ser: 0.69 mg/dL (ref 0.61–1.24)
Glucose, Bld: 109 mg/dL — ABNORMAL HIGH (ref 65–99)
POTASSIUM: 3.9 mmol/L (ref 3.5–5.1)
Sodium: 134 mmol/L — ABNORMAL LOW (ref 135–145)
TOTAL PROTEIN: 6.9 g/dL (ref 6.5–8.1)
Total Bilirubin: 2.7 mg/dL — ABNORMAL HIGH (ref 0.3–1.2)

## 2015-12-07 MED ORDER — SODIUM CHLORIDE 0.9 % IV BOLUS (SEPSIS)
500.0000 mL | Freq: Once | INTRAVENOUS | Status: AC
  Administered 2015-12-07: 500 mL via INTRAVENOUS

## 2015-12-07 MED ORDER — KETOROLAC TROMETHAMINE 30 MG/ML IJ SOLN
30.0000 mg | Freq: Once | INTRAMUSCULAR | Status: AC
  Administered 2015-12-07: 30 mg via INTRAVENOUS
  Filled 2015-12-07: qty 1

## 2015-12-07 NOTE — Discharge Instructions (Signed)
Read the information below.  You may return to the Emergency Department at any time for worsening condition or any new symptoms that concern you.  ° °You are having a headache. No specific cause was found today for your headache. It may have been a migraine or other cause of headache. Stress, anxiety, fatigue, and depression are common triggers for headaches. Your headache today does not appear to be life-threatening or require hospitalization, but often the exact cause of headaches is not determined in the emergency department. Therefore, follow-up with your doctor is very important to find out what may have caused your headache, and whether or not you need any further diagnostic testing or treatment. Sometimes headaches can appear benign (not harmful), but then more serious symptoms can develop which should prompt an immediate re-evaluation by your doctor or the emergency department. °SEEK MEDICAL ATTENTION IF: °You develop possible problems with medications prescribed.  °The medications don't resolve your headache, if it recurs , or if you have multiple episodes of vomiting or can't take fluids. °You have a change from the usual headache. °RETURN IMMEDIATELY IF you develop a sudden, severe headache or confusion, become poorly responsive or faint, develop a fever above 100.4F or problem breathing, have a change in speech, vision, swallowing, or understanding, or develop new weakness, numbness, tingling, incoordination, or have a seizure.  °

## 2015-12-07 NOTE — ED Triage Notes (Signed)
Pt reports he woke up with headache and chills. Hx of MS, and has had no meds in 30 days and is starting a new treatment tomorrow.

## 2015-12-07 NOTE — ED Provider Notes (Signed)
[facility]EMERGENCY DEPT Provider Note   CSN: [id number] Arrival date & time: 12/07/15  1027  First Provider Contact:  First MD Initiated Contact with Patient 12/07/15 1554        History   Chief Complaint Chief Complaint  Patient presents with  . Migraine  . Chills    HPI William Fitzgerald is a 30 y.o. male.  HPI   Patient with hx MS p/w chills this morning, intermittent left sided headache that began today.  The headache is left sided and comes and goes, lasts about 30 seconds at a time.  Felt cold when he woke up, but notes he had the fan on.  Has been off of his MS medications for a month, awaiting new treatment planned for tomorrow.  Has chronic generalized weakness and gait imbalance with his MS that is unchanged.  Denies URI symptoms, vomiting, diarrhea, dysuria, skin complaints.    Past Medical History:  Diagnosis Date  . Anxiety   . Depression   . DVT (deep venous thrombosis) ([facility]) 2014  . MS (multiple sclerosis) ([facility])   . MS (multiple sclerosis) ([facility])     Patient Active Problem List   Diagnosis Date Noted  . Major depressive disorder, recurrent episode ([facility]) 07/06/2014  . Social anxiety disorder 07/06/2014  . Multiple sclerosis ([facility]) 08/24/2013  . Nonspecific (abnormal) findings on radiological and other examination of skull and head 08/24/2013  . Disturbance of skin sensation 08/24/2013  . Other malaise and fatigue 08/24/2013  . Spasm of muscle 08/24/2013    Past Surgical History:  Procedure Laterality Date  . DENTAL SURGERY  2016  . VASCULAR SURGERY Right 06/2013       Home Medications    Prior to Admission medications   Medication Sig Start Date End Date Taking? Authorizing Provider  Cholecalciferol (VITAMIN D) 2000 units CAPS Take 2,000 Units by mouth daily.    Historical Provider, MD  TECFIDERA 240 MG CPDR Take 480 mg by mouth daily.  04/21/14   Historical Provider, MD    Family History Family History  Problem Relation Age of Onset  . Seizures  Other   . Lung cancer Maternal Aunt   . Diabetes Maternal Grandfather   . Lung cancer Maternal Aunt   . Schizophrenia Cousin   . ADD / ADHD Cousin     Social History Social History  Substance Use Topics  . Smoking status: Former Games developer  . Smokeless tobacco: Never Used  . Alcohol use No     Allergies   Gadolinium derivatives   Review of Systems Review of Systems  All other systems reviewed and are negative.    Physical Exam Updated Vital Signs BP (!) 117/52   Pulse 90   Temp 99.1 F (37.3 C) (Oral)   Resp 19   Ht 6\' 6"  (1.981 m)   Wt 135.4 kg   SpO2 100%   BMI 34.51 kg/m   Physical Exam  Constitutional: He appears well-developed and well-nourished. No distress.  HENT:  Head: Normocephalic and atraumatic.  Right Ear: Tympanic membrane normal.  Left Ear: Tympanic membrane normal.  Bilateral canals erythematous without discharge  Neck: Normal range of motion. Neck supple. No Brudzinski's sign and no Kernig's sign noted.  Cardiovascular: Normal rate and regular rhythm.   Pulmonary/Chest: Effort normal and breath sounds normal. No respiratory distress. He has no wheezes. He has no rales.  Abdominal: Soft. He exhibits no distension and no mass. There is no tenderness. There is no rebound and no guarding.  Neurological: He is alert. He exhibits normal muscle tone.  CN II-XII intact, EOMs intact, no pronator drift, grip strengths equal bilaterally; strength 5/5 in all extremities, sensation intact in all extremities; finger to nose is normal.     Skin: He is not diaphoretic.  Nursing note and vitals reviewed.    ED Treatments / Results  Labs (all labs ordered are listed, but only abnormal results are displayed) Labs Reviewed  COMPREHENSIVE METABOLIC PANEL - Abnormal; Notable for the following:       Result Value   Sodium 134 (*)    Glucose, Bld 109 (*)    ALT 16 (*)    Total Bilirubin 2.7 (*)    All other components within normal limits  CBC WITH  DIFFERENTIAL/PLATELET - Abnormal; Notable for the following:    WBC 11.5 (*)    Neutro Abs 10.6 (*)    Lymphs Abs 0.6 (*)    All other components within normal limits    EKG  EKG Interpretation None       Radiology No results found.  Procedures Procedures (including critical care time)  Medications Ordered in ED Medications - No data to display   Initial Impression / Assessment and Plan / ED Course  I have reviewed the triage vital signs and the nursing notes.  Pertinent labs & imaging results that were available during my care of the patient were reviewed by me and considered in my medical decision making (see chart for details).  Clinical Course  Comment By Time  I discussed the patient with Dr Adela Lank, ED attending.  Discussed patient with neurohospitalists, Dr Roxy Manns.  Symptoms not consistent with MS flare, does not recommend starting new MS treatments, does not recommend LP currently. Trixie Dredge, PA-C 08/02 1635  Patient reports he would like to go home now.  He is planning to see his neurologist tomorrow - I recommended that he call and discuss these symptoms today to ensure Dr Marjory Lies still wants to start his new medication. Trixie Dredge, PA-C 08/02 1703    Afebrile nontoxic patient who woke up this morning feeling cold and heaving an intermittent headache.  No red flags for headache.  Possible general headache vs early viral process.  Doubt meningitis, MS flare, acute intracranial bleed. Pt has depression but denies SI or any worsening symptoms.  Will see his neurologist tomorrow.  D/C home.  Discussed result, findings, treatment, and follow up  with patient.  Pt given return precautions.  Pt verbalizes understanding and agrees with plan.      Final Clinical Impressions(s) / ED Diagnoses   Final diagnoses:  Acute nonintractable headache, unspecified headache type    New Prescriptions Discharge Medication List as of 12/07/2015  5:14 PM       Trixie Dredge,  PA-C 12/07/15 1911    Melene Plan, DO 12/07/15 2310

## 2015-12-07 NOTE — ED Notes (Signed)
Pt is in stable condition upon d/c and is escorted from ED via wheelchair. 

## 2015-12-15 ENCOUNTER — Ambulatory Visit: Payer: 59 | Admitting: Diagnostic Neuroimaging

## 2015-12-15 ENCOUNTER — Ambulatory Visit (INDEPENDENT_AMBULATORY_CARE_PROVIDER_SITE_OTHER): Payer: 59 | Admitting: Psychology

## 2015-12-15 ENCOUNTER — Encounter (HOSPITAL_COMMUNITY): Payer: Self-pay | Admitting: Psychology

## 2015-12-15 DIAGNOSIS — F4010 Social phobia, unspecified: Secondary | ICD-10-CM | POA: Diagnosis not present

## 2015-12-15 DIAGNOSIS — F330 Major depressive disorder, recurrent, mild: Secondary | ICD-10-CM | POA: Diagnosis not present

## 2015-12-15 NOTE — Progress Notes (Signed)
Comprehensive Clinical Assessment (CCA) Note  12/15/2015 William Fitzgerald [id number]  Visit Diagnosis:      ICD-9-CM ICD-10-CM   1. Social anxiety disorder 300.23 F40.10   2. 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  (See scanned document in Chart Review)  CCA Part Two A  Intake/Chief Complaint:  CCA Intake With Chief Complaint CCA Part Two Date: 12/15/15 CCA Part Two Time: 0910 Chief Complaint/Presenting Problem: pt reports he is returning for counseling as over past couple of months increased anxiety and depressive symptoms.  Pt reported that he stopped coming to counseling with this provider in 2016 as he felt he was doing well and managing well.  pt reported this continued through till about June 2017 when he f/u w/ her provider re: MRI scan for his MS and they informed that worsened.  pt reported he experienced feeling helpless and return of negative thoughts of life not worth it just give up.   Pt reported also another stressors was coworker who served as Dance movement psychotherapist to him died from overdose 2 days before his birthday.  pt reported this saddened him as well.  pt reported that SI hasn't maintained but increased anxiety has.  pt identifies that low self confidence also plays a role for him.   Patients Currently Reported Symptoms/Problems: pt reports daily anxiety, worries, difficulty concentrating, irritability.  pt reports avoidance of people/places because of anxiety.  Pt reported that prefers to be to self and always has been this way.  pt reports does find a lot of comfort from nature and enjoys cooking.  pt acknowledges that he needs to focus more on self and his own self care.  pt reports no SI in a month.  pt reports couple depressed days a week or two.  pt reports low energy.  pt still engaging w/ friends and still engaging in activities he enjoys.  pt denies any sleep distrubance,  denies any paranoia and denies and hallucinations.  Collateral Involvement: previous tx notes Individual's Strengths: pt enjoys nature, pt enjoys cooking, pt seeking services. pt goal directed for culinary school.  Individual's Preferences: I want improve my anxiety and stress levels- calm the stress and anxiety, not worry so much.   pt wants to increase self confidence. Type of Services Patient Feels Are Needed: counseling and alternative tx- pt wants to attempt w/out medication.   Mental Health Symptoms Depression:  Depression: Difficulty Concentrating, Fatigue, Hopelessness, Irritability  Mania:  Mania: N/A  Anxiety:   Anxiety: Difficulty concentrating, Fatigue, Irritability, Worrying, Tension  Psychosis:  Psychosis: N/A  Trauma:  Trauma: N/A  Obsessions:  Obsessions: N/A  Compulsions:  Compulsions: N/A  Inattention:  Inattention: N/A  Hyperactivity/Impulsivity:  Hyperactivity/Impulsivity: N/A  Oppositional/Defiant Behaviors:  Oppositional/Defiant Behaviors: N/A  Borderline Personality:  Emotional Irregularity: N/A  Other Mood/Personality Symptoms:      Mental Status Exam Appearance and self-care  Stature:  Stature: Tall  Weight:  Weight: Average weight  Clothing:  Clothing: Casual, Neat/clean  Grooming:  Grooming: Normal  Cosmetic use:  Cosmetic Use: None  Posture/gait:  Posture/Gait: Normal  Motor activity:  Motor Activity: Not Remarkable  Sensorium  Attention:  Attention: Normal  Concentration:  Concentration: Normal  Orientation:  Orientation: X5  Recall/memory:  Recall/Memory: Normal  Affect and Mood  Affect:  Affect: Appropriate  Mood:  Mood: Anxious, Depressed  Relating  Eye contact:  Eye Contact:  Normal  Facial expression:  Facial Expression: Responsive  Attitude toward examiner:  Attitude Toward Examiner: Cooperative  Thought and Language  Speech flow: Speech Flow: Normal  Thought content:  Thought Content: Appropriate to mood and circumstances  Preoccupation:      Hallucinations:     Organization:     Company secretary of Knowledge:  Fund of Knowledge: Average  Intelligence:  Intelligence: Average  Abstraction:  Abstraction: Normal  Judgement:  Judgement: Normal  Reality Testing:  Reality Testing: Adequate  Insight:  Insight: Good  Decision Making:  Decision Making: Normal  Social Functioning  Social Maturity:  Social Maturity: Isolates  Social Judgement:  Social Judgement: Normal  Stress  Stressors:  Stressors: Work, Illness, Grief/losses (school)  Coping Ability:  Coping Ability: Building surveyor Deficits:     Supports:      Family and Psychosocial History: Family history Marital status: Single Does patient have children?: No  Childhood History:  Childhood History By whom was/is the patient raised?: Both parents Additional childhood history information: Parents separated when pt was 17y/o.   Description of patient's relationship with caregiver when they were a child: positive relationship w/ parents. Patient's description of current relationship with people who raised him/her: pt reports he is closest to his maternal grandmother.   Does patient have siblings?: Yes Number of Siblings: 2 Description of patient's current relationship with siblings: Pt has a 45 y/o sister and a 29y/o brother.  pt reports close to both siblings. 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  Did You Have An Individualized Education Program (IIEP): No Did You Have Any Difficulty At School?: No  Religion: Religion/Spirituality Are You A Religious Person?: No  Leisure/Recreation: Leisure / Recreation Leisure and Hobbies: nature, cooking  Exercise/Diet: Exercise/Diet Do You Exercise?: Yes What Type of Exercise Do You Do?: Run/Walk How Many Times a Week Do You Exercise?: 1-3 times a week Have You Gained or Lost A Significant Amount of Weight in the Past Six Months?: No Do You Follow a Special Diet?: No Do You Have Any Trouble Sleeping?: No  CCA Part Two C  Alcohol/Drug Use:                        CCA Part Three  ASAM's:  Six Dimensions of Multidimensional Assessment  Dimension 1:  Acute Intoxication and/or Withdrawal Potential:     Dimension 2:  Biomedical Conditions and Complications:     Dimension 3:  Emotional,  Behavioral, or Cognitive Conditions and Complications:     Dimension 4:  Readiness to Change:     Dimension 5:  Relapse, Continued use, or Continued Problem Potential:     Dimension 6:  Recovery/Living Environment:      Substance use Disorder (SUD)    Social Function:  Social Functioning Social Maturity: Isolates Social Judgement: Normal  Stress:  Stress Stressors: Work, Illness, Grief/losses (school) Coping Ability: Overwhelmed Patient Takes Medications The Way The Doctor Instructed?: Yes Priority Risk: Low Acuity  Risk Assessment- Self-Harm Potential: Risk  Assessment For Self-Harm Potential Thoughts of Self-Harm: No current thoughts Method: No plan  Risk Assessment -Dangerous to Others Potential: Risk Assessment For Dangerous to Others Potential Method: No Plan  DSM5 Diagnoses: Patient Active Problem List   Diagnosis Date Noted  . Major depressive disorder, recurrent episode (HCC) 07/06/2014  . Social anxiety disorder 07/06/2014  . Multiple sclerosis (HCC) 08/24/2013  . Nonspecific (abnormal) findings on radiological and other examination of skull and head 08/24/2013  . Disturbance of skin sensation 08/24/2013  . Other malaise and fatigue 08/24/2013  . Spasm of muscle 08/24/2013    Patient Centered Plan: Patient is on the following Treatment Plan(s):  Anxiety and Depression see tx plan on file  Recommendations for Services/Supports/Treatments: Recommendations for Services/Supports/Treatments Recommendations For Services/Supports/Treatments: Individual Therapy  Treatment Plan Summary:    Pt to f/u w/ bieweekly counseling to address Social Anxiety and MDD.  Counselor and pt discussed referral for medication management.  Pt wants to start w/ counseling and add medication management if needed.   Forde Radon

## 2016-01-03 ENCOUNTER — Ambulatory Visit: Payer: 59 | Admitting: Diagnostic Neuroimaging

## 2016-01-04 ENCOUNTER — Encounter: Payer: Self-pay | Admitting: Diagnostic Neuroimaging

## 2016-01-10 ENCOUNTER — Ambulatory Visit (INDEPENDENT_AMBULATORY_CARE_PROVIDER_SITE_OTHER): Payer: 59 | Admitting: Diagnostic Neuroimaging

## 2016-01-10 ENCOUNTER — Encounter: Payer: Self-pay | Admitting: Diagnostic Neuroimaging

## 2016-01-10 VITALS — BP 119/70 | HR 76 | Wt 299.4 lb

## 2016-01-10 DIAGNOSIS — F329 Major depressive disorder, single episode, unspecified: Secondary | ICD-10-CM

## 2016-01-10 DIAGNOSIS — R413 Other amnesia: Secondary | ICD-10-CM

## 2016-01-10 DIAGNOSIS — G35 Multiple sclerosis: Secondary | ICD-10-CM | POA: Diagnosis not present

## 2016-01-10 DIAGNOSIS — F32A Depression, unspecified: Secondary | ICD-10-CM

## 2016-01-10 NOTE — Progress Notes (Signed)
[facility]  PATIENT: William Fitzgerald DOB: February 15, 1986  REFERRING CLINICIAN:  HISTORY FROM: patient  REASON FOR VISIT: follow up   HISTORICAL  CHIEF COMPLAINT:  Chief Complaint  Patient presents with  . Multiple Sclerosis    rm 7, discuss medication, "headaches off and on, mostly left upper area;  I hit my head a lot at work"  . Follow-up    2 month    HISTORY OF PRESENT ILLNESS:   UPDATE 01/10/16: Since last visit, now on ocrevus; tolerated infusion. Muscle spasms have calmed down. Feels much better than on tecfidera. Having some mood swings and memory issues, but stable and going through therapy.   UPDATE 10/31/15: Since last visit, symptoms and MRI have progressed while on tecfidera. Also had some nausea / reaction to IV gadolinium contrast with last MRI on 09/13/15. Here to discuss switching tecfidera to ocrevus. Depression stable. Has been hiking up [place] with his family (cousins). 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   UPDATE 08/16/15: Since last visit, has continued on tecfidera. Lost to follow up. Went to behavioral health for suicidal thoughts. Now doing much better from mood standpoint. Still with fatigue, vision changes, right sided spasms. Now in culinary school, and may be relocating to [place].   UPDATE 04/27/14: Since last visit, now on tecfidera for past 3 weeks, no side effects. Wants to return to work. Overall vision, strength, numbness are improving.  UPDATE 03/10/14: Since last visit, we tried to start gilenya, then it was denied by insurance, then patient was reluctant to start. He opted to take vitamin D only and not other MS meds. 2 weeks ago, developed new onset of fatigue, bilateral blurred vision, right arm weakness, right leg numbness and balance difficulty.   UPDATE 09/18/13: Since last visit, no new neuro symptoms. Some fluctuation of right arm/leg numbness and weakness. Testing results  reviewed.  PRIOR HPI (08/24/13): 30 year old right-handed male here for evaluation of possible multiple sclerosis. 2010 patient had intermittent right leg stiffness and weakness. He was diagnosed with "blood clot" in his right leg, not treated with anticoagulation. He recently had laser vein removal on this leg. 2.5 weeks ago patient noted right arm and right leg weakness and numbness. He's also having dizziness when he gets out of bed. He's having more balance difficulty. Patient symptoms have gradually worsened. Patient went to the emergency room for evaluation, had MRI of the brain which showed multiple supratentorial and infratentorial white matter lesions, suspicious for multiple sclerosis. Patient referred to me for further evaluation. No family history of multiple sclerosis. No episodes of unilateral visual loss. No slurred speech or trouble talking. No bowel or bladder incontinence.   REVIEW OF SYSTEMS: Full 14 system review of systems performed and negative with exception of: memory loss headache weakness speech diff back pain walking diff.   ALLERGIES: Allergies  Allergen Reactions  . Gadolinium Derivatives Nausea Only    Pt became nauseous after contrast    HOME MEDICATIONS: Outpatient Medications Prior to Visit  Medication Sig Dispense Refill  . Cholecalciferol (VITAMIN D) 2000 units CAPS Take 2,000 Units by mouth daily.     No facility-administered medications prior to visit.     PAST MEDICAL HISTORY: Past Medical History:  Diagnosis Date  . Anxiety   . Depression   . DVT (deep venous thrombosis) ([place]) 2014  . MS (multiple sclerosis) ([place])   . MS (multiple sclerosis) ([place])     PAST SURGICAL  HISTORY: Past Surgical History:  Procedure Laterality Date  . DENTAL SURGERY  2016  . VASCULAR SURGERY Right 06/2013    FAMILY HISTORY: Family History  Problem Relation Age of Onset  . Seizures Other   . Lung cancer Maternal Aunt   . Diabetes Maternal Grandfather   . Lung  cancer Maternal Aunt   . Schizophrenia Cousin   . ADD / ADHD Cousin     SOCIAL HISTORY:  Social History   Social History  . Marital status: Single    Spouse name: N/A  . Number of children: 0  . Years of education: 12th   Occupational History  .  [employer]   Social History Main Topics  . Smoking status: Former Research scientist (life sciences)  . Smokeless tobacco: Never Used  . Alcohol use No  . Drug use: No     Comment: quit marijuana 2014  . Sexual activity: Yes    Birth control/ protection: Condom   Other Topics Concern  . Not on file   Social History Narrative   Patient lives at home with family.   Caffeine Use: tea occasionally     PHYSICAL EXAM  GENERAL EXAM/CONSTITUTIONAL: Vitals:  Vitals:   01/10/16 1442  BP: 119/70  Pulse: 76  Weight: 299 lb 6.4 oz (135.8 kg)   Wt Readings from Last 3 Encounters:  01/10/16 299 lb 6.4 oz (135.8 kg)  12/07/15 298 lb 9.6 oz (135.4 kg)  10/31/15 (!) 303 lb (137.4 kg)    Body mass index is 34.6 kg/m. No exam data present  Patient is in no distress; well developed, nourished and groomed; neck is supple  CARDIOVASCULAR:  Examination of carotid arteries is normal; no carotid bruits  Regular rate and rhythm, no murmurs  Examination of peripheral vascular system by observation and palpation is normal  EYES:  Ophthalmoscopic exam of optic discs and posterior segments is normal; no papilledema or hemorrhages  MUSCULOSKELETAL:  Gait, strength, tone, movements noted in Neurologic exam below  NEUROLOGIC: MENTAL STATUS:  No flowsheet data found.  awake, alert, oriented to person, place and time  recent and remote memory intact  normal attention and concentration  language fluent, comprehension intact, naming intact,   fund of knowledge appropriate  CRANIAL NERVE:   2nd - no papilledema on fundoscopic exam  2nd, 3rd, 4th, 6th - pupils equal and reactive to light, visual fields full to confrontation,  extraocular muscles intact, no nystagmus  5th - facial sensation symmetric  7th - facial strength --> SLIGHT DECR LEFT LOWER FACIAL STRENGTH  8th - hearing intact  9th - palate elevates symmetrically, uvula midline  11th - shoulder shrug symmetric  12th - tongue protrusion midline  MOTOR:   normal bulk and tone, full strength in the BUE, BLE  SENSORY:   normal and symmetric to light touch, temperature, vibration  COORDINATION:   finger-nose-finger, fine finger movements normal  FOOT TAPPING SLOW BILATERALL  REFLEXES:   deep tendon reflexes TRACE and symmetric  GAIT/STATION:   narrow based gait; DECREASED ARM SWING     DIAGNOSTIC DATA (LABS, IMAGING, TESTING) - I reviewed patient records, labs, notes, testing and imaging myself where available.  Lab Results  Component Value Date   WBC 11.5 (H) 12/07/2015   HGB 15.4 12/07/2015   HCT 47.5 12/07/2015   MCV 82.3 12/07/2015   PLT 236 12/07/2015      Component Value Date/Time   NA 134 (L) 12/07/2015 1056   NA  140 10/31/2015 1027   K 3.9 12/07/2015 1056   CL 105 12/07/2015 1056   CO2 22 12/07/2015 1056   GLUCOSE 109 (H) 12/07/2015 1056   BUN 9 12/07/2015 1056   BUN 12 10/31/2015 1027   CREATININE 0.69 12/07/2015 1056   CALCIUM 9.2 12/07/2015 1056   PROT 6.9 12/07/2015 1056   PROT 7.0 10/31/2015 1027   ALBUMIN 4.1 12/07/2015 1056   ALBUMIN 4.5 10/31/2015 1027   AST 21 12/07/2015 1056   ALT 16 (L) 12/07/2015 1056   ALKPHOS 49 12/07/2015 1056   BILITOT 2.7 (H) 12/07/2015 1056   BILITOT 1.6 (H) 10/31/2015 1027   GFRNONAA >60 12/07/2015 1056   GFRAA >60 12/07/2015 1056   Vit D, 25-Hydroxy  Date Value Ref Range Status  10/31/2015 29.9 (L) 30.0 - 100.0 ng/mL Final    Comment:    Vitamin D deficiency has been defined by the Institute of Medicine and an Endocrine Society practice guideline as a level of serum 25-OH vitamin D less than 20 ng/mL (1,2). The Endocrine Society went on to further define  vitamin D insufficiency as a level between 21 and 29 ng/mL (2). 1. IOM (Institute of Medicine). 2010. Dietary reference    intakes for calcium and D. [place]: The    [employer]. 2. Holick MF, Binkley [place], Bischoff-Ferrari HA, et al.    Evaluation, treatment, and prevention of vitamin D    deficiency: an Endocrine Society clinical practice    guideline. JCEM. 2011 Jul; 96(7):1911-30.    No results found for: CHOL, HDL, LDLCALC, LDLDIRECT, TRIG, CHOLHDL No results found for: HGBA1C No results found for: VITAMINB12 No results found for: TSH   08/13/13 MRI brain (without) - Findings consistent with widespread supratentorial fulminant acute multiple sclerosis. No dominant lesion to suggest tumefactive MS.   09/04/13 MRI brain (with and without) - Multiple acute and chronic, supratentorial and infratentorial chronic demyelinating plaques. No significant change from MRI on 08/13/13, although prior study did not include post-contrast views.  09/04/13 MRI cervical spine (with and without) - normal  09/04/13 MRI thoracic spine (with and without) - normal  03/16/14 MRI brain (with and without) - Highly abnormal MRI scan of the brain showing multiple supratentorial (periventricular, subcortical, juxtacortical, thalamic, basal ganglia) and infratentorial (midbrain, pontine, cerebellar) T2 hyperintensities, consistent with both active and chronic demyelinating plaques. Multiple enhancing lesions are noted. Compared with previous MRI dated 09/04/2013 there appears to be increase in overall acute and chronic disease activity with a new large right posterior frontal and enlarged right cerebellar cystic ring-enhancing lesion noted.  09/13/15 MRI brain (with and without) [I reviewed images myself and agree with interpretation. -VRP]  1. Multiple cerebellar, brain stem, deep gray matter and hemispheric white matter lesions in a pattern and configuration consistent with demyelinating plaques  associated with multiple sclerosis. 5 of the foci enhance after contrast administration consistent with more acute foci. 2. When compared to the MRI from 09/04/2013, there are many more deep and periventricular foci, most of which do not enhance. The multiple enhancing lesions on the older MRI no longer enhance and they appear smaller on the current scan.  08/27/13 Visual Evoked Potentials - normal  08/24/13 Labs: ANA. ANCA. ESR, CRP, NMO, HIV, Hep B, Hep C, ACE, RPR, Lyme Ab - all negative    ASSESSMENT AND PLAN  30 y.o. year old male here with multiple sclerosis, with symptoms dating back to 2010. Confirmatory and rule out testing completed. Discussed diagnosis, prognosis and treatment  options. Given clinical consideration, patient involvement, and JCV positive status, we decided on TECFIDERA (started Dec 2015). Since then was doing well until 2017, now more left sided symptoms and also progression of MRI scan. Now tecfidera switched to ocrevus in August 2017 and doing well.    Dx:   1. MS (multiple sclerosis) ([place])   2. Memory loss   3. Depression      PLAN:  - continue ocrevus (every 6 months) - monitor depression and muscle spasm symptoms - continue vitamin D  Return in about 4 months (around 05/11/2016).    Penni Bombard, MD 06/10/8248, 0:37 PM Certified in Neurology, Neurophysiology and Neuroimaging  [facility] [address] [place], [place] [address] [phone]

## 2016-01-10 NOTE — Patient Instructions (Signed)
continue vitamin D

## 2016-01-11 ENCOUNTER — Ambulatory Visit (INDEPENDENT_AMBULATORY_CARE_PROVIDER_SITE_OTHER): Payer: 59 | Admitting: Psychology

## 2016-01-11 DIAGNOSIS — F330 Major depressive disorder, recurrent, mild: Secondary | ICD-10-CM | POA: Diagnosis not present

## 2016-01-11 DIAGNOSIS — F4010 Social phobia, unspecified: Secondary | ICD-10-CM | POA: Diagnosis not present

## 2016-01-11 NOTE — Progress Notes (Signed)
   THERAPIST PROGRESS NOTE  Session Time: 12.30pm-1.18pm  Participation Level: Active  Behavioral Response: Well GroomedAlertAnxious  Type of Therapy: Individual Therapy  Treatment Goals addressed: Diagnosis: Social Anxiety and goal 1  Interventions: CBT and Supportive  Summary: William Fitzgerald is a 30 y.o. male who presents with report of some improvement w/ anxiety and less avoidance- however still experiencing social anxiety and some avoidance.  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  Pt was able to identify small steps to success for self w/ social interactions he is wanting and not trying to make self something he is not- ie extrovert.  Pt also discussed self confidence and always his biggest critic w/ his food.  Pt agreed to work on accepting a compliment and allowing to soak in- not minimize.     Suicidal/Homicidal: Nowithout intent/plan  Therapist Response: Assessed pt current functioning per pt report.  Processed w/pt social interacitons, level of anxiety, successes he has had.  Discussed setting self up for success and not failure and accepting who he is- not beating self up.  Practiced how to take a compliment w/ pt and benefit it can have.   Plan: Return again in 2 weeks.  Diagnosis: Social Anxiety DO and MDD, mild    William Fitzgerald, LPC 01/11/2016

## 2016-01-25 ENCOUNTER — Ambulatory Visit (INDEPENDENT_AMBULATORY_CARE_PROVIDER_SITE_OTHER): Payer: 59 | Admitting: Psychology

## 2016-01-25 DIAGNOSIS — F330 Major depressive disorder, recurrent, mild: Secondary | ICD-10-CM

## 2016-01-25 DIAGNOSIS — F4010 Social phobia, unspecified: Secondary | ICD-10-CM

## 2016-01-25 NOTE — Progress Notes (Signed)
   THERAPIST PROGRESS NOTE  Session Time: 12.35pm-1.25pm  Participation Level: Active  Behavioral Response: Well GroomedAlertAnxious  Type of Therapy: Individual Therapy  Treatment Goals addressed: Diagnosis: Social Anxiety, MDD and goal 1.  Interventions: CBT and Strength-based  Summary: William Fitzgerald is a 30 y.o. male who presents with affect WNL.  Pt reports that he hasn't had depressed mood, but continuing to struggle w/ anxiety- socially.  Pt reported that he has been out socially w/ her close cousin and friend and made a point to be more vocal at work.  Pt reports some days feels better with this and progress and other days withdrawing and worrying too much what other's think.  Pt discussed how he avoided the family bowling event- cousins have invited on [place] trip to visit sister etc- but pt reports likely won't go because of social anxiety. Pt identified ways of setting up successes w/ environment and supports to assist in building success cycle.  Pt was able to identify distortions about focus on what others are thinking and reframe.    Suicidal/Homicidal: Nowithout intent/plan  Therapist Response: Assessed pt current functioning per pt report.  Processed w/pt improved w/ depression and continued struggles w/ anxiety.  Assisted pt in identifying success and creating cycle of success.  Also assisted in cognitive distortion awareness and reframing.   Plan: Return again in 3 weeks.  Diagnosis: Social Anxiety and MDD    Forde Radon, [facility] 01/25/2016

## 2016-02-15 ENCOUNTER — Ambulatory Visit (HOSPITAL_COMMUNITY): Payer: Self-pay | Admitting: Psychology

## 2016-05-14 ENCOUNTER — Ambulatory Visit: Payer: 59 | Admitting: Diagnostic Neuroimaging

## 2016-06-04 ENCOUNTER — Ambulatory Visit (HOSPITAL_COMMUNITY)
Admission: EM | Admit: 2016-06-04 | Discharge: 2016-06-04 | Disposition: A | Discharge status: Home / Self Care | Payer: 59 | Attending: Family Medicine | Admitting: Family Medicine

## 2016-06-04 ENCOUNTER — Encounter (HOSPITAL_COMMUNITY): Payer: Self-pay | Admitting: Family Medicine

## 2016-06-04 DIAGNOSIS — R69 Illness, unspecified: Secondary | ICD-10-CM

## 2016-06-04 DIAGNOSIS — J111 Influenza due to unidentified influenza virus with other respiratory manifestations: Secondary | ICD-10-CM

## 2016-06-04 MED ORDER — OSELTAMIVIR PHOSPHATE 75 MG PO CAPS: 75.0000 mg | ORAL_CAPSULE | Freq: Two times a day (BID) | ORAL | 0 refills | Status: DC

## 2016-06-04 NOTE — ED Triage Notes (Signed)
Pt  Reports   Body   Aches     Chills        Stuffy  Nose       With  Some  Sinus  Drainage   With  Onset   Of  Symptoms  Today   When  He  Went  To   Work       He  Has  A  History  Of  MS

## 2016-06-04 NOTE — Discharge Instructions (Signed)
You may take ibuprofen or Tylenol if you start to run a fever. It's important to start the flu medicine tonight.

## 2016-06-04 NOTE — ED Provider Notes (Signed)
[facility]    CSN: [id number] Arrival date & time: 06/04/16  2725     History   Chief Complaint Chief Complaint  Patient presents with  . Cough    HPI William Fitzgerald is a 31 y.o. male.   This is a 31 year old man who presents to the [facility] urgent care center with symptoms of the flu. The congestion and cough began this morning. f patient works at [employer] and several of his fellow employees have come down with the flu. 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  Patient has a history of multiple sclerosis.      Past Medical History:  Diagnosis Date  . Anxiety   . Depression   . DVT (deep venous thrombosis) (HCC) 2014  . MS (multiple sclerosis) (HCC)   . MS (multiple sclerosis) (HCC)     Patient Active Problem List   Diagnosis Date Noted  . Major depressive disorder, recurrent episode (HCC) 07/06/2014  . Social anxiety disorder 07/06/2014  . Multiple sclerosis (HCC) 08/24/2013  . Nonspecific (abnormal) findings on radiological and other examination of skull and head 08/24/2013  . Disturbance of skin sensation 08/24/2013  . Other malaise and fatigue 08/24/2013  . Spasm of muscle 08/24/2013    Past Surgical History:  Procedure Laterality Date  . DENTAL SURGERY  2016  . VASCULAR SURGERY Right 06/2013       Home Medications    Prior to Admission medications   Medication Sig Start Date End Date Taking? Authorizing Provider  Cholecalciferol (VITAMIN D) 2000 units CAPS Take 2,000 Units by mouth daily.    Historical Provider, MD  oseltamivir (TAMIFLU) 75 MG capsule Take 1 capsule (75 mg total) by mouth every 12 (twelve) hours. 06/04/16   Elvina Sidle, MD    Family History Family History  Problem Relation Age of Onset  . Seizures Other   . Lung cancer Maternal Aunt   . Diabetes Maternal Grandfather   . Lung cancer Maternal Aunt   . Schizophrenia Cousin    . ADD / ADHD Cousin     Social History Social History  Substance Use Topics  . Smoking status: Former Games developer  . Smokeless tobacco: Never Used  . Alcohol use No     Allergies   Gadolinium derivatives   Review of Systems Review of Systems  Constitutional: Positive for activity change and fatigue. Negative for fever.  HENT: Positive for congestion and sore throat.   Respiratory: Positive for cough.   Musculoskeletal: Negative.   Neurological: Negative.      Physical Exam Triage Vital Signs ED Triage Vitals  Enc Vitals Group     BP      Pulse      Resp      Temp      Temp src      SpO2      Weight      Height      Head Circumference      Peak Flow      Pain Score      Pain Loc      Pain Edu?      Excl. in GC?    No data found.   Updated Vital Signs BP 126/89 (BP Location: Right Arm)   Pulse 115   Temp 98.5 F (36.9 C) (Oral)   Resp 20   SpO2 100%  Physical Exam  Constitutional: He is oriented to person, place, and time. He appears well-developed and well-nourished.  HENT:  Head: Normocephalic.  Right Ear: External ear normal.  Left Ear: External ear normal.  Nose: Nose normal.  Mouth/Throat: Oropharynx is clear and moist.  Eyes: Conjunctivae and EOM are normal. Pupils are equal, round, and reactive to light.  Neck: Normal range of motion. Neck supple.  Cardiovascular: Normal rate, regular rhythm and normal heart sounds.   Pulmonary/Chest: Effort normal and breath sounds normal.  Musculoskeletal: Normal range of motion.  Neurological: He is alert and oriented to person, place, and time.  Skin: Skin is warm and dry.  Nursing note and vitals reviewed.    UC Treatments / Results  Labs (all labs ordered are listed, but only abnormal results are displayed) Labs Reviewed - No data to display  EKG  EKG Interpretation None       Radiology No results found.  Procedures Procedures (including critical care time)  Medications Ordered in  UC Medications - No data to display   Initial Impression / Assessment and Plan / UC Course  I have reviewed the triage vital signs and the nursing notes.  Pertinent labs & imaging results that were available during my care of the patient were reviewed by me and considered in my medical decision making (see chart for details).     Final Clinical Impressions(s) / UC Diagnoses   Final diagnoses:  Influenza-like illness    New Prescriptions New Prescriptions   OSELTAMIVIR (TAMIFLU) 75 MG CAPSULE    Take 1 capsule (75 mg total) by mouth every 12 (twelve) hours.     Elvina Sidle, MD 06/04/16 1944

## 2016-06-07 ENCOUNTER — Emergency Department (HOSPITAL_COMMUNITY)
Admission: EM | Admit: 2016-06-07 | Discharge: 2016-06-07 | Disposition: A | Discharge status: Home / Self Care | Payer: 59 | Attending: Emergency Medicine | Admitting: Emergency Medicine

## 2016-06-07 ENCOUNTER — Encounter (HOSPITAL_COMMUNITY): Payer: Self-pay | Admitting: Psychology

## 2016-06-07 ENCOUNTER — Encounter (HOSPITAL_COMMUNITY): Payer: Self-pay

## 2016-06-07 ENCOUNTER — Emergency Department (HOSPITAL_COMMUNITY): Payer: 59

## 2016-06-07 DIAGNOSIS — Z87891 Personal history of nicotine dependence: Secondary | ICD-10-CM | POA: Insufficient documentation

## 2016-06-07 DIAGNOSIS — E860 Dehydration: Secondary | ICD-10-CM | POA: Insufficient documentation

## 2016-06-07 DIAGNOSIS — R55 Syncope and collapse: Secondary | ICD-10-CM | POA: Diagnosis present

## 2016-06-07 DIAGNOSIS — J111 Influenza due to unidentified influenza virus with other respiratory manifestations: Secondary | ICD-10-CM | POA: Insufficient documentation

## 2016-06-07 DIAGNOSIS — R69 Illness, unspecified: Secondary | ICD-10-CM

## 2016-06-07 LAB — I-STAT CG4 LACTIC ACID, ED: LACTIC ACID, VENOUS: 0.91 mmol/L (ref 0.5–1.9)

## 2016-06-07 LAB — URINALYSIS, ROUTINE W REFLEX MICROSCOPIC
Bacteria, UA: NONE SEEN
Bilirubin Urine: NEGATIVE
Glucose, UA: NEGATIVE mg/dL
Hgb urine dipstick: NEGATIVE
KETONES UR: 5 mg/dL — AB
Leukocytes, UA: NEGATIVE
Nitrite: NEGATIVE
PH: 6 (ref 5.0–8.0)
Protein, ur: 30 mg/dL — AB
Specific Gravity, Urine: 1.027 (ref 1.005–1.030)

## 2016-06-07 LAB — BASIC METABOLIC PANEL
Anion gap: 12 (ref 5–15)
BUN: 8 mg/dL (ref 6–20)
CHLORIDE: 99 mmol/L — AB (ref 101–111)
CO2: 23 mmol/L (ref 22–32)
CREATININE: 0.92 mg/dL (ref 0.61–1.24)
Calcium: 8.8 mg/dL — ABNORMAL LOW (ref 8.9–10.3)
GFR calc Af Amer: >60 mL/min (ref >60)
GFR calc non Af Amer: >60 mL/min (ref >60)
GLUCOSE: 147 mg/dL — AB (ref 65–99)
Potassium: 3.5 mmol/L (ref 3.5–5.1)
Sodium: 134 mmol/L — ABNORMAL LOW (ref 135–145)

## 2016-06-07 LAB — CBC
HCT: 45.8 % (ref 39.0–52.0)
Hemoglobin: 15.3 g/dL (ref 13.0–17.0)
MCH: 26.8 pg (ref 26.0–34.0)
MCHC: 33.4 g/dL (ref 30.0–36.0)
MCV: 80.4 fL (ref 78.0–100.0)
PLATELETS: 198 10*3/uL (ref 150–400)
RBC: 5.7 MIL/uL (ref 4.22–5.81)
RDW: 14.2 % (ref 11.5–15.5)
WBC: 7.8 10*3/uL (ref 4.0–10.5)

## 2016-06-07 LAB — CBG MONITORING, ED: Glucose-Capillary: 116 mg/dL — ABNORMAL HIGH (ref 65–99)

## 2016-06-07 MED ORDER — KETOROLAC TROMETHAMINE 30 MG/ML IJ SOLN
15.0000 mg | Freq: Once | INTRAMUSCULAR | Status: AC
  Administered 2016-06-07: 15 mg via INTRAVENOUS
  Filled 2016-06-07: qty 1

## 2016-06-07 MED ORDER — SODIUM CHLORIDE 0.9 % IV BOLUS (SEPSIS)
2000.0000 mL | Freq: Once | INTRAVENOUS | Status: AC
  Administered 2016-06-07: 2000 mL via INTRAVENOUS

## 2016-06-07 MED ORDER — ACETAMINOPHEN 325 MG PO TABS
ORAL_TABLET | ORAL | Status: AC
  Filled 2016-06-07: qty 2

## 2016-06-07 MED ORDER — ACETAMINOPHEN 325 MG PO TABS
650.0000 mg | ORAL_TABLET | Freq: Once | ORAL | Status: AC
  Administered 2016-06-07: 650 mg via ORAL

## 2016-06-07 MED ORDER — OSELTAMIVIR PHOSPHATE 75 MG PO CAPS: 75.0000 mg | ORAL_CAPSULE | Freq: Two times a day (BID) | ORAL | 0 refills | Status: DC

## 2016-06-07 NOTE — Discharge Instructions (Signed)
Return to the emergency room for any worsening or concerning symptoms including fast breathing, heart racing, confusion, vomiting.  Rest, cover your mouth when you cough and wash your hands frequently.   Push fluids: water or Gatorade, do not drink any soda, juice or caffeinated beverages.  For fever and pain control you can take Motrin (ibuprofen) as follows: 400 mg (this is normally 2 over the counter pills) Three hours after you have had the Motrin take Tylenol (acetaminophen) as follows: You can take 650 mg (this is normally 2 over-the-counter pills) Repeat the series by taking Motrin 3 hours later, continue to do this while you are awake.  Check to see that any other over-the-counter medications don't contain acetaminophen: you shouldn't have more than 3000 mg a day.  Do not return to work until 48 hours after your fever breaks.   Please follow with your primary care doctor in the next 2 days for a check-up. They must obtain records for further management.   Do not hesitate to return to the Emergency Department for any new, worsening or concerning symptoms.

## 2016-06-07 NOTE — ED Notes (Signed)
Went to tell the patient to stay in bed and advised him that he is a fall risk and he continued to walk to the bathroom without [place] being aware.

## 2016-06-07 NOTE — ED Notes (Signed)
Pt did not need anything at this time  

## 2016-06-07 NOTE — ED Notes (Signed)
Gave pt water per RN  

## 2016-06-07 NOTE — ED Provider Notes (Signed)
[facility]   CSN: [id number] Arrival date & time: 06/07/16  0749     History   Chief Complaint Chief Complaint  Patient presents with  . Loss of Consciousness    HPI  Blood pressure 118/56, pulse 113, temperature 101.3 F (38.5 C), temperature source Oral, resp. rate 18, SpO2 96 %.  William Fitzgerald is a 31 y.o. male with PMH significant for MS and DVT (In the remote past, not anticoagulated) complaining of syncopal event when he got up to use the restroom this morning. He was walking in his room, states he felt perfectly normal and the next thing he knew he was on the floor. Incontinence. He states that he thinks the length of time he was down was brief. He has been eating less than normal, he is taking acetaminophen for muscle aches and pains, last dose was last night at 9 PM. Was seen several days ago and diagnosed with flu, he was written a prescription for Tamiflu however he didn't fill the prescription. He smokes regularly.   Past Medical History:  Diagnosis Date  . Anxiety   . Depression   . DVT (deep venous thrombosis) (HCC) 2014  . MS (multiple sclerosis) (HCC)   . MS (multiple sclerosis) (HCC)     Patient Active Problem List   Diagnosis Date Noted  . Major depressive disorder, recurrent episode (HCC) 07/06/2014  . Social anxiety disorder 07/06/2014  . Multiple sclerosis (HCC) 08/24/2013  . Nonspecific (abnormal) findings on radiological and other examination of skull and head 08/24/2013  . Disturbance of skin sensation 08/24/2013  . Other malaise and fatigue 08/24/2013  . Spasm of muscle 08/24/2013    Past Surgical History:  Procedure Laterality Date  . DENTAL SURGERY  2016  . VASCULAR SURGERY Right 06/2013       Home Medications    Prior to Admission medications   Medication Sig Start Date End Date Taking? Authorizing Provider  acetaminophen (TYLENOL) 325 MG tablet Take 650 mg by mouth every 6 (six) hours as needed for mild pain.    Yes Historical Provider, MD  Cholecalciferol (VITAMIN D) 2000 units CAPS Take 2,000 Units by mouth daily.   Yes Historical Provider, MD  oseltamivir (TAMIFLU) 75 MG capsule Take 1 capsule (75 mg total) by mouth every 12 (twelve) hours. 06/07/16   Joni Reining Keelia Graybill, PA-C    Family History Family History  Problem Relation Age of Onset  . Seizures Other   . Lung cancer Maternal Aunt   . Diabetes Maternal Grandfather   . Lung cancer Maternal Aunt   . Schizophrenia Cousin   . ADD / ADHD Cousin     Social History Social History  Substance Use Topics  . Smoking status: Former Games developer  . Smokeless tobacco: Never Used  . Alcohol use No     Allergies   Gadolinium derivatives   Review of Systems Review of Systems  10 systems reviewed and found to be negative, except as noted in the HPI.   Physical Exam Updated Vital Signs BP 120/69 (BP Location: Left Arm)   Pulse 77   Temp 99 F (37.2 C) (Oral)   Resp 17   SpO2 100%   Physical Exam  Constitutional: He appears well-developed and well-nourished.  HENT:  Head: Normocephalic.  Right Ear: External ear normal.  Left Ear: External ear normal.  Mouth/Throat: Oropharynx is clear and moist. No oropharyngeal exudate.  No drooling or stridor. Posterior pharynx mildly erythematous no significant tonsillar hypertrophy. No  exudate. Soft palate rises symmetrically. No TTP or induration under tongue.   No tenderness to palpation of frontal or bilateral maxillary sinuses.  Mild mucosal edema in the nares with scant rhinorrhea.  Bilateral tympanic membranes with normal architecture and good light reflex.    Eyes: Conjunctivae and EOM are normal. Pupils are equal, round, and reactive to light.  Neck: Normal range of motion. Neck supple.  Cardiovascular: Normal rate and regular rhythm.   Pulmonary/Chest: Effort normal and breath sounds normal. No stridor. No respiratory distress. He has no wheezes. He has no rales. He exhibits no  tenderness.  Abdominal: Soft. There is no tenderness. There is no rebound and no guarding.  Nursing note and vitals reviewed.    ED Treatments / Results  Labs (all labs ordered are listed, but only abnormal results are displayed) Labs Reviewed  BASIC METABOLIC PANEL - Abnormal; Notable for the following:       Result Value   Sodium 134 (*)    Chloride 99 (*)    Glucose, Bld 147 (*)    Calcium 8.8 (*)    All other components within normal limits  URINALYSIS, ROUTINE W REFLEX MICROSCOPIC - Abnormal; Notable for the following:    Color, Urine AMBER (*)    APPearance HAZY (*)    Ketones, ur 5 (*)    Protein, ur 30 (*)    Squamous Epithelial / LPF 0-5 (*)    All other components within normal limits  CBG MONITORING, ED - Abnormal; Notable for the following:    Glucose-Capillary 116 (*)    All other components within normal limits  CULTURE, BLOOD (ROUTINE X 2)  CULTURE, BLOOD (ROUTINE X 2)  CBC  I-STAT CG4 LACTIC ACID, ED  I-STAT CG4 LACTIC ACID, ED    EKG  EKG Interpretation  Date/Time:  Thursday June 07 2016 07:56:59 EST Ventricular Rate:  114 PR Interval:  130 QRS Duration: 84 QT Interval:  300 QTC Calculation: 413 R Axis:   72 Text Interpretation:  Sinus tachycardia Right atrial enlargement Borderline ECG No significant change was found Confirmed by CAMPOS  MD, Caryn Bee ([id number]) on 06/07/2016 9:09:04 AM       Radiology Dg Chest 2 View  Result Date: 06/07/2016 CLINICAL DATA:  Syncopal episode this morning. Cough, fever and congestion over the last 3 days. EXAM: CHEST  2 VIEW COMPARISON:  None. FINDINGS: Heart size is normal. Mediastinal shadows are normal. The lungs are clear. No bronchial thickening. No infiltrate, mass, effusion or collapse. Pulmonary vascularity is normal. No bony abnormality. IMPRESSION: Normal chest Electronically Signed   By: Paulina Fusi M.D.   On: 06/07/2016 09:46    Procedures Procedures (including critical care time)  Medications Ordered  in ED Medications  acetaminophen (TYLENOL) tablet 650 mg (650 mg Oral Given 06/07/16 0758)  sodium chloride 0.9 % bolus 2,000 mL (0 mLs Intravenous Stopped 06/07/16 1315)  ketorolac (TORADOL) 30 MG/ML injection 15 mg (15 mg Intravenous Given 06/07/16 1020)     Initial Impression / Assessment and Plan / ED Course  I have reviewed the triage vital signs and the nursing notes.  Pertinent labs & imaging results that were available during my care of the patient were reviewed by me and considered in my medical decision making (see chart for details).    Vitals:   06/07/16 1226 06/07/16 1230 06/07/16 1245 06/07/16 1246  BP:  110/55 120/69 120/69  Pulse:  92 81 77  Resp:  17 18 17   Temp: 99  F (37.2 C)     TempSrc: Oral     SpO2:  97% 100% 100%    Medications  acetaminophen (TYLENOL) tablet 650 mg (650 mg Oral Given 06/07/16 0758)  sodium chloride 0.9 % bolus 2,000 mL (0 mLs Intravenous Stopped 06/07/16 1315)  ketorolac (TORADOL) 30 MG/ML injection 15 mg (15 mg Intravenous Given 06/07/16 1020)    JAVIAN NUDD is 31 y.o. male presenting with Syncopal event while walking to the restroom this morning. He has a history of MS and DVT. Patient is febrile and tachycardic, no signs of new DVT, I think this is all secondary to dehydration from flu. He was given a prescription for Tamiflu but he didn't start the medication. Patient's tachycardia and temperature have responded well he states he feels much better after hydration, ambulating independently without issue, blood work reassuring. Chest x-ray without signs of pneumonia. I've advised him to initiate the Tamiflu and we've had an extensive discussion of return precautions, school note provided.  Evaluation does not show pathology that would require ongoing emergent intervention or inpatient treatment. Pt is hemodynamically stable and mentating appropriately. Discussed findings and plan with patient/guardian, who agrees with care plan. All questions  answered. Return precautions discussed and outpatient follow up given.     Final Clinical Impressions(s) / ED Diagnoses   Final diagnoses:  Syncope and collapse  Dehydration  Influenza-like illness    New Prescriptions Discharge Medication List as of 06/07/2016 12:49 PM       Wynetta Emery, PA-C 06/07/16 1422    Azalia Bilis, MD 06/07/16 [phone]

## 2016-06-07 NOTE — ED Triage Notes (Addendum)
Patient reports decreased appetite since Monday and when he awoke this am had syncopal event while going to bathroom, thinks he hit his head. Alert and oriented on arrival, skin warm to touch, NAD. Was seen at urgent care this past Monday for flu like symptoms

## 2016-06-07 NOTE — ED Notes (Signed)
Ambulated pt, pt's O2 stayed at 100% and Pulse stayed at 93, pt states he feels fine when he walks and does not feel dizzy.

## 2016-06-07 NOTE — ED Notes (Signed)
NAD at this time. Pt is stable and going home.  

## 2016-06-07 NOTE — ED Notes (Addendum)
PT felt fine when doing ortho sats

## 2016-06-07 NOTE — ED Notes (Signed)
PT did not have to use RR at this time  

## 2016-06-12 LAB — CULTURE, BLOOD (ROUTINE X 2)
CULTURE: NO GROWTH
CULTURE: NO GROWTH

## 2016-06-26 ENCOUNTER — Ambulatory Visit (INDEPENDENT_AMBULATORY_CARE_PROVIDER_SITE_OTHER): Payer: 59 | Admitting: Diagnostic Neuroimaging

## 2016-06-26 ENCOUNTER — Encounter: Payer: Self-pay | Admitting: Diagnostic Neuroimaging

## 2016-06-26 VITALS — BP 126/80 | HR 76 | Wt 326.0 lb

## 2016-06-26 DIAGNOSIS — M62838 Other muscle spasm: Secondary | ICD-10-CM

## 2016-06-26 DIAGNOSIS — R413 Other amnesia: Secondary | ICD-10-CM

## 2016-06-26 DIAGNOSIS — G35 Multiple sclerosis: Secondary | ICD-10-CM | POA: Diagnosis not present

## 2016-06-26 DIAGNOSIS — F329 Major depressive disorder, single episode, unspecified: Secondary | ICD-10-CM | POA: Diagnosis not present

## 2016-06-26 DIAGNOSIS — F32A Depression, unspecified: Secondary | ICD-10-CM

## 2016-06-26 NOTE — Progress Notes (Signed)
[facility]  PATIENT: William Fitzgerald DOB: 12-27-1985  REFERRING CLINICIAN:  HISTORY FROM: patient  REASON FOR VISIT: follow up   HISTORICAL  CHIEF COMPLAINT:  Chief Complaint  Patient presents with  . Multiple Sclerosis    rm 7, Ocrevus, "doing well; less muscle spasms; I have new glasses-very little change in my prescription"  . Follow-up    4 month    HISTORY OF PRESENT ILLNESS:   UPDATE 06/26/16: Since last visit, doing well. No new MS symptoms. Continues on ocrevus (next dose due this month). Had flu-like illness in Jan 2018, dehydrated, and had brief syncope. Went to urgent care and ER. Now back to baseline.   UPDATE 01/10/16: Since last visit, now on ocrevus; tolerated infusion. Muscle spasms have calmed down. Feels much better than on tecfidera. Having some mood swings and memory issues, but stable and going through therapy.   UPDATE 10/31/15: Since last visit, symptoms and MRI have progressed while on tecfidera. Also had some nausea / reaction to IV gadolinium contrast with last MRI on 09/13/15. Here to discuss switching tecfidera to ocrevus. Depression stable. Has been hiking up [place] with his family (cousins). 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   UPDATE 08/16/15: Since last visit, has continued on tecfidera. Lost to follow up. Went to behavioral health for suicidal thoughts. Now doing much better from mood standpoint. Still with fatigue, vision changes, right sided spasms. Now in culinary school, and may be relocating to [place].   UPDATE 04/27/14: Since last visit, now on tecfidera for past 3 weeks, no side effects. Wants to return to work. Overall vision, strength, numbness are improving.  UPDATE 03/10/14: Since last visit, we tried to start gilenya, then it was denied by insurance, then patient was reluctant to start. He opted to take vitamin D only and not other MS meds. 2 weeks ago, developed new onset of fatigue,  bilateral blurred vision, right arm weakness, right leg numbness and balance difficulty.   UPDATE 09/18/13: Since last visit, no new neuro symptoms. Some fluctuation of right arm/leg numbness and weakness. Testing results reviewed.  PRIOR HPI (08/24/13): 31 year old right-handed male here for evaluation of possible multiple sclerosis. 2010 patient had intermittent right leg stiffness and weakness. He was diagnosed with "blood clot" in his right leg, not treated with anticoagulation. He recently had laser vein removal on this leg. 2.5 weeks ago patient noted right arm and right leg weakness and numbness. He's also having dizziness when he gets out of bed. He's having more balance difficulty. Patient symptoms have gradually worsened. Patient went to the emergency room for evaluation, had MRI of the brain which showed multiple supratentorial and infratentorial white matter lesions, suspicious for multiple sclerosis. Patient referred to me for further evaluation. No family history of multiple sclerosis. No episodes of unilateral visual loss. No slurred speech or trouble talking. No bowel or bladder incontinence.   REVIEW OF SYSTEMS: Full 14 system review of systems performed and negative with exception of: depression anxiety aching muscles memory loss headaches.   ALLERGIES: Allergies  Allergen Reactions  . Gadolinium Derivatives Nausea Only    Pt became nauseous after contrast    HOME MEDICATIONS: Outpatient Medications Prior to Visit  Medication Sig Dispense Refill  . acetaminophen (TYLENOL) 325 MG tablet Take 650 mg by mouth every 6 (six) hours as needed for mild pain.    . Cholecalciferol (VITAMIN D) 2000 units CAPS Take 2,000 Units by mouth daily.    Marland Kitchen  oseltamivir (TAMIFLU) 75 MG capsule Take 1 capsule (75 mg total) by mouth every 12 (twelve) hours. 10 capsule 0   No facility-administered medications prior to visit.     PAST MEDICAL HISTORY: Past Medical History:  Diagnosis Date  .  Anxiety   . Depression   . DVT (deep venous thrombosis) ([place]) 2014  . MS (multiple sclerosis) ([place])   . MS (multiple sclerosis) ([place])     PAST SURGICAL HISTORY: Past Surgical History:  Procedure Laterality Date  . DENTAL SURGERY  2016  . VASCULAR SURGERY Right 06/2013    FAMILY HISTORY: Family History  Problem Relation Age of Onset  . Seizures Other   . Lung cancer Maternal Aunt   . Diabetes Maternal Grandfather   . Lung cancer Maternal Aunt   . Schizophrenia Cousin   . ADD / ADHD Cousin     SOCIAL HISTORY:  Social History   Social History  . Marital status: Single    Spouse name: N/A  . Number of children: 0  . Years of education: 12th   Occupational History  .  [employer]   Social History Main Topics  . Smoking status: Former Research scientist (life sciences)  . Smokeless tobacco: Never Used  . Alcohol use No  . Drug use: No     Comment: quit marijuana 2014  . Sexual activity: Yes    Birth control/ protection: Condom   Other Topics Concern  . Not on file   Social History Narrative   Patient lives at home with family.   Caffeine Use: tea occasionally     PHYSICAL EXAM  GENERAL EXAM/CONSTITUTIONAL: Vitals:  Vitals:   06/26/16 0803  BP: 126/80  Pulse: 76  Weight: (!) 326 lb (147.9 kg)   Wt Readings from Last 3 Encounters:  06/26/16 (!) 326 lb (147.9 kg)  01/10/16 299 lb 6.4 oz (135.8 kg)  12/07/15 298 lb 9.6 oz (135.4 kg)    Body mass index is 37.67 kg/m. No exam data present  Patient is in no distress; well developed, nourished and groomed; neck is supple  CARDIOVASCULAR:  Examination of carotid arteries is normal; no carotid bruits  Regular rate and rhythm, no murmurs  Examination of peripheral vascular system by observation and palpation is normal  EYES:  Ophthalmoscopic exam of optic discs and posterior segments is normal; no papilledema or hemorrhages  MUSCULOSKELETAL:  Gait, strength, tone, movements noted in Neurologic exam  below  NEUROLOGIC: MENTAL STATUS:  No flowsheet data found.  awake, alert, oriented to person, place and time  recent and remote memory intact  normal attention and concentration  language fluent, comprehension intact, naming intact,   fund of knowledge appropriate  CRANIAL NERVE:   2nd - no papilledema on fundoscopic exam  2nd, 3rd, 4th, 6th - pupils equal and reactive to light, visual fields full to confrontation, extraocular muscles intact, no nystagmus  5th - facial sensation symmetric  7th - facial strength symmetric  8th - hearing intact  9th - palate elevates symmetrically, uvula midline  11th - shoulder shrug symmetric  12th - tongue protrusion midline  MOTOR:   normal bulk and tone, full strength in the BUE, BLE  SENSORY:   normal and symmetric to light touch, temperature, vibration  COORDINATION:   finger-nose-finger, fine finger movements SLIGHTLY SLOW IN LEFT HAND  REFLEXES:   deep tendon reflexes TRACE and symmetric  GAIT/STATION:   narrow based gait; DECREASED ARM SWING     DIAGNOSTIC  DATA (LABS, IMAGING, TESTING) - I reviewed patient records, labs, notes, testing and imaging myself where available.  Lab Results  Component Value Date   WBC 7.8 06/07/2016   HGB 15.3 06/07/2016   HCT 45.8 06/07/2016   MCV 80.4 06/07/2016   PLT 198 06/07/2016      Component Value Date/Time   NA 134 (L) 06/07/2016 0823   NA 140 10/31/2015 1027   K 3.5 06/07/2016 0823   CL 99 (L) 06/07/2016 0823   CO2 23 06/07/2016 0823   GLUCOSE 147 (H) 06/07/2016 0823   BUN 8 06/07/2016 0823   BUN 12 10/31/2015 1027   CREATININE 0.92 06/07/2016 0823   CALCIUM 8.8 (L) 06/07/2016 0823   PROT 6.9 12/07/2015 1056   PROT 7.0 10/31/2015 1027   ALBUMIN 4.1 12/07/2015 1056   ALBUMIN 4.5 10/31/2015 1027   AST 21 12/07/2015 1056   ALT 16 (L) 12/07/2015 1056   ALKPHOS 49 12/07/2015 1056   BILITOT 2.7 (H) 12/07/2015 1056   BILITOT 1.6 (H) 10/31/2015 1027    GFRNONAA >60 06/07/2016 0823   GFRAA >60 06/07/2016 0823   Vit D, 25-Hydroxy  Date Value Ref Range Status  10/31/2015 29.9 (L) 30.0 - 100.0 ng/mL Final    Comment:    Vitamin D deficiency has been defined by the [place] practice guideline as a level of serum 25-OH vitamin D less than 20 ng/mL (1,2). The Endocrine Society went on to further define vitamin D insufficiency as a level between 21 and 29 ng/mL (2). 1. IOM (Institute of Medicine). 2010. Dietary reference    intakes for calcium and D. [place]: The    [employer]. 2. Holick MF, Binkley Gully, Bischoff-Ferrari HA, et al.    Evaluation, treatment, and prevention of vitamin D    deficiency: an Endocrine Society clinical practice    guideline. JCEM. 2011 Jul; 96(7):1911-30.    No results found for: CHOL, HDL, LDLCALC, LDLDIRECT, TRIG, CHOLHDL No results found for: HGBA1C No results found for: VITAMINB12 No results found for: TSH   08/13/13 MRI brain (without) - Findings consistent with widespread supratentorial fulminant acute multiple sclerosis. No dominant lesion to suggest tumefactive MS.   09/04/13 MRI brain (with and without) - Multiple acute and chronic, supratentorial and infratentorial chronic demyelinating plaques. No significant change from MRI on 08/13/13, although prior study did not include post-contrast views.  09/04/13 MRI cervical spine (with and without) - normal  09/04/13 MRI thoracic spine (with and without) - normal  03/16/14 MRI brain (with and without) - Highly abnormal MRI scan of the brain showing multiple supratentorial (periventricular, subcortical, juxtacortical, thalamic, basal ganglia) and infratentorial (midbrain, pontine, cerebellar) T2 hyperintensities, consistent with both active and chronic demyelinating plaques. Multiple enhancing lesions are noted. Compared with previous MRI dated 09/04/2013 there appears to be increase in overall acute and chronic  disease activity with a new large right posterior frontal and enlarged right cerebellar cystic ring-enhancing lesion noted.  09/13/15 MRI brain (with and without) [I reviewed images myself and agree with interpretation. -VRP]  1. Multiple cerebellar, brain stem, deep gray matter and hemispheric white matter lesions in a pattern and configuration consistent with demyelinating plaques associated with multiple sclerosis. 5 of the foci enhance after contrast administration consistent with more acute foci. 2. When compared to the MRI from 09/04/2013, there are many more deep and periventricular foci, most of which do not enhance. The multiple enhancing lesions on the older MRI no longer  enhance and they appear smaller on the current scan.  08/27/13 Visual Evoked Potentials - normal  08/24/13 Labs: ANA. ANCA. ESR, CRP, NMO, HIV, Hep B, Hep C, ACE, RPR, Lyme Ab - all negative    ASSESSMENT AND PLAN  31 y.o. year old male here with multiple sclerosis, with symptoms dating back to 2010. Confirmatory and rule out testing completed. Discussed diagnosis, prognosis and treatment options. Given clinical consideration, patient involvement, and JCV positive status, we decided on TECFIDERA (started Dec 2015). Since then was doing well until 2017, now more left sided symptoms and also progression of MRI scan. Now tecfidera switched to ocrevus in August 2017 and doing well.   Multiple sclerosis, Muscles spasms and depression stable.   Dx:   1. MS (multiple sclerosis) ([place])   2. Memory loss   3. Depression, unspecified depression type   4. Muscle spasm      PLAN:  - continue ocrevus (every 6 months) - continue vitamin D - continue monitoring depression (doing well with conservative mgmt; meditation, exercise, nutrition) - repeat MRI brain in August 2018  Return in about 6 months (around 12/24/2016).    Penni Bombard, MD 8/54/6270, 3:50 AM Certified in Neurology, Neurophysiology and  Neuroimaging  [facility] [address] [place], [place] [id number] [phone]

## 2016-07-03 ENCOUNTER — Encounter (HOSPITAL_COMMUNITY): Payer: Self-pay | Admitting: Emergency Medicine

## 2016-07-03 ENCOUNTER — Ambulatory Visit (HOSPITAL_COMMUNITY)
Admission: EM | Admit: 2016-07-03 | Discharge: 2016-07-03 | Disposition: A | Discharge status: Home / Self Care | Payer: 59 | Attending: Family Medicine | Admitting: Family Medicine

## 2016-07-03 DIAGNOSIS — S61214A Laceration without foreign body of right ring finger without damage to nail, initial encounter: Secondary | ICD-10-CM

## 2016-07-03 DIAGNOSIS — Z23 Encounter for immunization: Secondary | ICD-10-CM

## 2016-07-03 MED ORDER — TETANUS-DIPHTH-ACELL PERTUSSIS 5-2.5-18.5 LF-MCG/0.5 IM SUSP
INTRAMUSCULAR | Status: AC
  Filled 2016-07-03: qty 0.5

## 2016-07-03 MED ORDER — TETANUS-DIPHTH-ACELL PERTUSSIS 5-2.5-18.5 LF-MCG/0.5 IM SUSP
0.5000 mL | Freq: Once | INTRAMUSCULAR | Status: AC
  Administered 2016-07-03: 0.5 mL via INTRAMUSCULAR

## 2016-07-03 NOTE — ED Provider Notes (Signed)
Pt cut his finger with a knife last night.  Pt states it won't stop bleeding. Pt will need a TDAP injection. [facility]    CSN: [id number] Arrival date & time: 07/03/16  1038     History   Chief Complaint Chief Complaint  Patient presents with  . Laceration    HPI William Fitzgerald is a 31 y.o. male.   Pt cut his finger with a knife last night.  Pt states it won't stop bleeding. Pt will need a TDAP injection.      Past Medical History:  Diagnosis Date  . Anxiety   . Depression   . DVT (deep venous thrombosis) ([facility]) 2014  . MS (multiple sclerosis) ([facility])   . MS (multiple sclerosis) ([facility])     Patient Active Problem List   Diagnosis Date Noted  . Major depressive disorder, recurrent episode ([facility]) 07/06/2014  . Social anxiety disorder 07/06/2014  . Multiple sclerosis ([facility]) 08/24/2013  . Nonspecific (abnormal) findings on radiological and other examination of skull and head 08/24/2013  . Disturbance of skin sensation 08/24/2013  . Other malaise and fatigue 08/24/2013  . Spasm of muscle 08/24/2013    Past Surgical History:  Procedure Laterality Date  . DENTAL SURGERY  2016  . VASCULAR SURGERY Right 06/2013       Home Medications    Prior to Admission medications   Medication Sig Start Date End Date Taking? Authorizing Provider  Cholecalciferol (VITAMIN D) 2000 units CAPS Take 2,000 Units by mouth daily.   Yes Historical Provider, MD  acetaminophen (TYLENOL) 325 MG tablet Take 650 mg by mouth every 6 (six) hours as needed for mild pain.    Historical Provider, MD    Family History Family History  Problem Relation Age of Onset  . Seizures Other   . Lung cancer Maternal Aunt   . Diabetes Maternal Grandfather   . Lung cancer Maternal Aunt   . Schizophrenia Cousin   . ADD / ADHD Cousin     Social History Social History  Substance Use Topics  . Smoking status: Former Games developer  . Smokeless tobacco: Never Used  . Alcohol use No     Allergies     Gadolinium derivatives   Review of Systems Review of Systems  Constitutional: Negative.   Musculoskeletal: Negative.   Skin: Positive for wound.  Neurological: Negative.      Physical Exam Triage Vital Signs ED Triage Vitals [07/03/16 1123]  Enc Vitals Group     BP 122/72     Pulse Rate 80     Resp      Temp 98.7 F (37.1 C)     Temp Source Oral     SpO2 100 %     Weight      Height      Head Circumference      Peak Flow      Pain Score 0     Pain Loc      Pain Edu?      Excl. in GC?    No data found.   Updated Vital Signs BP 122/72 (BP Location: Left Arm)   Pulse 80   Temp 98.7 F (37.1 C) (Oral)   SpO2 100%   Visual Acuity Right Eye Distance:   Left Eye Distance:   Bilateral Distance:    Right Eye Near:   Left Eye Near:    Bilateral Near:     Physical Exam  Constitutional: He is oriented to person, place,  and time. He appears well-developed and well-nourished.  HENT:  Head: Normocephalic.  Eyes: Conjunctivae are normal. Pupils are equal, round, and reactive to light.  Neck: Normal range of motion. Neck supple.  Pulmonary/Chest: Effort normal.  Neurological: He is alert and oriented to person, place, and time.  Skin: Skin is warm and dry.  Nursing note and vitals reviewed.    UC Treatments / Results  Labs (all labs ordered are listed, but only abnormal results are displayed) Labs Reviewed - No data to display  EKG  EKG Interpretation None       Radiology No results found.  Procedures .Marland KitchenLaceration Repair Date/Time: 07/03/2016 11:42 AM Performed by: Elvina Sidle Authorized by: Elvina Sidle   Consent:    Consent obtained:  Verbal   Consent given by:  Patient   Risks discussed:  Infection and need for additional repair   Alternatives discussed:  Delayed treatment Anesthesia (see MAR for exact dosages):    Anesthesia method:  None Laceration details:    Location:  Finger   Finger location:  L ring finger Repair type:     Repair type:  Simple Exploration:    Hemostasis achieved with:  Direct pressure   Wound extent: no foreign bodies/material noted and no muscle damage noted     Contaminated: no   Treatment:    Area cleansed with:  Betadine   Amount of cleaning:  Standard   Irrigation solution:  Tap water   Visualized foreign bodies/material removed: no   Skin repair:    Repair method:  Tissue adhesive Approximation:    Approximation:  Loose   Vermilion border: poorly aligned   Post-procedure details:    Dressing:  Bulky dressing   Patient tolerance of procedure:  Tolerated well, no immediate complications   (including critical care time)  Medications Ordered in UC Medications - No data to display   Initial Impression / Assessment and Plan / UC Course  I have reviewed the triage vital signs and the nursing notes.  Pertinent labs & imaging results that were available during my care of the patient were reviewed by me and considered in my medical decision making (see chart for details).      Final Clinical Impressions(s) / UC Diagnoses   Final diagnoses:  Laceration of right ring finger without foreign body without damage to nail, initial encounter    New Prescriptions New Prescriptions   No medications on file     Elvina Sidle, MD 07/03/16 1145

## 2016-07-03 NOTE — ED Triage Notes (Addendum)
Pt cut his finger with a knife last night.  Pt states it won't stop bleeding. Pt will need a TDAP injection.

## 2016-07-03 NOTE — Discharge Instructions (Signed)
Please return for any increasing redness or pain. Expect about 2 weeks for complete healing. The Dermabond glue will start to flake off in 4-5 days by which time there should be enough news skin so that you do not need a second coating.

## 2016-07-28 ENCOUNTER — Ambulatory Visit (HOSPITAL_COMMUNITY)
Admission: EM | Admit: 2016-07-28 | Discharge: 2016-07-28 | Disposition: A | Discharge status: Home / Self Care | Payer: 59 | Attending: Radiology | Admitting: Radiology

## 2016-07-28 ENCOUNTER — Encounter (HOSPITAL_COMMUNITY): Payer: Self-pay | Admitting: Emergency Medicine

## 2016-07-28 DIAGNOSIS — M79644 Pain in right finger(s): Secondary | ICD-10-CM | POA: Diagnosis not present

## 2016-07-28 MED ORDER — CEPHALEXIN 500 MG PO CAPS: 500.0000 mg | ORAL_CAPSULE | Freq: Four times a day (QID) | ORAL | 0 refills | Status: DC

## 2016-07-28 MED ORDER — HYDROCORTISONE 2.5 % EX LOTN: TOPICAL_LOTION | Freq: Two times a day (BID) | CUTANEOUS | 0 refills | Status: DC

## 2016-07-28 MED ORDER — IBUPROFEN 600 MG PO TABS: 600.0000 mg | ORAL_TABLET | Freq: Four times a day (QID) | ORAL | 0 refills | Status: DC | PRN

## 2016-07-28 NOTE — ED Triage Notes (Signed)
Right middle finger swelling onset last night.  No known injury.  Right middle finger is slightly sore

## 2016-07-28 NOTE — ED Provider Notes (Signed)
CSN: [id number]     Arrival date & time 07/28/16  1743 History   None    Chief Complaint  Patient presents with  . Hand Problem   (Consider location/radiation/quality/duration/timing/severity/associated sxs/prior Treatment) 31 y.o. male presents with erythema and swelling to proximal metatarsal middle finger of right hand. Patient states that he does not know what happened and cannot say if he injured or himself or was bit by an insect. Erythremic area is approximately 1.5 cm on the posterior aspect of right middle finder. Patient endorses itching and pain. Patient has full rom of affecting finger. Cap refil < 2,   X. Condition is acute in nature. Condition is made better by nothing. Condition is made worse by movemet. Patient denies any treatments prior to there arrival at this facility.        Past Medical History:  Diagnosis Date  . Anxiety   . Depression   . DVT (deep venous thrombosis) ([facility]) 2014  . MS (multiple sclerosis) ([facility])   . MS (multiple sclerosis) ([facility])    Past Surgical History:  Procedure Laterality Date  . DENTAL SURGERY  2016  . VASCULAR SURGERY Right 06/2013   Family History  Problem Relation Age of Onset  . Seizures Other   . Lung cancer Maternal Aunt   . Diabetes Maternal Grandfather   . Lung cancer Maternal Aunt   . Schizophrenia Cousin   . ADD / ADHD Cousin    Social History  Substance Use Topics  . Smoking status: Former Games developer  . Smokeless tobacco: Never Used  . Alcohol use No    Review of Systems  Constitutional: Negative for chills and fever.  HENT: Negative for ear pain and sore throat.   Eyes: Negative for pain and visual disturbance.  Respiratory: Negative for cough and shortness of breath.   Cardiovascular: Negative for chest pain and palpitations.  Gastrointestinal: Negative for abdominal pain and vomiting.  Genitourinary: Negative for dysuria and hematuria.  Musculoskeletal: Negative for arthralgias and back pain.       Pain to  middle finger on right hand  Skin: Positive for rash ( erythema to right hand middle finger). Negative for color change.  Neurological: Negative for seizures and syncope.  All other systems reviewed and are negative.   Allergies  Gadolinium derivatives  Home Medications   Prior to Admission medications   Medication Sig Start Date End Date Taking? Authorizing Provider  acetaminophen (TYLENOL) 325 MG tablet Take 650 mg by mouth every 6 (six) hours as needed for mild pain.    Historical Provider, MD  cephALEXin (KEFLEX) 500 MG capsule Take 1 capsule (500 mg total) by mouth 4 (four) times daily. 07/28/16   Alene Mires, NP  Cholecalciferol (VITAMIN D) 2000 units CAPS Take 2,000 Units by mouth daily.    Historical Provider, MD  hydrocortisone 2.5 % lotion Apply topically 2 (two) times daily. 07/28/16   Alene Mires, NP  ibuprofen (ADVIL,MOTRIN) 600 MG tablet Take 1 tablet (600 mg total) by mouth every 6 (six) hours as needed. 07/28/16   Alene Mires, NP   Meds Ordered and Administered this Visit  Medications - No data to display  BP 121/63 (BP Location: Left Arm) Comment (BP Location): large cuff  Pulse 82   Temp 98.6 F (37 C) (Oral)   Resp (!) 24   SpO2 100%  No data found.   Physical Exam  Constitutional: He appears well-developed and well-nourished.  HENT:  Head: Normocephalic and atraumatic.  Eyes: Conjunctivae are normal.  Neck: Neck supple.  Cardiovascular: Normal rate and regular rhythm.   No murmur heard. Pulmonary/Chest: Effort normal and breath sounds normal. No respiratory distress.  Abdominal: Soft. There is no tenderness.  Musculoskeletal: He exhibits no edema.  Neurological: He is alert.  Skin: Skin is warm and dry. Capillary refill takes less than 2 seconds. There is erythema ( approx 1 cm to proximak aspect of middlle finger on right hand. ).   Swelling noted to DIP.   Psychiatric: He has a normal mood and affect.  Nursing note and vitals  reviewed.   Urgent Care Course     Procedures (including critical care time)  Labs Review Labs Reviewed - No data to display  Imaging Review No results found.     MDM   1. Finger pain, right       Alene Mires, NP 07/28/16 [phone]

## 2016-10-02 ENCOUNTER — Encounter (HOSPITAL_COMMUNITY): Payer: Self-pay | Admitting: Psychology

## 2016-10-02 NOTE — Progress Notes (Unsigned)
William Fitzgerald is a 31 y.o. male patient who is discharged from counseling as last seen on 01/25/16 .  Outpatient Therapist Discharge Summary  PACO FALKOWITZ    [birth year]   Admission Date: 8.10/17   Discharge Date:  10/02/16 Reason for Discharge:  Not active Diagnosis:  Social Anxiety  Comments:  Pt may return as needed in future.  Alfredo Batty, LPC

## 2016-10-30 ENCOUNTER — Encounter (INDEPENDENT_AMBULATORY_CARE_PROVIDER_SITE_OTHER): Payer: Self-pay

## 2016-10-30 ENCOUNTER — Ambulatory Visit (INDEPENDENT_AMBULATORY_CARE_PROVIDER_SITE_OTHER): Payer: 59 | Admitting: Licensed Clinical Social Worker

## 2016-10-30 DIAGNOSIS — F331 Major depressive disorder, recurrent, moderate: Secondary | ICD-10-CM

## 2016-10-30 DIAGNOSIS — F4010 Social phobia, unspecified: Secondary | ICD-10-CM

## 2016-10-31 ENCOUNTER — Encounter (HOSPITAL_COMMUNITY): Payer: Self-pay | Admitting: Licensed Clinical Social Worker

## 2016-10-31 NOTE — Progress Notes (Signed)
INITIAL SESSION  THERAPIST PROGRESS NOTE  Session Time: 10-11:15  Participation Level: Active  Behavioral Response: CasualAlertDysphoric and Euthymic  Type of Therapy: Individual Therapy  Treatment Goals addressed: Diagnosis: MDD, GAD  Interventions: CBT and Motivational Interviewing  Summary: William Fitzgerald is a 31 y.o. male who presents with depressed mood, anxiety, and significant worrying about what other's think of his presentation. He states he is seeking help with controlling his anger, getting less agitated by "little things like bad drivers", and communicating better with his coworkers. He presents as somewhat guarded, detached from session and appears to struggle to convey his precise symptomology. He admits he smokes weed "nearly every day". He reports recently intensifying HI/SI which he finds "uncomfortable, but he knows he wouldn't do anything". He reports having dreams about stabbing coworkers who annoy him. He denies a hx of violent behavior but admits, "I was probably perceived as the 'bully' in high school'. Pt states he is not interested in medication bc he "doesn't want to put chemicals in his body". Pt and counselors discussed pt goals of lowered anger and improved sleep.  Suicidal/Homicidal: Yeswithout intent/plan  Therapist Response: Pt has seen another therapist in this office and a psychiatrists for medications. He admits his attendance report is "spotty" and he tends to enter therapy when he feels things are getting worse, "like now". Counselor used MI to evoke pt-led change talk and establish therapeutic direction. Pt got short with counselor when counselor asked questions about pt's long-time GF of "a few years". Pt quickly responded "lets not talk about her". Counselor used open questions and validation to build rapport. Pt was asked if he needed anything else from counselor at the end of session and pt said "i need a hug". Pt then stood up and opened his arms wide.  Counselor moved towards him to embrace him after checking if he "was sure he wanted that" and pt quickly closed his body posture and said "nah, i'm kidding". Pt admitted he is misunderstood in his interpersonal interactions and this was confirmed in his presentation in this meeting.   Plan: Return again in 2 weeks.  Diagnosis:    ICD-10-CM   1. Social anxiety disorder F40.10   2. Major depressive disorder, recurrent episode, moderate (HCC) F33.1       Margo Common, LPCA 10/31/2016

## 2016-11-05 ENCOUNTER — Encounter: Payer: Self-pay | Admitting: *Deleted

## 2016-11-05 ENCOUNTER — Telehealth: Payer: Self-pay | Admitting: Diagnostic Neuroimaging

## 2016-11-05 NOTE — Telephone Encounter (Signed)
Needs note stating he has been diagnosed with MS and that we follow him for this. To whom this may concern.  Note made for pt, and Dr. Marjory Lies to sign.

## 2016-11-05 NOTE — Telephone Encounter (Signed)
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  Pt said he would need this by tomorrow.  Pt advised the request would be submitted but that he will have to allow sufficient time for such a request to be processed.  Please call pt re: this request.

## 2016-11-05 NOTE — Telephone Encounter (Signed)
Note signed and placed upfront to pt to pick up.  LMVM for him on home # that it was ready.

## 2016-11-13 ENCOUNTER — Ambulatory Visit (INDEPENDENT_AMBULATORY_CARE_PROVIDER_SITE_OTHER): Payer: 59 | Admitting: Licensed Clinical Social Worker

## 2016-11-13 DIAGNOSIS — F411 Generalized anxiety disorder: Secondary | ICD-10-CM | POA: Diagnosis not present

## 2016-11-13 DIAGNOSIS — F331 Major depressive disorder, recurrent, moderate: Secondary | ICD-10-CM | POA: Diagnosis not present

## 2016-11-14 ENCOUNTER — Encounter (HOSPITAL_COMMUNITY): Payer: Self-pay | Admitting: Licensed Clinical Social Worker

## 2016-11-14 NOTE — Progress Notes (Signed)
   THERAPIST PROGRESS NOTE  Session Time: 10-11AM  Participation Level: Active  Behavioral Response: CasualAlertEuthymic and Irritable  Type of Therapy: Individual Therapy  Treatment Goals addressed: Diagnosis: MDD, GAD  Interventions: CBT  Summary: William Fitzgerald is a 31 y.o. male who presents seeking help with frustration, irritability, and depressed mood due to MDD and GAD. Pt is engaged, relatively loud, and appears confused by his mood. He reports on his recent week. He continues to deny need for any psychopharmacology. He continues to endorse marijuana use near daily and states it "is not an issue". 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 Pt discusses performance anxieties. Pt discusses his frustrations with coworkers who do not "clean up their mess" in the kitchen. Pt and counselor discuss pt's deeper frustrations w/ himself. Pt states he "wishes he could be more social" and expresses disappointment in himself for "ignoring people when they say hello to me". Pt states he asked his gf "for some space" and he feels annoyed when she tries to reach out to him.  Suicidal/Homicidal: Nowithout intent/plan  Therapist Response: Pt appears frustrated w/ himself in session and his inability to define his specific problem. He admits he is agitated often, cares deeply about what others think of him, and always feels that people are judging him. Counselor worked to focus pt on his own experience of his frustration, redirecting pt when pt would talk about an external factor that makes him mad. Pt frequently dismissed his own frustrations, stating, "people are just people, and if they don't like me or my cooking, fuck 'em". Counselor responded that pt appears worried which pt agreed to tentatively. 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 Pt appears to lack self worth and looks for  outside validation from others. Counselor worked to identify pt's source of anger which appears to center on pt's inability to present the way "he thinks he should with others". Counselor attempted to increase pt's insight into underlying fear and negative core beliefs.  Plan: Return again in 1 weeks.  Diagnosis:    ICD-10-CM   1. Major depressive disorder, recurrent episode, moderate (HCC) F33.1   2. Generalized anxiety disorder F41.1        Margo Common, LPCA 11/14/2016

## 2016-11-20 ENCOUNTER — Encounter (HOSPITAL_COMMUNITY): Payer: Self-pay | Admitting: Licensed Clinical Social Worker

## 2016-11-20 ENCOUNTER — Ambulatory Visit (INDEPENDENT_AMBULATORY_CARE_PROVIDER_SITE_OTHER): Payer: 59 | Admitting: Licensed Clinical Social Worker

## 2016-11-20 DIAGNOSIS — F331 Major depressive disorder, recurrent, moderate: Secondary | ICD-10-CM

## 2016-11-20 DIAGNOSIS — F411 Generalized anxiety disorder: Secondary | ICD-10-CM | POA: Diagnosis not present
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
## 2016-11-24 ENCOUNTER — Emergency Department (HOSPITAL_COMMUNITY): Payer: 59

## 2016-11-24 ENCOUNTER — Emergency Department (HOSPITAL_COMMUNITY)
Admission: EM | Admit: 2016-11-24 | Discharge: 2016-11-24 | Disposition: A | Discharge status: Home / Self Care | Payer: 59 | Attending: Emergency Medicine | Admitting: Emergency Medicine

## 2016-11-24 ENCOUNTER — Encounter (HOSPITAL_COMMUNITY): Payer: Self-pay | Admitting: *Deleted

## 2016-11-24 DIAGNOSIS — M25562 Pain in left knee: Secondary | ICD-10-CM | POA: Diagnosis not present

## 2016-11-24 DIAGNOSIS — R0789 Other chest pain: Secondary | ICD-10-CM | POA: Diagnosis not present

## 2016-11-24 DIAGNOSIS — Y939 Activity, unspecified: Secondary | ICD-10-CM | POA: Insufficient documentation

## 2016-11-24 DIAGNOSIS — Y92410 Unspecified street and highway as the place of occurrence of the external cause: Secondary | ICD-10-CM | POA: Insufficient documentation

## 2016-11-24 DIAGNOSIS — Z87891 Personal history of nicotine dependence: Secondary | ICD-10-CM | POA: Diagnosis not present

## 2016-11-24 DIAGNOSIS — M25512 Pain in left shoulder: Secondary | ICD-10-CM | POA: Diagnosis not present

## 2016-11-24 DIAGNOSIS — M542 Cervicalgia: Secondary | ICD-10-CM | POA: Diagnosis not present

## 2016-11-24 DIAGNOSIS — Z79899 Other long term (current) drug therapy: Secondary | ICD-10-CM | POA: Insufficient documentation

## 2016-11-24 DIAGNOSIS — Y999 Unspecified external cause status: Secondary | ICD-10-CM | POA: Diagnosis not present

## 2016-11-24 MED ORDER — METHOCARBAMOL 500 MG PO TABS: 500.0000 mg | ORAL_TABLET | Freq: Two times a day (BID) | ORAL | 0 refills | Status: DC

## 2016-11-24 MED ORDER — METHOCARBAMOL 500 MG PO TABS
750.0000 mg | ORAL_TABLET | Freq: Once | ORAL | Status: AC
  Administered 2016-11-24: 750 mg via ORAL
  Filled 2016-11-24: qty 2

## 2016-11-24 MED ORDER — IBUPROFEN 600 MG PO TABS: 600.0000 mg | ORAL_TABLET | Freq: Four times a day (QID) | ORAL | 0 refills | Status: DC | PRN

## 2016-11-24 MED ORDER — IBUPROFEN 400 MG PO TABS
600.0000 mg | ORAL_TABLET | Freq: Once | ORAL | Status: AC
  Administered 2016-11-24: 20:00:00 600 mg via ORAL
  Filled 2016-11-24: qty 1

## 2016-11-24 NOTE — Discharge Instructions (Signed)
Take your medications as prescribed. I also recommend applying ice and/or heat to affected area for 15-20 minutes 3-4 times daily for additional pain relief. Refrain from doing any heavy lifting, squatting or repetitive movements that exacerbate your symptoms. Follow-up with your primary care provider in the next week if her symptoms have not improved.  Please return to the Emergency Department if symptoms worsen or new onset of fever, numbness, tingling, groin anesthesia, loss of bowel or bladder, weakness, headache, chest pain, difficulty breathing, abdominal pain, vomiting.
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
## 2016-11-29 ENCOUNTER — Ambulatory Visit (HOSPITAL_COMMUNITY)
Admission: EM | Admit: 2016-11-29 | Discharge: 2016-11-29 | Disposition: A | Discharge status: Home / Self Care | Payer: 59 | Attending: Nurse Practitioner | Admitting: Nurse Practitioner

## 2016-11-29 ENCOUNTER — Encounter (HOSPITAL_COMMUNITY): Payer: Self-pay

## 2016-11-29 DIAGNOSIS — S161XXD Strain of muscle, fascia and tendon at neck level, subsequent encounter: Secondary | ICD-10-CM | POA: Diagnosis not present

## 2016-11-29 MED ORDER — CYCLOBENZAPRINE HCL 10 MG PO TABS: 10.0000 mg | ORAL_TABLET | Freq: Three times a day (TID) | ORAL | 0 refills | Status: DC | PRN

## 2016-11-29 NOTE — Discharge Instructions (Signed)
Stop the methocarbamol (robaxin) and start flexeril as needed for muscle spasms. Continue ibuprofen. Use moist heat to neck and shoulder to help with muscle stiffness.
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
## 2016-12-11 ENCOUNTER — Encounter (HOSPITAL_COMMUNITY): Payer: Self-pay | Admitting: Licensed Clinical Social Worker

## 2016-12-11 ENCOUNTER — Ambulatory Visit (INDEPENDENT_AMBULATORY_CARE_PROVIDER_SITE_OTHER): Payer: 59 | Admitting: Licensed Clinical Social Worker

## 2016-12-11 DIAGNOSIS — F331 Major depressive disorder, recurrent, moderate: Secondary | ICD-10-CM

## 2016-12-11 DIAGNOSIS — F411 Generalized anxiety disorder: Secondary | ICD-10-CM | POA: Diagnosis not present
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
## 2016-12-17 ENCOUNTER — Ambulatory Visit (INDEPENDENT_AMBULATORY_CARE_PROVIDER_SITE_OTHER): Payer: 59 | Admitting: Physician Assistant

## 2016-12-17 ENCOUNTER — Encounter (INDEPENDENT_AMBULATORY_CARE_PROVIDER_SITE_OTHER): Payer: Self-pay | Admitting: Physician Assistant

## 2016-12-17 VITALS — BP 121/84 | HR 72 | Temp 97.7°F | Wt 360.2 lb

## 2016-12-17 DIAGNOSIS — M546 Pain in thoracic spine: Secondary | ICD-10-CM | POA: Diagnosis not present

## 2016-12-17 DIAGNOSIS — F32A Depression, unspecified: Secondary | ICD-10-CM

## 2016-12-17 DIAGNOSIS — G8929 Other chronic pain: Secondary | ICD-10-CM

## 2016-12-17 DIAGNOSIS — F419 Anxiety disorder, unspecified: Secondary | ICD-10-CM

## 2016-12-17 DIAGNOSIS — F329 Major depressive disorder, single episode, unspecified: Secondary | ICD-10-CM

## 2016-12-17 MED ORDER — ACETAMINOPHEN 500 MG PO TABS: 1000.0000 mg | ORAL_TABLET | Freq: Three times a day (TID) | ORAL | 0 refills | Status: AC | PRN

## 2016-12-17 MED ORDER — MELOXICAM 15 MG PO TABS: 15.0000 mg | ORAL_TABLET | Freq: Every day | ORAL | 0 refills | Status: DC

## 2016-12-17 MED ORDER — ESCITALOPRAM OXALATE 20 MG PO TABS: 20.0000 mg | ORAL_TABLET | Freq: Every day | ORAL | 2 refills | Status: DC

## 2016-12-17 MED ORDER — DIAZEPAM 5 MG PO TABS: 5.0000 mg | ORAL_TABLET | Freq: Two times a day (BID) | ORAL | 0 refills | Status: DC | PRN

## 2016-12-17 NOTE — Patient Instructions (Signed)
Escitalopram tablets What is this medicine? ESCITALOPRAM (es sye TAL oh pram) is used to treat depression and certain types of anxiety. This medicine may be used for other purposes; ask your health care provider or pharmacist if you have questions. COMMON BRAND NAME(S): Lexapro What should I tell my health care provider before I take this medicine? They need to know if you have any of these conditions: -bipolar disorder or a family history of bipolar disorder -diabetes -glaucoma -heart disease -kidney or liver disease -receiving electroconvulsive therapy -seizures (convulsions) -suicidal thoughts, plans, or attempt by you or a family member -an unusual or allergic reaction to escitalopram, the related drug citalopram, other medicines, foods, dyes, or preservatives -pregnant or trying to become pregnant -breast-feeding How should I use this medicine? Take this medicine by mouth with a glass of water. Follow the directions on the prescription label. You can take it with or without food. If it upsets your stomach, take it with food. Take your medicine at regular intervals. Do not take it more often than directed. Do not stop taking this medicine suddenly except upon the advice of your doctor. Stopping this medicine too quickly may cause serious side effects or your condition may worsen. A special MedGuide will be given to you by the pharmacist with each prescription and refill. Be sure to read this information carefully each time. Talk to your pediatrician regarding the use of this medicine in children. Special care may be needed. Overdosage: If you think you have taken too much of this medicine contact a poison control center or emergency room at once. NOTE: This medicine is only for you. Do not share this medicine with others. What if I miss a dose? If you miss a dose, take it as soon as you can. If it is almost time for your next dose, take only that dose. Do not take double or extra  doses. What may interact with this medicine? Do not take this medicine with any of the following medications: -certain medicines for fungal infections like fluconazole, itraconazole, ketoconazole, posaconazole, voriconazole -cisapride -citalopram -dofetilide -dronedarone -linezolid -MAOIs like Carbex, Eldepryl, Marplan, Nardil, and Parnate -methylene blue (injected into a vein) -pimozide -thioridazine -ziprasidone This medicine may also interact with the following medications: -alcohol -amphetamines -aspirin and aspirin-like medicines -carbamazepine -certain medicines for depression, anxiety, or psychotic disturbances -certain medicines for migraine headache like almotriptan, eletriptan, frovatriptan, naratriptan, rizatriptan, sumatriptan, zolmitriptan -certain medicines for sleep -certain medicines that treat or prevent blood clots like warfarin, enoxaparin, dalteparin -cimetidine -diuretics -fentanyl -furazolidone -isoniazid -lithium -metoprolol -NSAIDs, medicines for pain and inflammation, like ibuprofen or naproxen -other medicines that prolong the QT interval (cause an abnormal heart rhythm) -procarbazine -rasagiline -supplements like St. John's wort, kava kava, valerian -tramadol -tryptophan This list may not describe all possible interactions. Give your health care provider a list of all the medicines, herbs, non-prescription drugs, or dietary supplements you use. Also tell them if you smoke, drink alcohol, or use illegal drugs. Some items may interact with your medicine. What should I watch for while using this medicine? Tell your doctor if your symptoms do not get better or if they get worse. Visit your doctor or health care professional for regular checks on your progress. Because it may take several weeks to see the full effects of this medicine, it is important to continue your treatment as prescribed by your doctor. Patients and their families should watch out for  new or worsening thoughts of suicide or depression. Also watch out for   sudden changes in feelings such as feeling anxious, agitated, panicky, irritable, hostile, aggressive, impulsive, severely restless, overly excited and hyperactive, or not being able to sleep. If this happens, especially at the beginning of treatment or after a change in dose, call your health care professional. You may get drowsy or dizzy. Do not drive, use machinery, or do anything that needs mental alertness until you know how this medicine affects you. Do not stand or sit up quickly, especially if you are an older patient. This reduces the risk of dizzy or fainting spells. Alcohol may interfere with the effect of this medicine. Avoid alcoholic drinks. Your mouth may get dry. Chewing sugarless gum or sucking hard candy, and drinking plenty of water may help. Contact your doctor if the problem does not go away or is severe. What side effects may I notice from receiving this medicine? Side effects that you should report to your doctor or health care professional as soon as possible: -allergic reactions like skin rash, itching or hives, swelling of the face, lips, or tongue -anxious -black, tarry stools -changes in vision -confusion -elevated mood, decreased need for sleep, racing thoughts, impulsive behavior -eye pain -fast, irregular heartbeat -feeling faint or lightheaded, falls -feeling agitated, angry, or irritable -hallucination, loss of contact with reality -loss of balance or coordination -loss of memory -painful or prolonged erections -restlessness, pacing, inability to keep still -seizures -stiff muscles -suicidal thoughts or other mood changes -trouble sleeping -unusual bleeding or bruising -unusually weak or tired -vomiting Side effects that usually do not require medical attention (report to your doctor or health care professional if they continue or are bothersome): -changes in appetite -change in sex  drive or performance -headache -increased sweating -indigestion, nausea -tremors This list may not describe all possible side effects. Call your doctor for medical advice about side effects. You may report side effects to FDA at 1-800-FDA-1088. Where should I keep my medicine? Keep out of reach of children. Store at room temperature between 15 and 30 degrees C (59 and 86 degrees F). Throw away any unused medicine after the expiration date. NOTE: This sheet is a summary. It may not cover all possible information. If you have questions about this medicine, talk to your doctor, pharmacist, or health care provider.  2018 Elsevier/Gold Standard (2015-09-26 13:20:23) Living With Anxiety After being diagnosed with an anxiety disorder, you may be relieved to know why you have felt or behaved a certain way. It is natural to also feel overwhelmed about the treatment ahead and what it will mean for your life. With care and support, you can manage this condition and recover from it. How to cope with anxiety Dealing with stress Stress is your body's reaction to life changes and events, both good and bad. Stress can last just a few hours or it can be ongoing. Stress can play a major role in anxiety, so it is important to learn both how to cope with stress and how to think about it differently. Talk with your health care provider or a counselor to learn more about stress reduction. He or she may suggest some stress reduction techniques, such as:  Music therapy. This can include creating or listening to music that you enjoy and that inspires you.  Mindfulness-based meditation. This involves being aware of your normal breaths, rather than trying to control your breathing. It can be done while sitting or walking.  Centering prayer. This is a kind of meditation that involves focusing on a word, phrase, or   sacred image that is meaningful to you and that brings you peace.  Deep breathing. To do this, expand your  stomach and inhale slowly through your nose. Hold your breath for 3-5 seconds. Then exhale slowly, allowing your stomach muscles to relax.  Self-talk. This is a skill where you identify thought patterns that lead to anxiety reactions and correct those thoughts.  Muscle relaxation. This involves tensing muscles then relaxing them.  Choose a stress reduction technique that fits your lifestyle and personality. Stress reduction techniques take time and practice. Set aside 5-15 minutes a day to do them. Therapists can offer training in these techniques. The training may be covered by some insurance plans. Other things you can do to manage stress include:  Keeping a stress diary. This can help you learn what triggers your stress and ways to control your response.  Thinking about how you respond to certain situations. You may not be able to control everything, but you can control your reaction.  Making time for activities that help you relax, and not feeling guilty about spending your time in this way.  Therapy combined with coping and stress-reduction skills provides the best chance for successful treatment. Medicines Medicines can help ease symptoms. Medicines for anxiety include:  Anti-anxiety drugs.  Antidepressants.  Beta-blockers.  Medicines may be used as the main treatment for anxiety disorder, along with therapy, or if other treatments are not working. Medicines should be prescribed by a health care provider. Relationships Relationships can play a big part in helping you recover. Try to spend more time connecting with trusted friends and family members. Consider going to couples counseling, taking family education classes, or going to family therapy. Therapy can help you and others better understand the condition. How to recognize changes in your condition Everyone has a different response to treatment for anxiety. Recovery from anxiety happens when symptoms decrease and stop  interfering with your daily activities at home or work. This may mean that you will start to:  Have better concentration and focus.  Sleep better.  Be less irritable.  Have more energy.  Have improved memory.  It is important to recognize when your condition is getting worse. Contact your health care provider if your symptoms interfere with home or work and you do not feel like your condition is improving. Where to find help and support: You can get help and support from these sources:  Self-help groups.  Online and community organizations.  A trusted spiritual leader.  Couples counseling.  Family education classes.  Family therapy.  Follow these instructions at home:  Eat a healthy diet that includes plenty of vegetables, fruits, whole grains, low-fat dairy products, and lean protein. Do not eat a lot of foods that are high in solid fats, added sugars, or salt.  Exercise. Most adults should do the following: ? Exercise for at least 150 minutes each week. The exercise should increase your heart rate and make you sweat (moderate-intensity exercise). ? Strengthening exercises at least twice a week.  Cut down on caffeine, tobacco, alcohol, and other potentially harmful substances.  Get the right amount and quality of sleep. Most adults need 7-9 hours of sleep each night.  Make choices that simplify your life.  Take over-the-counter and prescription medicines only as told by your health care provider.  Avoid caffeine, alcohol, and certain over-the-counter cold medicines. These may make you feel worse. Ask your pharmacist which medicines to avoid.  Keep all follow-up visits as told by your health care   provider. This is important. Questions to ask your health care provider  Would I benefit from therapy?  How often should I follow up with a health care provider?  How long do I need to take medicine?  Are there any long-term side effects of my medicine?  Are there any  alternatives to taking medicine? Contact a health care provider if:  You have a hard time staying focused or finishing daily tasks.  You spend many hours a day feeling worried about everyday life.  You become exhausted by worry.  You start to have headaches, feel tense, or have nausea.  You urinate more than normal.  You have diarrhea. Get help right away if:  You have a racing heart and shortness of breath.  You have thoughts of hurting yourself or others. If you ever feel like you may hurt yourself or others, or have thoughts about taking your own life, get help right away. You can go to your nearest emergency department or call:  Your local emergency services (911 in the [place]  A suicide crisis helpline, such as the National Suicide Prevention Lifeline at 1-800-273-8255. This is open 24-hours a day.  Summary  Taking steps to deal with stress can help calm you.  Medicines cannot cure anxiety disorders, but they can help ease symptoms.  Family, friends, and partners can play a big part in helping you recover from an anxiety disorder. This information is not intended to replace advice given to you by your health care provider. Make sure you discuss any questions you have with your health care provider. Document Released: 04/17/2016 Document Revised: 04/17/2016 Document Reviewed: 04/17/2016 Elsevier Interactive Patient Education  2018 Elsevier Inc.  
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
## 2016-12-24 ENCOUNTER — Ambulatory Visit (INDEPENDENT_AMBULATORY_CARE_PROVIDER_SITE_OTHER): Payer: 59 | Admitting: Diagnostic Neuroimaging

## 2016-12-24 ENCOUNTER — Encounter: Payer: Self-pay | Admitting: Diagnostic Neuroimaging

## 2016-12-24 VITALS — BP 129/82 | HR 65 | Wt 363.8 lb

## 2016-12-24 DIAGNOSIS — M62838 Other muscle spasm: Secondary | ICD-10-CM

## 2016-12-24 DIAGNOSIS — F329 Major depressive disorder, single episode, unspecified: Secondary | ICD-10-CM | POA: Diagnosis not present

## 2016-12-24 DIAGNOSIS — F32A Depression, unspecified: Secondary | ICD-10-CM

## 2016-12-24 DIAGNOSIS — G35 Multiple sclerosis: Secondary | ICD-10-CM
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
## 2016-12-25 ENCOUNTER — Encounter: Payer: Self-pay | Admitting: Physical Therapy

## 2016-12-25 ENCOUNTER — Ambulatory Visit
Admission: RE | Admit: 2016-12-25 | Discharge: 2016-12-25 | Disposition: A | Discharge status: Home / Self Care | Payer: 59 | Source: Ambulatory Visit | Attending: Physician Assistant | Admitting: Physician Assistant

## 2016-12-25 ENCOUNTER — Ambulatory Visit: Payer: 59 | Attending: Physician Assistant | Admitting: Physical Therapy

## 2016-12-25 ENCOUNTER — Telehealth: Payer: Self-pay | Admitting: *Deleted

## 2016-12-25 DIAGNOSIS — M546 Pain in thoracic spine: Principal | ICD-10-CM

## 2016-12-25 DIAGNOSIS — G8929 Other chronic pain: Secondary | ICD-10-CM

## 2016-12-25 DIAGNOSIS — M545 Low back pain, unspecified: Secondary | ICD-10-CM

## 2016-12-25 DIAGNOSIS — R262 Difficulty in walking, not elsewhere classified: Secondary | ICD-10-CM | POA: Diagnosis present

## 2016-12-25 DIAGNOSIS — M6281 Muscle weakness (generalized): Secondary | ICD-10-CM | POA: Insufficient documentation

## 2016-12-25 DIAGNOSIS — M25512 Pain in left shoulder: Secondary | ICD-10-CM | POA: Diagnosis present

## 2016-12-25 LAB — COMPREHENSIVE METABOLIC PANEL
ALT: 17 IU/L (ref 0–44)
AST: 22 IU/L (ref 0–40)
Albumin/Globulin Ratio: 1.7 (ref 1.2–2.2)
Albumin: 4.2 g/dL (ref 3.5–5.5)
Alkaline Phosphatase: 53 IU/L (ref 39–117)
BUN/Creatinine Ratio: 12 (ref 9–20)
BUN: 10 mg/dL (ref 6–20)
Bilirubin Total: 1.6 mg/dL — ABNORMAL HIGH (ref 0.0–1.2)
CALCIUM: 9.4 mg/dL (ref 8.7–10.2)
CO2: 24 mmol/L (ref 20–29)
CREATININE: 0.85 mg/dL (ref 0.76–1.27)
Chloride: 103 mmol/L (ref 96–106)
GFR calc Af Amer: 136 mL/min/{1.73_m2} (ref >59)
GFR, EST NON AFRICAN AMERICAN: 118 mL/min/{1.73_m2} (ref >59)
GLOBULIN, TOTAL: 2.5 g/dL (ref 1.5–4.5)
GLUCOSE: 93 mg/dL (ref 65–99)
Potassium: 4.7 mmol/L (ref 3.5–5.2)
SODIUM: 140 mmol/L (ref 134–144)
Total Protein: 6.7 g/dL (ref 6.0–8.5)

## 2016-12-25 LAB — CBC WITH DIFFERENTIAL/PLATELET
BASOS ABS: 0 10*3/uL (ref 0.0–0.2)
Basos: 1 %
EOS (ABSOLUTE): 0.1 10*3/uL (ref 0.0–0.4)
EOS: 1 %
HEMATOCRIT: 45.9 % (ref 37.5–51.0)
Hemoglobin: 14.5 g/dL (ref 13.0–17.7)
IMMATURE GRANULOCYTES: 0 %
Immature Grans (Abs): 0 10*3/uL (ref 0.0–0.1)
Lymphocytes Absolute: 1.2 10*3/uL (ref 0.7–3.1)
Lymphs: 22 %
MCH: 26.3 pg — ABNORMAL LOW (ref 26.6–33.0)
MCHC: 31.6 g/dL (ref 31.5–35.7)
MCV: 83 fL (ref 79–97)
MONOS ABS: 0.4 10*3/uL (ref 0.1–0.9)
Monocytes: 6 %
NEUTROS PCT: 70 %
Neutrophils Absolute: 3.9 10*3/uL (ref 1.4–7.0)
Platelets: 322 10*3/uL (ref 150–379)
RBC: 5.52 x10E6/uL (ref 4.14–5.80)
RDW: 14.3 % (ref 12.3–15.4)
WBC: 5.6 10*3/uL (ref 3.4–10.8)

## 2016-12-25 LAB — VITAMIN D 25 HYDROXY (VIT D DEFICIENCY, FRACTURES): Vit D, 25-Hydroxy: 15 ng/mL — ABNORMAL LOW (ref 30.0–100.0)
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
## 2016-12-25 NOTE — Telephone Encounter (Signed)
Spoke with patient and informed him that his Vit D is still low. Advised he increase Vitamin D supplement to 2000-4000 units per day. Patient stated he had stopped taking Vitamin D. Advised him the Bilirubin is slightly high, but stable from his prior lab. Patient stated he would start Vit D again; advised he needs to take 2000-4000 units daily. He verbalized understanding, appreciation.

## 2016-12-25 NOTE — Patient Instructions (Addendum)
  Push ball of head toward your pillow/bed Tucking chin downward Hold 5 seconds Repeat 10 times Twice a day   Bring ear toward right shoulder Hold 10 seconds Repeat 5 times Twice a day   Hold 30 seconds Repeat 3 times Twice a day   Tight up abdominals Flatten out your low back on the bed/table/floor Lift bottom upward  Hold 5 seconds Repeat 10-15 times Twice a day   Marching: Alternate left and right legs 30 times Make sure you are keeping your abdominals tight and breathing Twice a day     Lean into corner Hold 10 seconds Repeat 5 times Twice a day

## 2017-01-01 ENCOUNTER — Ambulatory Visit: Payer: 59 | Admitting: Physical Therapy

## 2017-01-01 ENCOUNTER — Encounter: Payer: Self-pay | Admitting: Physical Therapy

## 2017-01-01 DIAGNOSIS — M546 Pain in thoracic spine: Secondary | ICD-10-CM

## 2017-01-01 DIAGNOSIS — M545 Low back pain, unspecified: Secondary | ICD-10-CM

## 2017-01-01 DIAGNOSIS — R262 Difficulty in walking, not elsewhere classified: Secondary | ICD-10-CM

## 2017-01-01 DIAGNOSIS — M6281 Muscle weakness (generalized): Secondary | ICD-10-CM

## 2017-01-01 DIAGNOSIS — M25512 Pain in left shoulder: Secondary | ICD-10-CM

## 2017-01-01 NOTE — Therapy (Signed)
[facility] [address] [place], [place], [address] Phone: [phone]   Fax:  [phone]  Physical Therapy Treatment  Patient Details  Name: William Fitzgerald MRN: [id number] Date of Birth: December 25, 1985 Referring Provider: Sindy Messing, PA  Encounter Date: 01/01/2017      PT End of Session - 01/01/17 1415    Visit Number 2   Number of Visits 16   Date for PT Re-Evaluation 02/19/17   PT Start Time 1415   PT Stop Time 1457   PT Time Calculation (min) 42 min   Activity Tolerance Patient tolerated treatment well   Behavior During Therapy [facility] for tasks assessed/performed      Past Medical History:  Diagnosis Date  . Anxiety   . Depression   . DVT (deep venous thrombosis) (HCC) 2014  . MS (multiple sclerosis) (HCC)   . MS (multiple sclerosis) (HCC)     Past Surgical History:  Procedure Laterality Date  . DENTAL SURGERY  2016  . VASCULAR SURGERY Right 06/2013    There were no vitals filed for this visit.      Subjective Assessment - 01/01/17 1415    Subjective Off an on pain that began yesterday around L scapula. Tried to do exercises at home. Lower back feels a little better.    Currently in Pain? Yes   Pain Score 5    Pain Location Scapula   Pain Orientation Left                         [facility] Adult PT Treatment/Exercise - 01/01/17 0001      Exercises   Exercises Lumbar;Shoulder     Lumbar Exercises: Stretches   Passive Hamstring Stretch Limitations supine with green strap   Lower Trunk Rotation Limitations x4 each     Lumbar Exercises: Supine   Bent Knee Raise Limitations table top holds   Bridge 20 reps     Shoulder Exercises: Supine   Horizontal ABduction 20 reps   Theraband Level (Shoulder Horizontal ABduction) Level 3 (Green)   Other Supine Exercises supine diagonals green tband     Shoulder Exercises: Seated   Other Seated Exercises scapular retraction for posture     Shoulder  Exercises: Prone   Retraction Limitations retraction + ext     Manual Therapy   Manual Therapy Soft tissue mobilization;Passive ROM   Manual therapy comments edu in use of tennis ball for trigger point release   Soft tissue mobilization L upper trap, levator   Passive ROM L GHJ stretching                PT Education - 01/01/17 1500    Education provided Yes   Education Details exercise form/rationale, HEP, posture   Person(s) Educated Patient   Methods Explanation;Demonstration;Tactile cues;Verbal cues;Handout   Comprehension Verbalized understanding;Returned demonstration;Verbal cues required;Tactile cues required;Need further instruction          PT Short Term Goals - 12/25/16 1500      PT SHORT TERM GOAL #1   Title Pt will be independent in his initial HEP.    Time 4   Period Weeks   Status New   Target Date 01/17/17     PT SHORT TERM GOAL #2   Title Pt will report pain of less than/= 4/10 with standing for 1 hour.    Time 4   Period Weeks   Status New   Target Date 01/17/17  PT Long Term Goals - 12/25/16 1502      PT LONG TERM GOAL #1   Title pt will improve his FOTO from 60% limitation to </=40% limitation.    Time 8   Period Weeks   Status New   Target Date 02/19/17     PT LONG TERM GOAL #2   Title pt will be able to improve his left shoulder flexion to >/= 160 degrees in order to improve functional mobility.    Time 8   Period Weeks   Status New   Target Date 02/19/17     PT LONG TERM GOAL #3   Title Pt will improve his bilateral hamstring flexibility to >/= 70 degrees to improve functional mobility and gait.    Time 8   Period Weeks   Status New   Target Date 02/19/17     PT LONG TERM GOAL #4   Title Pt will demonstrate correct lifting body mechanics of 20# box off the floor.    Time 8   Period Weeks   Status New   Target Date 02/19/17               Plan - 01/01/17 1458    Clinical Impression Statement Pt  reported shoulder feeling better after treatment today. pt is significantly limited in bilateral hip flexibility placing strain on low back. Discussed importance of sitting upright and being aware of general posture.    PT Treatment/Interventions ADLs/Self Care Home Management;Cryotherapy;Electrical Stimulation;Iontophoresis 4mg /ml Dexamethasone;Ultrasound;Neuromuscular re-education;Balance training;Therapeutic exercise;Therapeutic activities;Functional mobility training;Stair training;Gait training;Patient/family education;Manual techniques;Dry needling;Taping;Passive range of motion;Moist Heat   PT Next Visit Plan core strength in bending/lifting activities   PT Home Exercise Plan cervical retraction, UT stretch, HS stretch, bridges, corner stretch, supine marching; prone scap retraction, figure 4 stretch;    Consulted and Agree with Plan of Care Patient      Patient will benefit from skilled therapeutic intervention in order to improve the following deficits and impairments:  Postural dysfunction, Improper body mechanics, Decreased range of motion, Decreased mobility, Difficulty walking, Pain, Impaired UE functional use, Decreased strength, Decreased activity tolerance, Decreased endurance  Visit Diagnosis: Acute bilateral low back pain without sciatica  Pain in thoracic spine  Difficulty in walking, not elsewhere classified  Muscle weakness (generalized)  Acute pain of left shoulder     Problem List Patient Active Problem List   Diagnosis Date Noted  . Major depressive disorder, recurrent episode (HCC) 07/06/2014  . Social anxiety disorder 07/06/2014  . Multiple sclerosis (HCC) 08/24/2013  . Nonspecific (abnormal) findings on radiological and other examination of skull and head 08/24/2013  . Disturbance of skin sensation 08/24/2013  . Other malaise and fatigue 08/24/2013  . Spasm of muscle 08/24/2013    Nichoals Heyde C. Jalee Saine PT, DPT 01/01/17 3:01 PM   [facility]  Health Outpatient Rehabilitation [facility] [address] [place], [place], [address] Phone: [phone]   Fax:  [phone]  Name: William Fitzgerald MRN: [id number] Date of Birth: 1985/07/30

## 2017-01-03 ENCOUNTER — Encounter: Payer: Self-pay | Admitting: Physical Therapy

## 2017-01-03 ENCOUNTER — Ambulatory Visit: Payer: 59 | Admitting: Physical Therapy

## 2017-01-03 DIAGNOSIS — M545 Low back pain, unspecified: Secondary | ICD-10-CM

## 2017-01-03 DIAGNOSIS — R262 Difficulty in walking, not elsewhere classified: Secondary | ICD-10-CM

## 2017-01-03 DIAGNOSIS — M6281 Muscle weakness (generalized): Secondary | ICD-10-CM

## 2017-01-03 DIAGNOSIS — M546 Pain in thoracic spine: Secondary | ICD-10-CM

## 2017-01-03 DIAGNOSIS — M25512 Pain in left shoulder: Secondary | ICD-10-CM

## 2017-01-03 NOTE — Therapy (Signed)
[facility] Outpatient Rehabilitation [facility] [address] [place], [place], [address] Phone: [phone]   Fax:  [phone]  Physical Therapy Treatment  Patient Details  Name: William Fitzgerald MRN: [id number] Date of Birth: February 17, 1986 Referring Provider: Sindy Messing, PA  Encounter Date: 01/03/2017      PT End of Session - 01/03/17 1504    Visit Number 3   Number of Visits 16   Date for PT Re-Evaluation 02/19/17   PT Start Time 1503   PT Stop Time 1541   PT Time Calculation (min) 38 min   Activity Tolerance Patient tolerated treatment well   Behavior During Therapy [facility] for tasks assessed/performed      Past Medical History:  Diagnosis Date  . Anxiety   . Depression   . DVT (deep venous thrombosis) (HCC) 2014  . MS (multiple sclerosis) (HCC)   . MS (multiple sclerosis) (HCC)     Past Surgical History:  Procedure Laterality Date  . DENTAL SURGERY  2016  . VASCULAR SURGERY Right 06/2013    There were no vitals filed for this visit.      Subjective Assessment - 01/03/17 1504    Subjective Tried to keep upright posture, point of pain in shoulder seems to have moved a little bit. 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                         [facility] Adult PT Treatment/Exercise - 01/03/17 0001      Lumbar Exercises: Standing   Other Standing Lumbar Exercises upright posture against wall     Lumbar Exercises: Supine   Other Supine Lumbar Exercises round back with green band overhead     Lumbar Exercises: Prone   Opposite Arm/Leg Raise Right arm/Left leg;Left arm/Right leg;15 reps     Shoulder Exercises: Prone   Retraction Limitations retraction   Flexion 15 reps   Horizontal ABduction 1 15 reps     Shoulder Exercises: Standing   External Rotation 15 reps   Theraband Level (Shoulder External  Rotation) Level 4 (Blue)  GHJ abd to 90   Row 15 reps   Theraband Level (Shoulder Row) Level 4 (Blue)   Row Limitations elbows wide     Shoulder Exercises: ROM/Strengthening   UBE (Upper Arm Bike) retro3 min L3     Manual Therapy   Manual Therapy Joint mobilization   Joint Mobilization prone rib ER   Soft tissue mobilization IASTM L periscapular region                PT Education - 01/03/17 1543    Education provided Yes   Education Details upright posture, review of some other exercises he was doing at home, exercise form/rationale   Person(s) Educated Patient   Methods Explanation;Demonstration;Tactile cues;Verbal cues;Handout   Comprehension Verbalized understanding;Returned demonstration;Verbal cues required;Tactile cues required;Need further instruction          PT Short Term Goals - 12/25/16 1500      PT SHORT TERM GOAL #1   Title Pt will be independent in his initial HEP.    Time 4   Period Weeks   Status New   Target Date 01/17/17     PT SHORT TERM GOAL #2   Title Pt will report pain of less than/= 4/10 with standing  for 1 hour.    Time 4   Period Weeks   Status New   Target Date 01/17/17           PT Long Term Goals - 12/25/16 1502      PT LONG TERM GOAL #1   Title pt will improve his FOTO from 60% limitation to </=40% limitation.    Time 8   Period Weeks   Status New   Target Date 02/19/17     PT LONG TERM GOAL #2   Title pt will be able to improve his left shoulder flexion to >/= 160 degrees in order to improve functional mobility.    Time 8   Period Weeks   Status New   Target Date 02/19/17     PT LONG TERM GOAL #3   Title Pt will improve his bilateral hamstring flexibility to >/= 70 degrees to improve functional mobility and gait.    Time 8   Period Weeks   Status New   Target Date 02/19/17     PT LONG TERM GOAL #4   Title Pt will demonstrate correct lifting body mechanics of 20# box off the floor.    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 Significant difficulty noted today with extension activities. C sit position for core was difficult and tnded to slouch creating discomfort in low back.    PT Treatment/Interventions ADLs/Self Care Home Management;Cryotherapy;Electrical Stimulation;Iontophoresis 4mg /ml Dexamethasone;Ultrasound;Neuromuscular re-education;Balance training;Therapeutic exercise;Therapeutic activities;Functional mobility training;Stair training;Gait training;Patient/family education;Manual techniques;Dry needling;Taping;Passive range of motion;Moist Heat   PT Next Visit Plan postural extension strengthening   PT Home Exercise Plan cervical retraction, UT stretch, HS stretch, bridges, corner stretch, supine marching; prone scap retraction, figure 4 stretch; superman, row & ER at 90 GHJ abd   Consulted and Agree with Plan of Care Patient      Patient will benefit from skilled therapeutic intervention in order to improve the following deficits and impairments:  Postural dysfunction, Improper body mechanics, Decreased range of motion, Decreased mobility, Difficulty walking, Pain, Impaired UE functional use, Decreased strength, Decreased activity tolerance, Decreased endurance  Visit Diagnosis: Acute bilateral low back pain without sciatica  Pain in thoracic spine  Difficulty in walking, not elsewhere classified  Muscle weakness (generalized)  Acute pain of left shoulder     Problem List Patient Active Problem List   Diagnosis Date Noted  . Major depressive disorder, recurrent episode (HCC) 07/06/2014  . Social anxiety disorder 07/06/2014  . Multiple sclerosis (HCC) 08/24/2013  . Nonspecific (abnormal) findings on radiological and other examination of skull and head 08/24/2013  .  Disturbance of skin sensation 08/24/2013  . Other malaise and fatigue 08/24/2013  . Spasm of muscle 08/24/2013   Lynnie Koehler C. Tracy Gerken PT, DPT 01/03/17 3:46 PM   [facility] Health Outpatient Rehabilitation [facility] [address] [place], [place], [address] Phone: [phone]   Fax:  [phone]  Name: William Fitzgerald MRN: [id number] Date of Birth: [birth year]

## 2017-01-06 ENCOUNTER — Ambulatory Visit
Admission: RE | Admit: 2017-01-06 | Discharge: 2017-01-06 | Disposition: A | Discharge status: Home / Self Care | Payer: 59 | Source: Ambulatory Visit | Attending: Diagnostic Neuroimaging | Admitting: Diagnostic Neuroimaging

## 2017-01-06 DIAGNOSIS — G35 Multiple sclerosis: Secondary | ICD-10-CM | POA: Diagnosis not present

## 2017-01-06 MED ORDER — GADOBENATE DIMEGLUMINE 529 MG/ML IV SOLN
20.0000 mL | Freq: Once | INTRAVENOUS | Status: AC | PRN
  Administered 2017-01-06: 20 mL via INTRAVENOUS

## 2017-01-08 ENCOUNTER — Telehealth: Payer: Self-pay | Admitting: *Deleted

## 2017-01-08 NOTE — Telephone Encounter (Signed)
LVM requesting call back re: MRI results. 

## 2017-01-08 NOTE — Telephone Encounter (Signed)
Spoke with patient and informed him his MRI brain result showed stable imaging results. Advised him there are no new plaques; Dr Marjory Lies will continue with his current treatment plan. Patient stated that he wants Dr Marjory Lies to know that the contrast made him nauseated again. He stated he told the MRI staff who stated they would dilute the contrast, but he still became sick. This RN will let Dr Marjory Lies know as patient requested. This is already noted in patient's allergies. Patient verbalized understanding appreciation of call.

## 2017-01-09 ENCOUNTER — Encounter (HOSPITAL_COMMUNITY): Payer: Self-pay | Admitting: Licensed Clinical Social Worker

## 2017-01-09 ENCOUNTER — Ambulatory Visit (INDEPENDENT_AMBULATORY_CARE_PROVIDER_SITE_OTHER): Payer: 59 | Admitting: Licensed Clinical Social Worker

## 2017-01-09 DIAGNOSIS — F331 Major depressive disorder, recurrent, moderate: Secondary | ICD-10-CM | POA: Diagnosis not present

## 2017-01-09 DIAGNOSIS — F411 Generalized anxiety disorder: Secondary | ICD-10-CM

## 2017-01-09 NOTE — Progress Notes (Signed)
   THERAPIST PROGRESS NOTE  Session Time: 9-10  Participation Level: Active  Behavioral Response: Casual and Fairly GroomedAlertEuthymic  Type of Therapy: Individual Therapy  Treatment Goals addressed: Anxiety and Diagnosis: MDD  Interventions: CBT, Motivational Interviewing, Supportive and Reframing  Summary: William Fitzgerald is a 31 y.o. male who presents for help w/ anxiety and depression. He reports his symptoms are getting better as he is not "as worried or getting as easily frustrated w/ people at work". Pt reports he is trying to walk more often at least 2 times per week. He asks the counselor if he can get more Valium bc "it helps him be more professional at work". Counselor and pt discuss ways that pt endorses cognitive distortions that prevent him from making close connections w/ others and feeling successful at work. Pt admits he "puts things off and has trouble focusing" daily. He has recently started Lexapro and Valium as px by Lily Kocher, [place] who pt saw 2 weeks ago. Counselor encouraged pt to continue medications since they appear to be working well. Pt denied intrusive thoughts of wanting to die.  Suicidal/Homicidal: Nowithout intent/plan  Therapist Response: Counselor used reframing and open person centered questions to help pt identify ways that his behavior has changed recently. Counselor encouraged pt to continue utilizing the methods that appear to be helping. Counselor encouraged pt to return in 2 weeks for f/u.  Plan: Return again in 2 weeks.  Diagnosis:    ICD-10-CM   1. Major depressive disorder, recurrent episode, moderate (HCC) F33.1   2. Generalized anxiety disorder F41.1        Margo Common, LCAS-A 01/09/2017

## 2017-01-10 ENCOUNTER — Ambulatory Visit: Payer: 59 | Admitting: Physical Therapy

## 2017-01-15 ENCOUNTER — Ambulatory Visit: Payer: 59 | Attending: Physician Assistant | Admitting: Physical Therapy

## 2017-01-15 ENCOUNTER — Encounter: Payer: Self-pay | Admitting: Physical Therapy

## 2017-01-15 DIAGNOSIS — R262 Difficulty in walking, not elsewhere classified: Secondary | ICD-10-CM | POA: Diagnosis present

## 2017-01-15 DIAGNOSIS — M545 Low back pain, unspecified: Secondary | ICD-10-CM

## 2017-01-15 DIAGNOSIS — M6281 Muscle weakness (generalized): Secondary | ICD-10-CM | POA: Diagnosis present

## 2017-01-15 DIAGNOSIS — M546 Pain in thoracic spine: Secondary | ICD-10-CM | POA: Diagnosis present

## 2017-01-15 DIAGNOSIS — M25512 Pain in left shoulder: Secondary | ICD-10-CM | POA: Insufficient documentation

## 2017-01-15 NOTE — Therapy (Signed)
[facility] Outpatient Rehabilitation [facility] [address] [place], [place], [address] Phone: [phone]   Fax:  [phone]  Physical Therapy Treatment  Patient Details  Name: William Fitzgerald MRN: [id number] Date of Birth: May 10, 1985 Referring Provider: Sindy Messing, PA  Encounter Date: 01/15/2017      PT End of Session - 01/15/17 1149    Visit Number 4   Number of Visits 16   Date for PT Re-Evaluation 02/19/17   PT Start Time 1149   PT Stop Time 1228   PT Time Calculation (min) 39 min   Activity Tolerance Patient tolerated treatment well   Behavior During Therapy [facility] for tasks assessed/performed      Past Medical History:  Diagnosis Date  . Anxiety   . Depression   . DVT (deep venous thrombosis) (HCC) 2014  . MS (multiple sclerosis) (HCC)   . MS (multiple sclerosis) (HCC)     Past Surgical History:  Procedure Laterality Date  . DENTAL SURGERY  2016  . VASCULAR SURGERY Right 06/2013    There were no vitals filed for this visit.      Subjective Assessment - 01/15/17 1149    Subjective Not really having any pain today. tried wall push ups which weren't too bad. Feels that upper body is weak. 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 No/denies                         [facility] Adult PT Treatment/Exercise - 01/15/17 0001      Lumbar Exercises: Aerobic   Stationary Bike nu step 5 min L9 UE & LE     Shoulder Exercises: Standing   Horizontal ABduction 20 reps   Horizontal ABduction Weight (lbs) 2   Horizontal ABduction Limitations flexed, weight through hand on table   Flexion 20 reps   Theraband Level (Shoulder Flexion) Level 4 (Blue)   Flexion Limitations +slight horiz abd pull on band, leaning against wall   ABduction 20 reps   Theraband Level  (Shoulder ABduction) Level 4 (Blue)   ABduction Limitations band behind back   Row 20 reps   Row Weight (lbs) 2   Row Limitations bent over row   Other Standing Exercises triceps kicks     Shoulder Exercises: Lawyer Limitations door pec stretch   Other Shoulder Stretches upper trap & levator stretches     Manual Therapy   Soft tissue mobilization L upper trap IASTM                PT Education - 01/15/17 1230    Education provided Yes   Education Details posture at work, exercise form/rationale   Person(s) Educated Patient   Methods Explanation;Demonstration;Tactile cues;Verbal cues   Comprehension Verbalized understanding;Returned demonstration;Verbal cues required;Tactile cues required;Need further instruction          PT Short Term Goals - 12/25/16 1500      PT SHORT TERM GOAL #1   Title Pt will be independent in his initial HEP.    Time 4   Period Weeks   Status New   Target Date 01/17/17     PT SHORT TERM GOAL #2   Title Pt will report pain of less than/= 4/10 with standing for  1 hour.    Time 4   Period Weeks   Status New   Target Date 01/17/17           PT Long Term Goals - 12/25/16 1502      PT LONG TERM GOAL #1   Title pt will improve his FOTO from 60% limitation to </=40% limitation.    Time 8   Period Weeks   Status New   Target Date 02/19/17     PT LONG TERM GOAL #2   Title pt will be able to improve his left shoulder flexion to >/= 160 degrees in order to improve functional mobility.    Time 8   Period Weeks   Status New   Target Date 02/19/17     PT LONG TERM GOAL #3   Title Pt will improve his bilateral hamstring flexibility to >/= 70 degrees to improve functional mobility and gait.    Time 8   Period Weeks   Status New   Target Date 02/19/17     PT LONG TERM GOAL #4   Title Pt will demonstrate correct lifting body mechanics of 20# box off the floor.    Time 8   Period Weeks   Status New   Target Date  02/19/17               Plan - 01/15/17 1228    Clinical Impression Statement Cont dominance in L upper trap leading to overuse pain. focused on exercises today to work postural extensors in a flexed position to mimick work positioning. Heavy cuing required. Discussed importance of improving posture while at work to decrease pain.    PT Treatment/Interventions ADLs/Self Care Home Management;Cryotherapy;Electrical Stimulation;Iontophoresis /ml Dexamethasone;Ultrasound;Neuromuscular re-education;Balance training;Therapeutic exercise;Therapeutic activities;Functional mobility training;Stair training;Gait training;Patient/family education;Manual techniques;Dry needling;Taping;Passive range of motion;Moist Heat   PT Next Visit Plan postural extension strengthening   PT Home Exercise Plan cervical retraction, UT stretch, HS stretch, bridges, corner stretch, supine marching; prone scap retraction, figure 4 stretch; superman, row & ER at 90 GHJ abd   Consulted and Agree with Plan of Care Patient      Patient will benefit from skilled therapeutic intervention in order to improve the following deficits and impairments:  Postural dysfunction, Improper body mechanics, Decreased range of motion, Decreased mobility, Difficulty walking, Pain, Impaired UE functional use, Decreased strength, Decreased activity tolerance, Decreased endurance  Visit Diagnosis: Acute bilateral low back pain without sciatica  Pain in thoracic spine  Difficulty in walking, not elsewhere classified  Muscle weakness (generalized)  Acute pain of left shoulder     Problem List Patient Active Problem List   Diagnosis Date Noted  . Major depressive disorder, recurrent episode (HCC) 07/06/2014  . Social anxiety disorder 07/06/2014  . Multiple sclerosis (HCC) 08/24/2013  . Nonspecific (abnormal) findings on radiological and other examination of skull and head 08/24/2013  . Disturbance of skin sensation 08/24/2013  .  Other malaise and fatigue 08/24/2013  . Spasm of muscle 08/24/2013   Pooja Camuso C. Bladyn Tipps PT, DPT 01/15/17 12:31 PM   [facility] Health Outpatient Rehabilitation [facility] [address] [place], [place], [address] Phone: [phone]   Fax:  [phone]  Name: ZYKEEM BAUSERMAN MRN: [id number] Date of Birth: 1986/02/29

## 2017-01-17 ENCOUNTER — Ambulatory Visit: Payer: 59 | Admitting: Physical Therapy

## 2017-01-17 ENCOUNTER — Encounter: Payer: Self-pay | Admitting: Physical Therapy

## 2017-01-17 DIAGNOSIS — M545 Low back pain, unspecified: Secondary | ICD-10-CM

## 2017-01-17 DIAGNOSIS — R262 Difficulty in walking, not elsewhere classified: Secondary | ICD-10-CM

## 2017-01-17 DIAGNOSIS — M546 Pain in thoracic spine: Secondary | ICD-10-CM

## 2017-01-17 DIAGNOSIS — M25512 Pain in left shoulder: Secondary | ICD-10-CM

## 2017-01-17 DIAGNOSIS — M6281 Muscle weakness (generalized): Secondary | ICD-10-CM

## 2017-01-17 NOTE — Therapy (Signed)
[facility] Outpatient Rehabilitation [facility] [address] [place], [place], [address] Phone: [phone]   Fax:  [phone]  Physical Therapy Treatment  Patient Details  Name: William Fitzgerald MRN: [id number] Date of Birth: 27-Jul-1985 Referring Provider: Sindy Messing, PA  Encounter Date: 01/17/2017      PT End of Session - 01/17/17 1218    Visit Number 5   Number of Visits 16   Date for PT Re-Evaluation 02/19/17   PT Start Time 1145   PT Stop Time 1230   PT Time Calculation (min) 45 min   Activity Tolerance Patient tolerated treatment well   Behavior During Therapy [facility] for tasks assessed/performed      Past Medical History:  Diagnosis Date  . Anxiety   . Depression   . DVT (deep venous thrombosis) (HCC) 2014  . MS (multiple sclerosis) (HCC)   . MS (multiple sclerosis) (HCC)     Past Surgical History:  Procedure Laterality Date  . DENTAL SURGERY  2016  . VASCULAR SURGERY Right 06/2013    There were no vitals filed for this visit.      Subjective Assessment - 01/17/17 1153    Subjective Pt reporting pain today in left shoulder/neck of 4/10. pt did report low back pain when sweeping at work last night.    Limitations Sitting   How long can you sit comfortably? I have to make sure my back is supported   How long can you stand comfortably? 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 Yes   Pain Score 4    Pain Location Shoulder  neck   Pain Orientation Left   Pain Descriptors / Indicators Aching   Pain Type Acute pain   Pain Onset More than a month ago   Pain Frequency Constant   Aggravating Factors  bending, lifting, standing long periods   Pain Relieving Factors massages, lying down   Effect of Pain on Daily Activities difficulty with working as a Financial risk analyst   Multiple Pain Sites Yes   Pain Score 4    Pain Location Back   Pain Descriptors / Indicators Aching   Pain Type Acute pain   Pain Frequency Constant   Aggravating Factors  bending, reaching, lifting   Pain Relieving Factors massage,    Effect of Pain on Daily Activities difficutly with standing at work all day                         [facility] Adult PT Treatment/Exercise - 01/17/17 0001      Lumbar Exercises: Aerobic   Stationary Bike nu step 8 min L9 UE & LE     Shoulder Exercises: Standing   Flexion 20 reps   Theraband Level (Shoulder Flexion) Level 4 (Blue)   Flexion Limitations +slight horiz abd pull on band, leaning against wall   ABduction 20 reps   Theraband Level (Shoulder ABduction) Level 4 (Blue)   ABduction Limitations band behind back   Row 20 reps   Theraband Level (Shoulder Row) Level 4 (Blue)     Manual Therapy   Soft tissue mobilization L upper trap IASTM                PT Education - 01/17/17 1218    Education Details posture while sleeping on his side with pillow  support   Person(s) Educated Patient   Methods Explanation;Demonstration   Comprehension Verbalized understanding;Returned demonstration          PT Short Term Goals - 12/25/16 1500      PT SHORT TERM GOAL #1   Title Pt will be independent in his initial HEP.    Time 4   Period Weeks   Status New   Target Date 01/17/17     PT SHORT TERM GOAL #2   Title Pt will report pain of less than/= 4/10 with standing for 1 hour.    Time 4   Period Weeks   Status New   Target Date 01/17/17           PT Long Term Goals - 01/17/17 1238      PT LONG TERM GOAL #1   Title pt will improve his FOTO from 60% limitation to </=40% limitation.    Period Weeks   Status New     PT LONG TERM GOAL #2   Title pt will be able to improve his left shoulder flexion to >/= 160 degrees in order to improve functional mobility.    Status On-going     PT LONG TERM GOAL #3   Title Pt will improve his bilateral hamstring  flexibility to >/= 70 degrees to improve functional mobility and gait.    Period Weeks   Status New     PT LONG TERM GOAL #4   Title Pt will demonstrate correct lifting body mechanics of 20# box off the floor.    Period Weeks   Status New               Plan - 01/17/17 1236    Clinical Impression Statement Pt arriving to therapy complaining of left shoulder/upper trap pain and neck pain, at end of session pt reporting pain decreased to 1/10. Pt arriving to therapy with 4/10 LBP and leaving therapy following exercisess with 1-2/10 pain. pt is making progress with core strengthening and shoulder strengthening. Pt also educated in posture correction while sleeping on his side using pillow supports. Continue with skilled PT to progress toward pt's LTG's.    Rehab Potential Good   Clinical Impairments Affecting Rehab Potential MS   PT Frequency 2x / week   PT Duration 8 weeks   PT Treatment/Interventions ADLs/Self Care Home Management;Cryotherapy;Electrical Stimulation;Iontophoresis /ml Dexamethasone;Ultrasound;Neuromuscular re-education;Balance training;Therapeutic exercise;Therapeutic activities;Functional mobility training;Stair training;Gait training;Patient/family education;Manual techniques;Dry needling;Taping;Passive range of motion;Moist Heat   PT Next Visit Plan postural extension strengthening   PT Home Exercise Plan cervical retraction, UT stretch, HS stretch, bridges, corner stretch, supine marching; prone scap retraction, figure 4 stretch; superman, row & ER at 90 GHJ abd   Consulted and Agree with Plan of Care Patient      Patient will benefit from skilled therapeutic intervention in order to improve the following deficits and impairments:  Postural dysfunction, Improper body mechanics, Decreased range of motion, Decreased mobility, Difficulty walking, Pain, Impaired UE functional use, Decreased strength, Decreased activity tolerance, Decreased endurance  Visit  Diagnosis: Acute bilateral low back pain without sciatica  Pain in thoracic spine  Difficulty in walking, not elsewhere classified  Muscle weakness (generalized)  Acute pain of left shoulder     Problem List Patient Active Problem List   Diagnosis Date Noted  . Major depressive disorder, recurrent episode (HCC) 07/06/2014  . Social anxiety disorder 07/06/2014  . Multiple sclerosis (HCC) 08/24/2013  . Nonspecific (abnormal) findings on radiological and other examination of skull  and head 08/24/2013  . Disturbance of skin sensation 08/24/2013  . Other malaise and fatigue 08/24/2013  . Spasm of muscle 08/24/2013    Sharmon Leyden, MPT 01/17/2017, 12:39 PM  [facility] [address] [place], [place], [address] Phone: [phone]   Fax:  [phone]  Name: William Fitzgerald MRN: [id number] Date of Birth: 1986-02-05

## 2017-01-22 ENCOUNTER — Ambulatory Visit: Payer: 59 | Admitting: Physical Therapy

## 2017-01-23 ENCOUNTER — Ambulatory Visit (INDEPENDENT_AMBULATORY_CARE_PROVIDER_SITE_OTHER): Payer: 59 | Admitting: Licensed Clinical Social Worker

## 2017-01-23 DIAGNOSIS — F411 Generalized anxiety disorder: Secondary | ICD-10-CM

## 2017-01-23 DIAGNOSIS — F331 Major depressive disorder, recurrent, moderate: Secondary | ICD-10-CM | POA: Diagnosis not present

## 2017-01-24 ENCOUNTER — Ambulatory Visit: Payer: 59 | Admitting: Physical Therapy

## 2017-01-24 ENCOUNTER — Encounter: Payer: Self-pay | Admitting: Physical Therapy

## 2017-01-24 ENCOUNTER — Encounter (HOSPITAL_COMMUNITY): Payer: Self-pay | Admitting: Licensed Clinical Social Worker

## 2017-01-24 DIAGNOSIS — M545 Low back pain, unspecified: Secondary | ICD-10-CM

## 2017-01-24 DIAGNOSIS — M25512 Pain in left shoulder: Secondary | ICD-10-CM

## 2017-01-24 DIAGNOSIS — M546 Pain in thoracic spine: Secondary | ICD-10-CM

## 2017-01-24 DIAGNOSIS — M6281 Muscle weakness (generalized): Secondary | ICD-10-CM

## 2017-01-24 DIAGNOSIS — R262 Difficulty in walking, not elsewhere classified: Secondary | ICD-10-CM

## 2017-01-24 NOTE — Therapy (Signed)
[facility] Outpatient Rehabilitation [facility] [address] [place], [place], [address] Phone: [phone]   Fax:  [phone]  Physical Therapy Treatment  Patient Details  Name: William Fitzgerald MRN: [id number] Date of Birth: 1985/05/25 Referring Provider: Sindy Messing, PA  Encounter Date: 01/24/2017      PT End of Session - 01/24/17 1159    Visit Number 6   Number of Visits 16   Date for PT Re-Evaluation 02/19/17   PT Start Time 1158  pt arrived late   PT Stop Time 1232   PT Time Calculation (min) 34 min   Activity Tolerance Patient tolerated treatment well   Behavior During Therapy [facility] for tasks assessed/performed      Past Medical History:  Diagnosis Date  . Anxiety   . Depression   . DVT (deep venous thrombosis) (HCC) 2014  . MS (multiple sclerosis) (HCC)   . MS (multiple sclerosis) (HCC)     Past Surgical History:  Procedure Laterality Date  . DENTAL SURGERY  2016  . VASCULAR SURGERY Right 06/2013    There were no vitals filed for this visit.      Subjective Assessment - 01/24/17 1159    Subjective Feeling okay today. Found he was bending his knee during SLR. Has been really busy with school and work, high levels of stress. Has been able to elevate his cutting board at work whcih is helpful. Finds it helpful to lay flat on floor.    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 No/denies   Pain Location Shoulder   Pain Score 1   Pain Location Back   Pain Orientation Mid                         [facility] Adult PT Treatment/Exercise - 01/24/17 0001      Lumbar Exercises: Standing   Other Standing Lumbar Exercises resisted trunk rotation blue tband   Other Standing Lumbar Exercises resisted CHOPS blue tband     Shoulder Exercises: Standing   Flexion 15 reps   Shoulder Flexion Weight (lbs) 2   Flexion Limitations in mirror to 90 to reduce elevation    ABduction 20 reps   Shoulder ABduction Weight (lbs) 2   ABduction Limitations in mirror to 90 to reduce elevation   Row Limitations upright row 10lb kettle bell     Shoulder Exercises: ROM/Strengthening   UBE (Upper Arm Bike) retro L1 5 min   Other ROM/Strengthening Exercises row machine, lat pull down machine- for lats & triceps     Shoulder Exercises: Stretch   Corner Stretch Limitations supine pec stretch   Other Shoulder Stretches seated upper trap stretch   Other Shoulder Stretches open book 3x10 each                  PT Short Term Goals - 12/25/16 1500      PT SHORT TERM GOAL #1   Title Pt will be independent in his initial HEP.    Time 4   Period Weeks   Status New   Target Date 01/17/17     PT SHORT TERM GOAL #2   Title Pt will report pain of less than/= 4/10 with standing for 1 hour.    Time 4   Period Weeks   Status New   Target Date 01/17/17           PT  Long Term Goals - 01/17/17 1238      PT LONG TERM GOAL #1   Title pt will improve his FOTO from 60% limitation to </=40% limitation.    Period Weeks   Status New     PT LONG TERM GOAL #2   Title pt will be able to improve his left shoulder flexion to >/= 160 degrees in order to improve functional mobility.    Status On-going     PT LONG TERM GOAL #3   Title Pt will improve his bilateral hamstring flexibility to >/= 70 degrees to improve functional mobility and gait.    Period Weeks   Status New     PT LONG TERM GOAL #4   Title Pt will demonstrate correct lifting body mechanics of 20# box off the floor.    Period Weeks   Status New               Plan - 01/24/17 1240    Clinical Impression Statement Exercises to focus on depression of GHJ which was difficult for pt. 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 Denied increase in pain with exercises.    PT Treatment/Interventions ADLs/Self Care Home Management;Cryotherapy;Electrical Stimulation;Iontophoresis /ml  Dexamethasone;Ultrasound;Neuromuscular re-education;Balance training;Therapeutic exercise;Therapeutic activities;Functional mobility training;Stair training;Gait training;Patient/family education;Manual techniques;Dry needling;Taping;Passive range of motion;Moist Heat   PT Next Visit Plan prone extension exercises, core strengthening   PT Home Exercise Plan cervical retraction, UT stretch, HS stretch, bridges, corner stretch, supine marching; prone scap retraction, figure 4 stretch; superman, row & ER at 90 GHJ abd; chops,open book   Consulted and Agree with Plan of Care Patient      Patient will benefit from skilled therapeutic intervention in order to improve the following deficits and impairments:  Postural dysfunction, Improper body mechanics, Decreased range of motion, Decreased mobility, Difficulty walking, Pain, Impaired UE functional use, Decreased strength, Decreased activity tolerance, Decreased endurance  Visit Diagnosis: Acute bilateral low back pain without sciatica  Pain in thoracic spine  Difficulty in walking, not elsewhere classified  Muscle weakness (generalized)  Acute pain of left shoulder     Problem List Patient Active Problem List   Diagnosis Date Noted  . Major depressive disorder, recurrent episode (HCC) 07/06/2014  . Social anxiety disorder 07/06/2014  . Multiple sclerosis (HCC) 08/24/2013  . Nonspecific (abnormal) findings on radiological and other examination of skull and head 08/24/2013  . Disturbance of skin sensation 08/24/2013  . Other malaise and fatigue 08/24/2013  . Spasm of muscle 08/24/2013    Adriano Bischof C. Pantelis Elgersma PT, DPT 01/24/17 12:45 PM   [facility] Outpatient Rehabilitation [facility] [address] [place], [place], [address] Phone: [phone]   Fax:  [phone]  Name: William Fitzgerald MRN: [id number] Date of Birth: [birth year]

## 2017-01-24 NOTE — Progress Notes (Signed)
   THERAPIST PROGRESS NOTE  Session Time: 9-10  Participation Level: Active  Behavioral Response: Casual and DisheveledAlertAnxious  Type of Therapy: Individual Therapy  Treatment Goals addressed: Anxiety and Coping  Interventions: CBT  Summary: William Fitzgerald is a 31 y.o. male who presents with anxiety, worry, and tension related to his depression and mood lability. He reports he is "very stressed today" and could not sleep very much last night. Pt reports he has been worried about "not achieving his goals, daily" but when asked for clarification pt cannot specify his hurt or dysfunction. Pt appears irritable and confused in session since "no one takes me seriously". Pt and counselor discuss pt's lack of focus and inability to complete his goals. Counselor leads pt in 5 min mindfulness "concentration script" which pt engages in but says "does not work". Counselor normalizes a learning curve w/ meditation and encourages pt to continue. Pt identifies coping skills for his anxiety including "I need to take more time off and go out in nature". Counselor and pt identify specific plans for pt to go hiking this Fall.   Pt discusses how "no one really knows what's going on w/ my thoughts and when I was feeling suicidal, except my grandmother". Counselor normalizes feeling ashamed when cultural stigma prevents pt from discussing his issues w/ his loved ones. Pt reports he feels "better" towards end of session.   Suicidal/Homicidal: Nowithout intent/plan  Therapist Response: Counselor used open questions and genuine positive regard to encourage pt disclosure of anxiety and depression. Pt appears frustrated that "his life is not where he wants it to be" but he also appears currently incapable of sustaining enough focus and follow through to achieve his short term goals. Counselor used measurable, solution-focused goal planning to help pt gain sense of success.  Plan: Return again in 2  weeks.  Diagnosis:    ICD-10-CM   1. Major depressive disorder, recurrent episode, moderate (HCC) F33.1   2. Generalized anxiety disorder F41.1        Margo Common, LCAS-A 01/24/2017

## 2017-01-29 ENCOUNTER — Encounter: Payer: Self-pay | Admitting: Physical Therapy

## 2017-01-29 ENCOUNTER — Ambulatory Visit: Payer: 59 | Admitting: Physical Therapy

## 2017-01-29 DIAGNOSIS — M25512 Pain in left shoulder: Secondary | ICD-10-CM

## 2017-01-29 DIAGNOSIS — M6281 Muscle weakness (generalized): Secondary | ICD-10-CM

## 2017-01-29 DIAGNOSIS — M546 Pain in thoracic spine: Secondary | ICD-10-CM

## 2017-01-29 DIAGNOSIS — R262 Difficulty in walking, not elsewhere classified: Secondary | ICD-10-CM

## 2017-01-29 DIAGNOSIS — M545 Low back pain, unspecified: Secondary | ICD-10-CM

## 2017-01-29 NOTE — Therapy (Signed)
[facility], [place], [address] Phone: [phone]   Fax:  [phone]  Physical Therapy Treatment/Discharge Summary  Patient Details  Name: William Fitzgerald MRN: [id number] Date of Birth: 1986-03-15 Referring Provider: Domenica Fail, PA  Encounter Date: 01/29/2017      PT End of Session - 01/29/17 1105    Visit Number 7   Number of Visits 16   Date for PT Re-Evaluation 02/19/17   PT Start Time 1102   PT Stop Time 1126   PT Time Calculation (min) 24 min   Activity Tolerance Patient tolerated treatment well   Behavior During Therapy [name] for tasks assessed/performed      Past Medical History:  Diagnosis Date  . Anxiety   . Depression   . DVT (deep venous thrombosis) ([place]) 2014  . MS (multiple sclerosis) ([place])   . MS (multiple sclerosis) ([place])     Past Surgical History:  Procedure Laterality Date  . DENTAL SURGERY  2016  . VASCULAR SURGERY Right 06/2013    There were no vitals filed for this visit.      Subjective Assessment - 01/29/17 1106    Subjective only pain when sleeping on L shoulder. has been super busy and not done exercises. Overall I feel fine and am ready for discharge.    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 No/denies            [facility] PT Assessment - 01/29/17 0001      AROM   Overall AROM  Within functional limits for tasks performed  no pain                     [facility] Adult PT Treatment/Exercise - 01/29/17 0001      Lumbar Exercises: Aerobic   Elliptical L1 ramp 10 5 min                PT Education - 01/29/17 1135    Education provided Yes   Education Details importance of continued exercise, be mindful of MS, goals discussion   Person(s) Educated Patient   Methods Explanation   Comprehension Verbalized understanding          PT Short Term Goals - 01/29/17 1113       PT SHORT TERM GOAL #1   Title Pt will be independent in his initial HEP.    Baseline verbalizes independence   Status Achieved     PT SHORT TERM GOAL #2   Title Pt will report pain of less than/= 4/10 with standing for 1 hour.    Baseline reports he is doing fine   Status Achieved           PT Long Term Goals - 01/29/17 1116      PT LONG TERM GOAL #1   Title pt will improve his FOTO from 60% limitation to </=40% limitation.    Baseline 17% limitation   Status Achieved     PT LONG TERM GOAL #2   Title pt will be able to improve his left shoulder flexion to >/= 160 degrees in order to improve functional mobility.    Baseline 162   Status Achieved     PT LONG TERM GOAL #3   Title Pt will improve his bilateral hamstring flexibility to >/= 70 degrees to improve functional mobility and gait.    Baseline shy of 70  deg but notable improvement   Status Partially Met     PT LONG TERM GOAL #4   Title Pt will demonstrate correct lifting body mechanics of 20# box off the floor.    Baseline able to demo   Status Achieved               Plan - 01/29/17 1135    Clinical Impression Statement Pt has met his goals at this time and verbalizes readiness for discharge. Verbalize comfort and understanding of exercises and continued care with postural and habitual changes. Pt was encouraged to contact us with any further questions.    PT Treatment/Interventions ADLs/Self Care Home Management;Cryotherapy;Electrical Stimulation;Iontophoresis 54m/ml Dexamethasone;Ultrasound;Neuromuscular re-education;Balance training;Therapeutic exercise;Therapeutic activities;Functional mobility training;Stair training;Gait training;Patient/family education;Manual techniques;Dry needling;Taping;Passive range of motion;Moist Heat   PT Home Exercise Plan cervical retraction, UT stretch, HS stretch, bridges, corner stretch, supine marching; prone scap retraction, figure 4 stretch; superman, row & ER at 93GHJ  abd; chops,open book   Consulted and Agree with Plan of Care Patient      Patient will benefit from skilled therapeutic intervention in order to improve the following deficits and impairments:  Postural dysfunction, Improper body mechanics, Decreased range of motion, Decreased mobility, Difficulty walking, Pain, Impaired UE functional use, Decreased strength, Decreased activity tolerance, Decreased endurance  Visit Diagnosis: Acute bilateral low back pain without sciatica  Pain in thoracic spine  Difficulty in walking, not elsewhere classified  Muscle weakness (generalized)  Acute pain of left shoulder     Problem List Patient Active Problem List   Diagnosis Date Noted  . [place] depressive disorder, recurrent episode ([place] 07/06/2014  . Social anxiety disorder 07/06/2014  . Multiple sclerosis ([place] 08/24/2013  . Nonspecific (abnormal) findings on radiological and other examination of skull and head 08/24/2013  . Disturbance of skin sensation 08/24/2013  . Other malaise and fatigue 08/24/2013  . Spasm of muscle 08/24/2013    PHYSICAL THERAPY DISCHARGE SUMMARY  Visits from Start of Care: 7  Current functional level related to goals / functional outcomes: See above   Remaining deficits: See above   Education / Equipment: Anatomy of condition, POC, HEP, exercise form/rationale  Plan: Patient agrees to discharge.  Patient goals were met. Patient is being discharged due to meeting the stated rehab goals.  ?????     Jessica C. Hightower PT, DPT 01/29/17 11:38 AM   [facility] [place] [address]Phone: [phone]  Fax:  [phone] Name: William HARMONMRN: [id number] of Birth: 7May 13, 1987

## 2017-01-31 ENCOUNTER — Ambulatory Visit: Payer: 59 | Admitting: Physical Therapy

## 2017-02-05 ENCOUNTER — Encounter: Payer: Self-pay | Admitting: Physical Therapy

## 2017-02-06 ENCOUNTER — Ambulatory Visit (INDEPENDENT_AMBULATORY_CARE_PROVIDER_SITE_OTHER): Payer: 59 | Admitting: Licensed Clinical Social Worker

## 2017-02-06 DIAGNOSIS — F411 Generalized anxiety disorder: Secondary | ICD-10-CM

## 2017-02-06 DIAGNOSIS — F331 Major depressive disorder, recurrent, moderate: Secondary | ICD-10-CM

## 2017-02-08 ENCOUNTER — Encounter (HOSPITAL_COMMUNITY): Payer: Self-pay | Admitting: Licensed Clinical Social Worker

## 2017-02-08 NOTE — Progress Notes (Signed)
   THERAPIST PROGRESS NOTE  Session Time: 9-10  Participation Level: Active  Behavioral Response: Casual and Fairly GroomedAlertAnxious and Euthymic  Type of Therapy: Individual Therapy  Treatment Goals addressed: Anxiety and Diagnosis: MDD  Interventions: CBT, Strength-based and Supportive  Summary: William Fitzgerald is a 31 y.o. male who presents with mixed anxiety and depression related to his worry about "acheiving his goals" and inability to focus for long periods of time. Pt appears more upbeat and less anxious that previous session.   Counselor and pt discuss impact of pt's anxiety on his job performance and social support. Pt admits his humor and sarcasm often "gets him taken less seriously than he wants to be". Pt and counselor deepen the discussion which eventually leads to pt's feelings towards his parents. Pt states he "wonders sometimes if his parents are proud of him". Pt states he "often feels uncomfortable being his true self w/ his parents'.   Pt admits he wants counselor to give him more advice and tell him "why he is here". Counselor reflects that counselors role is to aid pt in decision making, not make the decision for him.      Suicidal/Homicidal: Nowithout intent/plan  Therapist Response: Counselor used open questions and emotional reflection to help pt identify and gain insight into his presentation and feeling experience in session.   Plan: Return again in 2 weeks.  Diagnosis:    ICD-10-CM   1. Major depressive disorder, recurrent episode, moderate (HCC) F33.1   2. Generalized anxiety disorder F41.1        Margo Common, LCAS-A 02/08/2017

## 2017-02-25 ENCOUNTER — Ambulatory Visit (INDEPENDENT_AMBULATORY_CARE_PROVIDER_SITE_OTHER): Payer: 59 | Admitting: Licensed Clinical Social Worker

## 2017-02-25 DIAGNOSIS — F331 Major depressive disorder, recurrent, moderate: Secondary | ICD-10-CM | POA: Diagnosis not present

## 2017-02-25 DIAGNOSIS — F411 Generalized anxiety disorder: Secondary | ICD-10-CM

## 2017-02-28 ENCOUNTER — Encounter (HOSPITAL_COMMUNITY): Payer: Self-pay | Admitting: Licensed Clinical Social Worker

## 2017-02-28 NOTE — Progress Notes (Signed)
   THERAPIST PROGRESS NOTE  Session Time: 11-12pm  Participation Level: Active  Behavioral Response: Casual and Fairly GroomedAlertEuthymic  Type of Therapy: Individual Therapy  Treatment Goals addressed: Anxiety and Diagnosis: MDD GAD  Interventions: CBT, Strength-based and Reframing  Summary: William Fitzgerald is a 31 y.o. male who presents with hx of depression and anxiety related to social interaction and social comparison. Pt states that his past month has been "overall better" and he continues to see improvement in his mood and functioning. Pt reports on his goals of discussing his mental health w/ his mother and father, stating that he was unwilling to discuss this w/ them due to fear of their response. Counselor and pt discuss the challenges to that goal being met including pt's cognitions and behaviors that contribute to his negative thinking.  Suicidal/Homicidal: Nowithout intent/plan  Therapist Response: Counselor used open questions and active listening to help pt gain insight into his depression and how it impacts his functioning. Counselor encouraged pt to reframe his negative cognitions and rely on his strengths to help him manage his social anxiety and depression.   Plan: Return again in 2 weeks.  Diagnosis:    ICD-10-CM   1. Major depressive disorder, recurrent episode, moderate (HCC) F33.1   2. Generalized anxiety disorder F41.[place], LCAS-A 02/28/2017

## 2017-03-26 ENCOUNTER — Ambulatory Visit (INDEPENDENT_AMBULATORY_CARE_PROVIDER_SITE_OTHER): Payer: 59 | Admitting: Licensed Clinical Social Worker

## 2017-03-26 ENCOUNTER — Encounter (HOSPITAL_COMMUNITY): Payer: Self-pay | Admitting: Licensed Clinical Social Worker

## 2017-03-26 DIAGNOSIS — F331 Major depressive disorder, recurrent, moderate: Secondary | ICD-10-CM | POA: Diagnosis not present

## 2017-03-26 DIAGNOSIS — F411 Generalized anxiety disorder: Secondary | ICD-10-CM

## 2017-03-26 DIAGNOSIS — R45851 Suicidal ideations: Secondary | ICD-10-CM

## 2017-03-26 NOTE — Progress Notes (Signed)
   THERAPIST PROGRESS NOTE  Session Time: 10-11  Participation Level: Active  Behavioral Response: Casual and Fairly GroomedAlertAnxious and Irritable  Type of Therapy: Individual Therapy  Treatment Goals addressed: Anxiety  Interventions: CBT and Supportive  Summary: William Fitzgerald is a 31 y.o. male who presents with worry, and unstable self image related to his inability to focus and "not meet his goals". Pt reports an uptick in his inability to focus, which is evident in session. He is overly loquacious and sporadic in his subject matter. Pt becomes frequently distracted in session by objects in counselor's office. When asked direct questions such as "How do you define failure?", pt is unable to respond w/ direct answer and instead chooses to say "f-that, let's talk about something else". Pt is highly resistant to digging down deep into any issues that could help resolve his anxiety and/or depression.   Pt was encouraged to make an appointment to inquire about getting back on an Anti Depressant that helps w/ attention, such as Wellbutrin, and/or a stimulant for ADHD. Pt was encouraged to book an appointment w/ his primary care professional, [place], [place].    Pt admitted he does have thoughts of "thinking maybe he should end his life if it does not get better" but verbalized multiple reasons to not complete suicide including "his love of family, passion for cooking, and enjoying life most of the time. "  Counselor and pt identified 3 ways that pt's life has improved since starting talk therapy w/ this writer:  1. 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 Not taking feedback from supervisors at work personally 3. Making more friends  Suicidal/Homicidal: Automotive engineer Response: Counsleor encouraged pt to consider medications as an option for tx since CBT is helping him increase his effectiveness but he still remains anxious and unsure of  himself despite incorporating new techniques for distress tolerance.   Plan: Return again in 2 weeks.  Diagnosis:    ICD-10-CM   1. Major depressive disorder, recurrent episode, moderate (HCC) F33.1   2. Generalized anxiety disorder F41.1        Margo Common, LCAS-A 03/26/2017

## 2017-04-09 ENCOUNTER — Encounter (HOSPITAL_COMMUNITY): Payer: Self-pay | Admitting: Licensed Clinical Social Worker

## 2017-04-09 ENCOUNTER — Ambulatory Visit (INDEPENDENT_AMBULATORY_CARE_PROVIDER_SITE_OTHER): Payer: 59 | Admitting: Licensed Clinical Social Worker

## 2017-04-09 DIAGNOSIS — F411 Generalized anxiety disorder: Secondary | ICD-10-CM

## 2017-04-09 DIAGNOSIS — F331 Major depressive disorder, recurrent, moderate: Secondary | ICD-10-CM

## 2017-04-09 NOTE — Progress Notes (Signed)
   THERAPIST PROGRESS NOTE  Session Time: 10-11  Participation Level: Active  Behavioral Response: Casual and Fairly GroomedAlertEuthymic  Type of Therapy: Individual Therapy  Treatment Goals addressed: Anxiety and Coping  Interventions: CBT and Motivational Interviewing  Summary: William Fitzgerald is a 31 y.o. male who presents with depression, anxiety, social anxiety, and mild substance abuse. Pt is active and engaged in session. He presents as distractible throughout session. He reports he is "feeling a lot of stress lately" w/ upcoming college classes starting at [employer] Jan 7. Pt discusses his recent haircut (his first in 10 yrs) and talking to his mother about it. He states he recently signed up for classes and was unable to sign up for Art class since it has 50 people in it "and he couldn't handle a class that size". Counselor and pt discuss repercussions of classmates thinking pt is "dumb" using CBT "downward arrow technique". Counselor asked pt miracle question to which pt could not articulate how his life would be different in 2019 if he no longer struggled w/ anxiety/depression. Pt stated his isolating bx would "probably look the same" since he likes being alone.  Pt discussed his near daily marijuana use and "occasional alcohol abuse" when he does "not meet his goals". Pt states he realizes he is self medicating but can not imagine his life w/o said substances to help his anxiety.  Counselor and pt discussed option for pt to enter [employer] due to his increasingly low self worth, harsh criticism, and social anxiety. Pt was agreeable to this and met briefly w/ Velva Harman.   Suicidal/Homicidal: Nowithout intent/plan  Therapist Response: Counselor used MI and CBT to help pt resolve ambivalence and evoke his own change talk. Counselor assessed pt's level of functioning which appears to be significantly limited in social and educational outlets.  Plan: Return again in 2 weeks.  Diagnosis:   ICD-10-CM   1. Major depressive disorder, recurrent episode, moderate (HCC) F33.1   2. Generalized anxiety disorder F41.Frisco Swan, LCAS-A 04/09/2017

## 2017-04-11 ENCOUNTER — Encounter (INDEPENDENT_AMBULATORY_CARE_PROVIDER_SITE_OTHER): Payer: Self-pay | Admitting: Physician Assistant

## 2017-04-11 ENCOUNTER — Ambulatory Visit (INDEPENDENT_AMBULATORY_CARE_PROVIDER_SITE_OTHER): Payer: Self-pay | Admitting: Physician Assistant

## 2017-04-11 VITALS — BP 127/76 | HR 82 | Temp 98.1°F | Resp 18 | Ht 75.0 in | Wt 365.0 lb

## 2017-04-11 DIAGNOSIS — G4700 Insomnia, unspecified: Secondary | ICD-10-CM

## 2017-04-11 DIAGNOSIS — R41840 Attention and concentration deficit: Secondary | ICD-10-CM

## 2017-04-11 DIAGNOSIS — F418 Other specified anxiety disorders: Secondary | ICD-10-CM

## 2017-04-11 MED ORDER — CLONAZEPAM 1 MG PO TABS: 1.0000 mg | ORAL_TABLET | Freq: Every day | ORAL | 0 refills | Status: DC

## 2017-04-11 MED ORDER — ESCITALOPRAM OXALATE 20 MG PO TABS: 20.0000 mg | ORAL_TABLET | Freq: Every day | ORAL | 1 refills | Status: DC

## 2017-04-11 NOTE — Progress Notes (Signed)
Subjective:  Patient ID: William Fitzgerald, male    DOB: [birth year]  Age: 31 y.o. MRN: [id number]  CC: depression  HPI  William Fitzgerald is a RHD 31 y.o. male with a PMH of anxiety, depression, DVT, and MS presents to follow up on depression. Has been attending psychological counseling. Felt noticeably better when taking escitalopram but has run out approximately one month ago. Feels irritable, stressed, unable to focus, and unable to maintain sleep. Has not seen a psychiatrist yet. Denies suicidal intent or ideation.    Outpatient Medications Prior to Visit  Medication Sig Dispense Refill  . Cholecalciferol (VITAMIN D) 2000 units CAPS Take 2,000 Units by mouth daily.    . cyclobenzaprine (FLEXERIL) 10 MG tablet Take 1 tablet (10 mg total) by mouth 3 (three) times daily as needed for muscle spasms. 20 tablet 0  . diazepam (VALIUM) 5 MG tablet Take 1 tablet (5 mg total) by mouth every 12 (twelve) hours as needed for anxiety. 14 tablet 0  . escitalopram (LEXAPRO) 20 MG tablet Take 1 tablet (20 mg total) by mouth daily. 30 tablet 2  . ibuprofen (ADVIL,MOTRIN) 600 MG tablet Take 1 tablet (600 mg total) by mouth every 6 (six) hours as needed. 30 tablet 0  . Ocrelizumab (OCREVUS IV) Inject into the vein. Every 6 months    . hydrocortisone 2.5 % lotion Apply topically 2 (two) times daily. (Patient not taking: Reported on 12/17/2016) 59 mL 0   No facility-administered medications prior to visit.      ROS Review of Systems  Constitutional: Negative for chills, fever and malaise/fatigue.  Eyes: Negative for blurred vision.  Respiratory: Negative for shortness of breath.   Cardiovascular: Negative for chest pain and palpitations.  Gastrointestinal: Negative for abdominal pain and nausea.  Genitourinary: Negative for dysuria and hematuria.  Musculoskeletal: Negative for joint pain and myalgias.  Skin: Negative for rash.  Neurological: Negative for tingling and headaches.   Psychiatric/Behavioral: Negative for depression and suicidal ideas. The patient is not nervous/anxious.        Irritable. Poor concentration. Poor sleep pattern.    Objective:  BP 127/76 (BP Location: Left Arm, Patient Position: Sitting, Cuff Size: Large)   Pulse 82   Temp 98.1 F (36.7 C) (Oral)   Resp 18   Ht 6\' 3"  (1.905 m)   Wt (!) 365 lb (165.6 kg)   SpO2 99%   BMI 45.62 kg/m   BP/Weight 04/11/2017 12/24/2016 12/17/2016  Systolic BP 127 129 121  Diastolic BP 76 82 84  Wt. (Lbs) 365 363.8 360.2  BMI 45.62 42.04 41.63  Some encounter information is confidential and restricted. Go to Review Flowsheets activity to see all data.      Physical Exam  Constitutional: He is oriented to person, place, and time.  Well developed, overweight, NAD, polite  HENT:  Head: Normocephalic and atraumatic.  Eyes: No scleral icterus.  Neck: Normal range of motion. Neck supple. No thyromegaly present.  Pulmonary/Chest: Effort normal.  Musculoskeletal: He exhibits no edema.  Neurological: He is alert and oriented to person, place, and time. No cranial nerve deficit. Coordination normal.  Skin: Skin is warm and dry. No rash noted. No erythema. No pallor.  Psychiatric: He has a normal mood and affect. His behavior is normal.  Thought content normal but interrupted. Pt seems to have difficulty keeping a linear thought process and finding the right continuity of wording, however, he was never tangential during this encounter.  Vitals reviewed.  Assessment & Plan:   * Advised patient to go to [facility] to get his influenza vaccine.   1. Depression with anxiety - Restart escitalopram (LEXAPRO) 20 MG tablet; Take 1 tablet (20 mg total) by mouth daily.  Dispense: 90 tablet; Refill: 1 - Begin clonazePAM (KLONOPIN) 1 MG tablet; Take 1 tablet (1 mg total) by mouth at bedtime.  Dispense: 30 tablet; Refill: 0 - Sent staff message to Mr. Ernestine Conrad, pt's counselor, and asked if patient has been able to  see psychiatrist. I would make another referral to psychiatrist if needed.   2. Insomnia, unspecified type - Begin clonazePAM (KLONOPIN) 1 MG tablet; Take 1 tablet (1 mg total) by mouth at bedtime.  Dispense: 30 tablet; Refill: 0  3. Poor concentration - Restart escitalopram (LEXAPRO) 20 MG tablet; Take 1 tablet (20 mg total) by mouth daily.  Dispense: 90 tablet; Refill: 1   Meds ordered this encounter  Medications  . escitalopram (LEXAPRO) 20 MG tablet    Sig: Take 1 tablet (20 mg total) by mouth daily.    Dispense:  90 tablet    Refill:  1    Order Specific Question:   Supervising Provider    Answer:   Quentin Angst [id number]  . clonazePAM (KLONOPIN) 1 MG tablet    Sig: Take 1 tablet (1 mg total) by mouth at bedtime.    Dispense:  30 tablet    Refill:  0    Order Specific Question:   Supervising Provider    Answer:   Quentin Angst [[id number]]    Follow-up: Return in about 6 weeks (around 05/23/2017) for Depression.   Loletta Specter PA

## 2017-04-11 NOTE — Patient Instructions (Signed)
Community Resources  Advocacy/Legal Legal Aid [place]:  1-866-219-5262  /  336-272-0148  Family Justice Center:  336-641-7233  [facility] [place] 24-hr Crisis line:  336-273-7273  Women's Resource Center, [place]:  336-275-6090  Court Watch (custody):  336-275-2346  [place] [facility]:   336-279-9299    Baby & Breastfeeding Car Seat Inspection @ Various [place] Fire Depts.- call 336-373-2177  [place] [facility]  336-832-6860  [facility] 336-878-6712  WIC: 336-641-3663 ([place]);  336-641-7571 ([place])  La Leche League:  1-877-452-5321   Childcare [place] [facility]: 336-369-5097 ([place]) / 336-887-8224 ([place])  - Child Care Resources/ Referrals/ Scholarships  - Head Start/ Early Head Start (call or apply online)  [place] DHHS: [place] Pre-K :  1-800-859-0829 / 336-274-5437   Employment / Job Search [facility] [place]: 336-275-6090 / [address]  [place] [facility] Career Center (JobLink): 336-373-5922 ([place]) / 336-882-4141 ([place])  [facility] Community Resource/ Career Center: 336-275-9801 / 336-282-7307  [facility] Job & Career Center: 336-373-3764  DHHS Work First: 336-641-3447 ([place]) / 336-641-3447 ([place])  StepUp [facility] [place]:  336-676-5871   Financial Assistance [place] [facility]:  336-553-2657  Salvation Army: 336-235-0368  [place] Network (furniture):  336-370-4002  [place] [facility] Hands: 336-373-4264  Low Income Energy Assistance  336-641-3000   Food Assistance DHHS- SNAP/ Food Stamps: 336-641-4588  WIC: [place]- 336-641-3663 ;  [place] 336-641-7571  Little Green Book- Free Meals  Little Blue Book- Free Food Pantries  During the summer, text "FOOD" to 877877   General Health / Clinics (Adults) Orange Card (for Adults) through [place] [facility]: (336) 895-4900  [facility]:   336-832-8035  [facility]:   336-832-4444  Health Department:  336-641-3245  [place]:  336-415-3877 / 336-641-2100  Planned Parenthood of [place]:   336-373-0678  [facility]:   336-334-4822 x 50251   Housing [place] Housing Coalition:   336-691-9521  [place] Housing Authority:  336-275-8501  Affordable Housing Managemnt:  336-273-0568   Immigrant/ Refugee Center for [place]ians (UNCG):  336-256-1065  Faith Action International [facility]:  336-379-0037  [facility] [employer]riv[employer] Institute:  336-937-4701  Church World Services:  336-617-0381  African Services Coalition:  336-574-2677   LGBTQ YouthSAFE  www.youthsafe[place].org  PFLAG  336-541-6754 / info@[place].org  The Trevor Project:  1-866-488-7386   Mental Health/ Substance Use [facility]  336-387-6161  [facility]:  336-832-9700 or 1-800-711-2635  [facility]'s Circle of Care:  336-271-5888  [facility]:  336-294-1349  [facility] Care Services:  336-542-2884  [facility] (walk-ins)  336-676-6840 / [address]  [place]:  800-449-1287  Alcoholics Anonymous:  336-854-4278  Narcotics Anonymous:  800-365-1036  Quit Smoking Hotline:  800-QUIT-NOW (800-784-8669)   Parenting Children's Home Society:  800-632-1400  [place]: Education Center & Support Groups:  336-832-6682  YWCA: 336-273-3461  UNCG: Bringing Out the Best:  336-334-3120               Thriving at Three (Hispanic families): 336-256-1066  Healthy Start ([facility]):  336-387-6161 x2288  Parents as Teachers:  336-691-0024  [place] Child [facility]- Learning Together (Immigrants): 336-369-5001   Poison Control 800-222-1222  Sports & Recreation [employer] Open Doors Application: ymcanwnc.org/join/open-doors-financial-assistance/  City of [place] Recreation Centers: http://www.[place].gov/index.aspx?page=3615   Special Needs Family Support Network:  336-832-6507  Autism Society of :   336-333-0197 x1402 or x1412 /  800-785-1035  TEACCH [place]:  336-334-5773     ARC of [place]:  732-881-4490  Children's Developmental Service Agency (CDSA):  804 498 1692  [facility] (Care Coordination for Children):  (915)040-6633   Transportation Medicaid Transportation: 307-403-9152 to apply  [place] Authority: 215 544 2985 (reduced-fare bus ID to Medicaid/ Medicare/ Orange Card)  SCAT Paratransit services: Eligible riders only, call (502)830-3750 for application   Tutoring/Mentoring Black Child Development Institute: 631-136-7487  [facility] Brothers/ Big Sisters: 818-775-7041 442-201-0987 ([place])  ACES through child's school: (641) 035-8788  [employer] Achievers: contact your local [place] Dys Mentor Program: (225)044-3845    Major Depressive Disorder, Adult Major depressive disorder (MDD) is a mental health condition. MDD often makes you feel sad, hopeless, or helpless. MDD can also cause symptoms in your body. MDD can affect your:  Work.  School.  Relationships.  Other normal activities.  MDD can range from mild to very bad. It may occur once (single episode MDD). It can also occur many times (recurrent MDD). The main symptoms of MDD often include:  Feeling sad, depressed, or irritable most of the time.  Loss of interest.  MDD symptoms also include:  Sleeping too much or too little.  Eating too much or too little.  A change in your weight.  Feeling tired (fatigue) or having low energy.  Feeling worthless.  Feeling guilty.  Trouble making decisions.  Trouble thinking clearly.  Thoughts of suicide or harming others.  Feeling weak.  Feeling agitated.  Keeping yourself from being around other people (isolation).  Follow these instructions at home: Activity  Do these things as told by your doctor: ? Go back to your normal activities. ? Exercise regularly. ? Spend time outdoors. Alcohol  Talk with your doctor about how alcohol can affect your antidepressant medicines.  Do not drink alcohol. Or, limit how much alcohol you  drink. ? This means no more than 1 drink a day for nonpregnant women and 2 drinks a day for men. One drink equals one of these:  12 oz of beer.  5 oz of wine.  1 oz of hard liquor. General instructions  Take over-the-counter and prescription medicines only as told by your doctor.  Eat a healthy diet.  Get plenty of sleep.  Find activities that you enjoy. Make time to do them.  Think about joining a support group. Your doctor may be able to suggest a group for you.  Keep all follow-up visits as told by your doctor. This is important. Where to find more information:  The First American on Mental Illness: ? www.nami.org  U.S. [employer] of Mental Health: ? http://www.maynard.net/  National Suicide Prevention Lifeline: ? (915) 299-5130. This is free, 24-hour help. Contact a doctor if:  Your symptoms get worse.  You have new symptoms. Get help right away if:  You self-harm.  You see, hear, taste, smell, or feel things that are not present (hallucinate). If you ever feel like you may hurt yourself or others, or have thoughts about taking your own life, get help right away. You can go to your nearest emergency department or call:  Your local emergency services (911 in the U.S.).  A suicide crisis helpline, such as the National Suicide Prevention Lifeline: ? 647-816-5022. This is open 24 hours a day.  This information is not intended to replace advice given to you by your health care provider. Make sure you discuss any questions you have with your health care provider. Document Released: 04/04/2015 Document Revised: 01/08/2016 Document Reviewed: 01/08/2016 Elsevier Interactive Patient Education  2017 [employer].

## 2017-04-23 ENCOUNTER — Ambulatory Visit (INDEPENDENT_AMBULATORY_CARE_PROVIDER_SITE_OTHER): Payer: 59 | Admitting: Licensed Clinical Social Worker

## 2017-04-23 ENCOUNTER — Encounter (HOSPITAL_COMMUNITY): Payer: Self-pay | Admitting: Licensed Clinical Social Worker

## 2017-04-23 DIAGNOSIS — F331 Major depressive disorder, recurrent, moderate: Secondary | ICD-10-CM

## 2017-04-23 DIAGNOSIS — F411 Generalized anxiety disorder: Secondary | ICD-10-CM | POA: Diagnosis not present

## 2017-04-23 NOTE — Progress Notes (Signed)
   THERAPIST PROGRESS NOTE  Session Time: 10-11  Participation Level: Active  Behavioral Response: Casual and Fairly GroomedAlertAnxious and Irritable  Type of Therapy: Individual Therapy  Treatment Goals addressed: Anxiety and Diagnosis: MDD  Interventions: CBT and Motivational Interviewing  Summary: William RosenthalJustin D Fitzgerald is a 31 y.o. male who presents with significant anxiety and depression. He presents as more reserved and less upbeat than previous sessions and admits he is feeling nervous about his upcoming semester in college. Pt discusses his goals of "working to create better goals for myself; gaining more confidence". Pt presents as disorganzied in this thought process and frequently becomes distracted in session by changing the subject abruptly or staring off for 5-10 seconds w/o response.  Pt admits he feels very indecissive and this is impacting his ability to concentrate and complete his goals. Counselor spends time reflecting on pt's bx of "doing the opposite of what I feel I really need to. For instance today I may go drink 2 margaritas instead of going home to relax like I should on my day off". Pt admits his use of substances is problematic but also admits he "does not want to stop using weed or alcohol" since they "ultimately help me more".   Counselor helps pt write down as many goals as he can think of for 2019. Pt states he wants to travel more, finish his semester in school at [employer], lose 15 lbs, and get mentally healthy. Pt states that "he knows he is the one that needs to do the work of meeting his goals". Counselor helps pt identify smaller, measurable goals such as "buying his books for classes, and researching counseling/tutoring options as school".  Suicidal/Homicidal: Nowithout intent/plan  Therapist Response: Counselor used CBT style Socratic questioning to help pt gain insight into his self-defeating bx. Counselor helps pt write a list of goals for 2019 to help pt  maintain focus. Counselor encouraged pt to start doing bx that help him meet his goals each day.  Plan: Return again in 2 weeks.  Diagnosis:    ICD-10-CM   1. Major depressive disorder, recurrent episode, moderate (HCC) F33.1   2. Generalized anxiety disorder F41.1       Margo CommonWesley E Swan, LCAS-A 04/23/2017

## 2017-05-15 ENCOUNTER — Ambulatory Visit (INDEPENDENT_AMBULATORY_CARE_PROVIDER_SITE_OTHER): Payer: 59 | Admitting: Licensed Clinical Social Worker

## 2017-05-15 DIAGNOSIS — F411 Generalized anxiety disorder: Secondary | ICD-10-CM | POA: Diagnosis not present

## 2017-05-15 DIAGNOSIS — F331 Major depressive disorder, recurrent, moderate: Secondary | ICD-10-CM

## 2017-05-17 ENCOUNTER — Encounter (HOSPITAL_COMMUNITY): Payer: Self-pay | Admitting: Licensed Clinical Social Worker

## 2017-05-17 NOTE — Progress Notes (Signed)
   THERAPIST PROGRESS NOTE  Session Time: 3-4  Participation Level: Active  Behavioral Response: Casual and Fairly GroomedAlertEuthymic  Type of Therapy: Individual Therapy  Treatment Goals addressed: Diagnosis: MDD  Interventions: CBT, Supportive and Reframing  Summary: William Fitzgerald is a 32 y.o. male who presents with anxiety and depression. He reports he has started classes at [employer] and is feeling good about his semester though he is nervous since he has been out of school for some years. He states his negative self talk is decreased and denies any recent SI.   Pt discusses his thoughts of "wondering what others will think of him when he dies" and asks counselor this question.   Counselor and pt discuss pt's upcoming appointment w/ Dr. Vanetta Shawl to assess pt's frequent distractibility, inattention, and lack of focus at school, work, and socially.  Counselor challenges pt's negative automatic thought that if he "doesn't know what others at a party are talking about, then he looks weird".   Suicidal/Homicidal: Nowithout intent/plan  Therapist Response: Counselor used CBT, socractic questioning, and supportive encouragement. Counselor assessed pt's level of functioning and SI. Pt continues to show progress and strong therapeutic rapport.   Plan: Return again in 2 weeks.  Diagnosis:    ICD-10-CM   1. Major depressive disorder, recurrent episode, moderate (HCC) F33.1   2. Generalized anxiety disorder F41.1          Margo Common, LCAS-A 05/17/2017

## 2017-05-28 ENCOUNTER — Ambulatory Visit (HOSPITAL_COMMUNITY): Payer: Self-pay | Admitting: Psychiatry

## 2017-05-29 NOTE — Progress Notes (Signed)
Psychiatric Initial Adult Assessment   Patient Identification: William Fitzgerald MRN:  [id number] Date of Evaluation:  06/03/2017 Referral Source: Self Chief Complaint:   Chief Complaint    Depression; Psychiatric Evaluation    "I'm everywhere" Visit Diagnosis:    ICD-10-CM   1. Moderate episode of recurrent major depressive disorder (HCC) F33.1   2. Social anxiety disorder F40.10     History of Present Illness:   William Fitzgerald is a 32 y.o. year old male with a history of depression, anxiety, multiple sclerosis (diagnosed in 2015), who is referred for depression.   Patient states that he is here for inattention and difficulty in concentration.  He notices it more in school.  He states that although that he is able to stay focused on certain tasks like cooking, he cannot follow through other things. He is easily distracted. He has been able to keep appointment. 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 He would also "blur out words" which may emotionally be hurtful to other people. He tends to "worry too much" and tends to be negative to self.  He was diagnosed MS and is getting infusion twice a year. He complains of mild delay in speech, stuttering, which he attributes to MS. He sees his parents, grandparents daily, although he may not talk in details about how he has been feeling. He vaguely reports relationship issues.   He feels depressed.  He has low energy and has anhedonia.  He has fair appetite.  He denies SI.  He has insomnia.  He feels anxious and tense at times.  He has had panic attacks when he went to traffic court.  He is not currently on clonazepam. He reports history of drinking a fifth of beers, wines, in 2013. He "just stopped purchasing" and currently drinks only a sample of wine at work. He uses marijuana weekly for  depression and anxiety.   Per PMP Clonazepam last filled on 04/11/2017 I have utilized the [place] Controlled Substances Reporting System (PMP AWARxE) to confirm adherence regarding the patient's medication. My review reveals appropriate prescription fills.   Associated Signs/Symptoms: Depression Symptoms:  depressed mood, anhedonia, insomnia, fatigue, difficulty concentrating, impaired memory, anxiety, panic attacks, (Hypo) Manic Symptoms:  denies decreased need for sleep, euphoria Anxiety Symptoms:  Excessive Worry, Panic Symptoms, Psychotic Symptoms:  denies AH, VH + paranoia (people in general might do something to him ) PTSD Symptoms: NA  Past Psychiatric History:  Outpatient: Dimas Chyle, LCSW for therapy Psychiatry admission: denies  Previous suicide attempt: denies  Past trials of medication: lexapro, clonazepam,  History of violence: denies   Previous Psychotropic Medications: Yes   Substance Abuse History in the last 12 months:  No.  Consequences of Substance Abuse: NA  Past Medical History:  Past Medical History:  Diagnosis Date  . Anxiety   . Depression   . DVT (deep venous thrombosis) (HCC) 2014  . MS (multiple sclerosis) (HCC)   . MS (multiple sclerosis) (HCC)     Past Surgical History:  Procedure Laterality Date  . DENTAL SURGERY  2016  . VASCULAR SURGERY Right 06/2013    Family Psychiatric History:  Cousin- schizophrenia,    Family History:  Family History  Problem Relation Age of Onset  . Seizures Other   . Lung cancer Maternal Aunt   .  Diabetes Maternal Grandfather   . Lung cancer Maternal Aunt   . Schizophrenia Cousin   . ADD / ADHD Cousin     Social History:   Social History   Socioeconomic History  . Marital status: Single    Spouse name: None  . Number of children: 0  . Years of education: 12th  . Highest education level: None  Social Needs  . Financial resource strain: None  . Food insecurity - worry: None  . Food  insecurity - inability: None  . Transportation needs - medical: None  . Transportation needs - non-medical: None  Occupational History    Employer: [employer]     Comment: [employer]  Tobacco Use  . Smoking status: Former Smoker    Types: Cigarettes  . Smokeless tobacco: Never Used  Substance and Sexual Activity  . Alcohol use: No    Alcohol/week: 0.0 oz  . Drug use: No    Comment: quit marijuana 2014  . Sexual activity: Yes    Birth control/protection: Condom  Other Topics Concern  . None  Social History Narrative   Patient lives at home with family.   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 Gadolinium Derivatives Nausea Only    Pt became nauseous after contrast    Metabolic Disorder Labs: No results found for: HGBA1C, MPG No results found for: PROLACTIN No results found for: CHOL, TRIG, HDL, CHOLHDL, VLDL, LDLCALC   Current Medications: Current Outpatient Medications  Medication Sig Dispense Refill  . Cholecalciferol (VITAMIN D) 2000 units CAPS Take 2,000 Units by mouth daily.    . cyclobenzaprine (FLEXERIL) 10 MG tablet Take 1 tablet (10 mg total) by mouth 3 (three) times daily as needed for muscle spasms. 20 tablet 0  . escitalopram (LEXAPRO) 20 MG tablet Take 1 tablet (20 mg total) by mouth daily. 90 tablet 1  . hydrocortisone 2.5 % lotion Apply topically 2 (two) times daily. 59 mL 0  . ibuprofen (ADVIL,MOTRIN) 600 MG tablet Take 1 tablet (600 mg total) by mouth every 6 (six) hours as needed. 30 tablet 0  . Ocrelizumab (OCREVUS IV) Inject into the vein. Every 6 months    . buPROPion (WELLBUTRIN XL) 150 MG 24 hr tablet Take 1 tablet (150 mg total) by mouth daily. 30 tablet 0  . clonazePAM (KLONOPIN) 1 MG tablet Take 1 tablet (1 mg total) by mouth at bedtime. (Patient  not taking: Reported on 06/03/2017) 30 tablet 0  . diazepam (VALIUM) 5 MG tablet Take 1 tablet (5 mg total) by mouth every 12 (twelve) hours as needed for anxiety. (Patient not taking: Reported on 06/03/2017) 14 tablet 0   No current facility-administered medications for this visit.     Neurologic: Headache: No Seizure: No Paresthesias:Yes  Musculoskeletal: Strength & Muscle Tone: within normal limits Gait & Station: normal Patient leans: N/A  Psychiatric Specialty Exam: Review of Systems  Musculoskeletal: Positive for myalgias.  Psychiatric/Behavioral: Positive for depression and memory loss. Negative for hallucinations, substance abuse and suicidal ideas. The patient is nervous/anxious and has insomnia.     Blood pressure 136/74, pulse 72, height 6\' 3"  (1.905 m), weight (!) 350 lb (158.8 kg), SpO2 99 %.Body mass index is 43.75 kg/m.  General Appearance: Fairly Groomed  Eye Contact:  Good  Speech:  slightly slurred  Volume:  Normal  Mood:  Anxious  Affect:  Appropriate, Congruent, Restricted and down  Thought Process:  Coherent  Orientation:  Full (Time, Place, and Person)  Thought Content:  vague paranoia that people in general might do something to him  Suicidal Thoughts:  No  Homicidal Thoughts:  No  Memory:  Immediate;   Poor  Judgement:  Good  Insight:  Present  Psychomotor Activity:  Normal  Concentration:  Concentration: Poor and Attention Span: Poor  Recall:  Fair  Fund of Knowledge:Fair  Language: Good  Akathisia:  No  Handed:  Right  AIMS (if indicated):  N/A  Assets:  Communication Skills Desire for Improvement  ADL's:  Intact  Cognition: WNL  Sleep:  poor   Assessment William Fitzgerald is a 32 y.o. year old male with a history of depression, anxiety, multiple sclerosis (diagnosed in 2015), who is referred for depression and inattention.   # MDD, moderate, recurrent without psychotic features Patient endorses neurovegetative symptoms with anxiety.   Psychosocial stressors including MS, school and relationship issues.  Will add Wellbutrin to target neurovegetative symptoms; the hope is that this medication will be also helpful for his inattention.  Discussed potential side effect of headache and worsening anxiety.  Patient has no known history of seizure.  Will continue Lexapro for depression; he is advised to take it every day.  He will greatly benefit from CBT; he is encouraged to continue to see his therapist.  # r/o ADHD # r/o neurocognitive deficit Exam is notable for inattention, and impairment in recent recall. It is likely multifactorial given MS, marijuana use and active mood symptoms as described above. No known childhood history of ADHD. Will first prioritize treatment for depression/anxiety. Will consider [facility] evaluation/neuropsych referral in the future if his symptoms persist despite treatment for mood disorder.   # Marijuana use  Patient reports occasional marijuana use and is at pre contemplative stage for sobriety. Will continue motivational interview.   Plan 1. Continue lexapro 20 mg daily  2. Start Wellbutrin 150 mg daily  3. Return to clinic in one month for 30 mins - obtain TSH at the next visit  The patient demonstrates the following risk factors for suicide: Chronic risk factors for suicide include: psychiatric disorder of depression. Acute risk factors for suicide include: N/A. Protective factors for this patient include: positive social support, coping skills and hope for the future. Considering these factors, the overall suicide risk at this point appears to be low. Patient is appropriate for outpatient follow up.   Treatment Plan Summary: Plan as above   Neysa Hotter, MD 1/28/20193:01 PM

## 2017-06-03 ENCOUNTER — Encounter (HOSPITAL_COMMUNITY): Payer: Self-pay | Admitting: Psychiatry

## 2017-06-03 ENCOUNTER — Ambulatory Visit (INDEPENDENT_AMBULATORY_CARE_PROVIDER_SITE_OTHER): Payer: 59 | Admitting: Psychiatry

## 2017-06-03 VITALS — BP 136/74 | HR 72 | Ht 75.0 in | Wt 350.0 lb

## 2017-06-03 DIAGNOSIS — F410 Panic disorder [episodic paroxysmal anxiety] without agoraphobia: Secondary | ICD-10-CM | POA: Diagnosis not present

## 2017-06-03 DIAGNOSIS — Z818 Family history of other mental and behavioral disorders: Secondary | ICD-10-CM | POA: Diagnosis not present

## 2017-06-03 DIAGNOSIS — F4010 Social phobia, unspecified: Secondary | ICD-10-CM | POA: Diagnosis not present

## 2017-06-03 DIAGNOSIS — G4700 Insomnia, unspecified: Secondary | ICD-10-CM | POA: Diagnosis not present

## 2017-06-03 DIAGNOSIS — R450 Nervousness: Secondary | ICD-10-CM

## 2017-06-03 DIAGNOSIS — Z87891 Personal history of nicotine dependence: Secondary | ICD-10-CM

## 2017-06-03 DIAGNOSIS — F1290 Cannabis use, unspecified, uncomplicated: Secondary | ICD-10-CM | POA: Diagnosis not present

## 2017-06-03 DIAGNOSIS — R413 Other amnesia: Secondary | ICD-10-CM | POA: Diagnosis not present

## 2017-06-03 DIAGNOSIS — F419 Anxiety disorder, unspecified: Secondary | ICD-10-CM | POA: Diagnosis not present

## 2017-06-03 DIAGNOSIS — F331 Major depressive disorder, recurrent, moderate: Secondary | ICD-10-CM | POA: Diagnosis not present

## 2017-06-03 MED ORDER — BUPROPION HCL ER (XL) 150 MG PO TB24: 150.0000 mg | ORAL_TABLET | Freq: Every day | ORAL | 0 refills | Status: DC

## 2017-06-03 NOTE — Patient Instructions (Signed)
1. Continue lexapro 20 mg daily  2. Start Wellbutrin 150 mg daily  3. Return to clinic in one month for 30 mins

## 2017-06-12 ENCOUNTER — Ambulatory Visit (INDEPENDENT_AMBULATORY_CARE_PROVIDER_SITE_OTHER): Payer: 59 | Admitting: Licensed Clinical Social Worker

## 2017-06-12 DIAGNOSIS — F331 Major depressive disorder, recurrent, moderate: Secondary | ICD-10-CM | POA: Diagnosis not present

## 2017-06-12 DIAGNOSIS — F411 Generalized anxiety disorder: Secondary | ICD-10-CM | POA: Diagnosis not present

## 2017-06-13 ENCOUNTER — Encounter (HOSPITAL_COMMUNITY): Payer: Self-pay | Admitting: Licensed Clinical Social Worker

## 2017-06-13 NOTE — Progress Notes (Signed)
   THERAPIST PROGRESS NOTE  Session Time: 3-4  Participation Level: Active  Behavioral Response: Casual and Fairly GroomedAlertEuthymic and Irritable  Type of Therapy: Individual Therapy  Treatment Goals addressed: Anxiety and Diagnosis: MDD  Interventions: CBT and Supportive  Summary: William Fitzgerald is a 32 y.o. male who presents with anxiety and disturbance of mood. He states his mood has "gotten more depressed since starting Wellbutrin 1.5 weeks ago". Pt was encouraged to continue medication as px for 1 more week before ruling it out as ineffective. Pt was vocally distressed but agreed he would take it for 1 more week and reassess his mood then.   Pt was asked about his relationship to his siblings and his childhood. He stated he mostly kept to himself and he isolated. Pt admitted his older brother was "more popular than he was" and this impacted his self esteem. Pt continues to struggle w/ avoidant and perfectionistic thinking.   Suicidal/Homicidal: Nowithout intent/plan  Therapist Response: Counselor used CBT style questions to help pt gain insight into his obsessive negative thinking. Counselor used mild psychoeducation to encourage pt to stay on his medications as px for now.  Plan: Return again in 2 weeks.  Diagnosis:    ICD-10-CM   1. Moderate episode of recurrent major depressive disorder (HCC) F33.1   2. Generalized anxiety disorder F41.1       Margo Common, LCAS-A 06/13/2017

## 2017-06-26 ENCOUNTER — Ambulatory Visit: Payer: 59 | Admitting: Diagnostic Neuroimaging

## 2017-07-02 NOTE — Progress Notes (Signed)
BH MD/PA/NP OP Progress Note  07/08/2017 3:44 PM William Fitzgerald  MRN:  [id number]  Chief Complaint:  Chief Complaint    Follow-up; Depression     HPI:  Patient presents for follow up appointment for depression. He states that he discontinued Wellbutrin as it causes him fatigue and some eye twitching. He states that his father deceased few weeks ago and it has been difficult for him. He has been feeling irritable and complains of inattention. He feels easily distracted, referring that he has difficulty to keep topic in conversation. It causes him more anxiety when he is with other people as he is concerned that people might judge him. He feels that it is a "mental game" which he needs to control. He hopes to "smile" more to other people if he is not depressed. He has fair sleep. He feels fatigue. He likes to go out and take a walk although he reports mild anhedonia. He denies SI. He feels less anxious, stating that he feels more relaxed during spring holiday. He denies panic attacks. He has difficulty in sustaining attention or prioritizing tasks. Never diagnosed with ADHD while he believes he has these symptoms since high school (before he is diagnosed with MS.).    Visit Diagnosis:    ICD-10-CM   1. Moderate episode of recurrent major depressive disorder (HCC) F33.1   2. Attention deficit hyperactivity disorder (ADHD), unspecified ADHD type F90.9 Ambulatory referral to Psychology    Past Psychiatric History:  I have reviewed the patient's psychiatry history in detail and updated the patient record. Outpatient: Dimas Chyle, LCSW for therapy Psychiatry admission: denies  Previous suicide attempt: denies  Past trials of medication: lexapro, clonazepam,  History of violence: denies   Past Medical History:  Past Medical History:  Diagnosis Date  . Anxiety   . Depression   . DVT (deep venous thrombosis) (HCC) 2014  . MS (multiple sclerosis) (HCC)   . MS (multiple sclerosis) (HCC)      Past Surgical History:  Procedure Laterality Date  . DENTAL SURGERY  2016  . VASCULAR SURGERY Right 06/2013    Family Psychiatric History:  I have reviewed the patient's family history in detail and updated the patient record.  Family History:  Family History  Problem Relation Age of Onset  . Seizures Other   . Lung cancer Maternal Aunt   . Diabetes Maternal Grandfather   . Lung cancer Maternal Aunt   . Schizophrenia Cousin   . ADD / ADHD Cousin     Social History:  Social History   Socioeconomic History  . Marital status: Single    Spouse name: Not on file  . Number of children: 0  . Years of education: 12th  . Highest education level: Not on file  Social Needs  . Financial resource strain: Not on file  . Food insecurity - worry: Not on file  . Food insecurity - inability: Not on file  . Transportation needs - medical: Not on file  . Transportation needs - non-medical: Not on file  Occupational History    Employer: [employer]     Comment: [employer]  Tobacco Use  . Smoking status: Former Smoker    Types: Cigarettes  . Smokeless tobacco: Never Used  Substance and Sexual Activity  . Alcohol use: No    Alcohol/week: 0.0 oz  . Drug use: No    Comment: quit marijuana 2014  . Sexual activity: Yes    Birth control/protection: Condom  Other  Topics Concern  . Not on file  Social History Narrative   Patient lives at home with family.   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 Gadolinium Derivatives Nausea Only    Pt became nauseous after contrast    Metabolic Disorder Labs: No results found for: HGBA1C, MPG No results found for: PROLACTIN No results found for: CHOL, TRIG, HDL, CHOLHDL, VLDL, LDLCALC No results found for: TSH  Therapeutic Level Labs: No results found for: LITHIUM No  results found for: VALPROATE No components found for:  CBMZ  Current Medications: Current Outpatient Medications  Medication Sig Dispense Refill  . Cholecalciferol (VITAMIN D) 2000 units CAPS Take 2,000 Units by mouth daily.    . cyclobenzaprine (FLEXERIL) 10 MG tablet Take 1 tablet (10 mg total) by mouth 3 (three) times daily as needed for muscle spasms. 20 tablet 0  . escitalopram (LEXAPRO) 20 MG tablet Take 1 tablet (20 mg total) by mouth daily. 90 tablet 1  . hydrocortisone 2.5 % lotion Apply topically 2 (two) times daily. 59 mL 0  . ibuprofen (ADVIL,MOTRIN) 600 MG tablet Take 1 tablet (600 mg total) by mouth every 6 (six) hours as needed. 30 tablet 0  . Ocrelizumab (OCREVUS IV) Inject into the vein. Every 6 months    . clonazePAM (KLONOPIN) 1 MG tablet Take 1 tablet (1 mg total) by mouth at bedtime. (Patient not taking: Reported on 07/08/2017) 30 tablet 0  . diazepam (VALIUM) 5 MG tablet Take 1 tablet (5 mg total) by mouth every 12 (twelve) hours as needed for anxiety. (Patient not taking: Reported on 07/08/2017) 14 tablet 0   No current facility-administered medications for this visit.      Musculoskeletal: Strength & Muscle Tone: within normal limits Gait & Station: normal Patient leans: N/A  Psychiatric Specialty Exam: Review of Systems  Psychiatric/Behavioral: Positive for depression. Negative for hallucinations, memory loss, substance abuse and suicidal ideas. The patient is nervous/anxious. The patient does not have insomnia.   All other systems reviewed and are negative.   Blood pressure 137/80, pulse 68, height 6\' 3"  (1.905 m), weight (!) 354 lb (160.6 kg), SpO2 99 %.Body mass index is 44.25 kg/m.  General Appearance: Fairly Groomed  Eye Contact:  Good  Speech:  Clear and Coherent  Volume:  Normal  Mood:  Depressed  Affect:  Appropriate, Congruent and Restricted  Thought Process:  Coherent and Goal Directed  Orientation:  Full (Time, Place, and Person)  Thought  Content: Logical   Suicidal Thoughts:  No  Homicidal Thoughts:  No  Memory:  Immediate;   Good  Judgement:  Good  Insight:  Fair  Psychomotor Activity:  Normal  Concentration:  Concentration: Good and Attention Span: Good  Recall:  Good  Fund of Knowledge: Good  Language: Good  Akathisia:  No  Handed:  Right  AIMS (if indicated): not done  Assets:  Communication Skills Desire for Improvement  ADL's:  Intact  Cognition: WNL  Sleep:  Fair   Screenings: GAD-7     Office Visit from 04/11/2017 in [facility] Office Visit from 12/17/2016 in [facility] Counselor from 12/15/2015 in [facility]  Total GAD-7 Score  20  16  13     PHQ2-9     Office Visit from 04/11/2017 in [facility] Office Visit from 12/17/2016 in [facility] Counselor from 12/15/2015 in [facility] Office Visit from 05/24/2015 in [facility] Counselor from 07/06/2014 in [facility]AP[facility] [place]  PHQ-2 Total Score  4  3  1   0  5  PHQ-9 Total Score  17  13  5   No data  14       Assessment and Plan:  William Fitzgerald is a 32 y.o. year old male with a history of depression,  multiple sclerosis (diagnosed in 2015), who presents for follow up appointment for Moderate episode of recurrent major depressive disorder (HCC)  Attention deficit hyperactivity disorder (ADHD), unspecified ADHD type - Plan: Ambulatory referral to Psychology   # MDD, moderate, recurrent without psychotic features  Patient continues to endorse neurovegetative symptoms with anxiety. Psychosocial stressors including loss of his father, MS, school and relationship issues. He self discontinued Wellbutrin due to adverse reaction of fatigue. Although discussed pharmacological options of switching to another antidepressant or try an adjunctive treatment, he is not  interested in these option and will prefer to stay on Lexapro only. Will continue Lexapro to target depression. Discussed behavioral activation. Explored his value of connectedness with other people and the way he can engage in value congruent action. Discussed cognitive defusion. Will continue to see his therapist.   # r/o ADHD # r/o neurocognitive deficit He continues to endorse inattention and reports symptoms of ADHD. He has no known history of ADHD, while he reports struggling with attention since high school. It is likely multifactorial given MS, marijuana use and active mood symptoms. Although it is preferable to first work on treatment for depression/anxiety, he is not amenable to any medication change at this time. Will make referral for neuropsych evaluation for ADHD.   # marijuana use Patient reports occasional marijuana use and is) contemplative stage for sobriety . Will continue motivational interview .   Plan I have reviewed and updated plans as below 1. Continue lexapro 20 mg daily  2. Discontinue Wellbutrin (patient self discontinued) 3. Referral to neuropsychological testing 4. Return to clinic in six weeks to two months for 30 mins - obtain TSH by his provider, likely in June  The patient demonstrates the following risk factors for suicide: Chronic risk factors for suicide include: psychiatric disorder of depression. Acute risk factors for suicide include: N/A. Protective factors for this patient include: positive social support, coping skills and hope for the future. Considering these factors, the overall suicide risk at this point appears to be low. Patient is appropriate for outpatient follow up.  The duration of this appointment visit was 30 minutes of face-to-face time with the patient.  Greater than 50% of this time was spent in counseling, explanation of  diagnosis, planning of further management, and coordination of care.  Neysa Hotter, MD 07/08/2017, 3:44 PM

## 2017-07-03 ENCOUNTER — Ambulatory Visit (HOSPITAL_COMMUNITY): Payer: Self-pay | Admitting: Licensed Clinical Social Worker

## 2017-07-08 ENCOUNTER — Ambulatory Visit (INDEPENDENT_AMBULATORY_CARE_PROVIDER_SITE_OTHER): Payer: 59 | Admitting: Psychiatry

## 2017-07-08 VITALS — BP 137/80 | HR 68 | Ht 75.0 in | Wt 354.0 lb

## 2017-07-08 DIAGNOSIS — Z818 Family history of other mental and behavioral disorders: Secondary | ICD-10-CM | POA: Diagnosis not present

## 2017-07-08 DIAGNOSIS — Z87891 Personal history of nicotine dependence: Secondary | ICD-10-CM

## 2017-07-08 DIAGNOSIS — F909 Attention-deficit hyperactivity disorder, unspecified type: Secondary | ICD-10-CM | POA: Diagnosis not present

## 2017-07-08 DIAGNOSIS — R450 Nervousness: Secondary | ICD-10-CM

## 2017-07-08 DIAGNOSIS — F1290 Cannabis use, unspecified, uncomplicated: Secondary | ICD-10-CM

## 2017-07-08 DIAGNOSIS — F419 Anxiety disorder, unspecified: Secondary | ICD-10-CM | POA: Diagnosis not present

## 2017-07-08 DIAGNOSIS — F331 Major depressive disorder, recurrent, moderate: Secondary | ICD-10-CM

## 2017-07-08 NOTE — Patient Instructions (Addendum)
1. Continue lexapro 20 mg daily  2. Discontinue Wellbutrin  3. Referral to neuropsychological testing 4. Return to clinic in six weeks to two months for 30 mins 5. Obtain blood test by your provider (TSH)

## 2017-07-10 ENCOUNTER — Encounter: Payer: Self-pay | Admitting: Psychology

## 2017-07-16 ENCOUNTER — Ambulatory Visit: Payer: 59 | Admitting: Diagnostic Neuroimaging

## 2017-07-24 ENCOUNTER — Ambulatory Visit (INDEPENDENT_AMBULATORY_CARE_PROVIDER_SITE_OTHER): Payer: 59 | Admitting: Licensed Clinical Social Worker

## 2017-07-24 DIAGNOSIS — F331 Major depressive disorder, recurrent, moderate: Secondary | ICD-10-CM

## 2017-07-24 DIAGNOSIS — F411 Generalized anxiety disorder: Secondary | ICD-10-CM

## 2017-07-30 ENCOUNTER — Encounter (HOSPITAL_COMMUNITY): Payer: Self-pay | Admitting: Licensed Clinical Social Worker

## 2017-07-30 NOTE — Progress Notes (Signed)
   THERAPIST PROGRESS NOTE  Session Time: 4-5  Participation Level: Active  Behavioral Response: Fairly GroomedAlertDepressed and Irritable  Type of Therapy: Individual Therapy  Treatment Goals addressed: Anxiety and Diagnosis: MDD  Interventions: CBT and Supportive  Summary: William Fitzgerald is a 32 y.o. male who presents with MDD and GAD. He reports his father died suddenly one month ago, and this has "hit him very hard". Pt's father was 12 and had a hx of heart problems. Pt is agitated and irriated in session and on the verge of tears throughout session. He states he feels overwhelmed and does not know how to process the grief.   Pt reports he is feeling "himself draw closer to his loved ones" and even reports he is trying his best to rekindle a romantic relationship of which he has been very guarded about w/ this Clinical research associate. He states they are "coming up on 1 year anniversary" despite previously reporting he "didn't know if they were together". Counselor and pt spent time discussing pt's current relationship and ways of handling anxiety. Pt states he will have to drop a class at [employer] bc he is so behind w/ the work.  Suicidal/Homicidal: Nowithout intent/plan  Therapist Response: Counselor used supportive, active listening to help pt gain familiarity w/ his grief sxs. Counselor used intentional silence. Counselor reflected emotions and used open question to help pt verbalize his use of coping skills.  Plan: Return again in 2 weeks.  Diagnosis:    ICD-10-CM   1. Moderate episode of recurrent major depressive disorder (HCC) F33.1   2. Generalized anxiety disorder F41.1      Margo Common, LCAS-A 07/30/2017

## 2017-09-04 ENCOUNTER — Ambulatory Visit (INDEPENDENT_AMBULATORY_CARE_PROVIDER_SITE_OTHER): Payer: 59 | Admitting: Licensed Clinical Social Worker

## 2017-09-04 ENCOUNTER — Encounter

## 2017-09-04 DIAGNOSIS — F331 Major depressive disorder, recurrent, moderate: Secondary | ICD-10-CM | POA: Diagnosis not present

## 2017-09-04 DIAGNOSIS — F411 Generalized anxiety disorder: Secondary | ICD-10-CM

## 2017-09-04 DIAGNOSIS — Z634 Disappearance and death of family member: Secondary | ICD-10-CM | POA: Diagnosis not present

## 2017-09-05 ENCOUNTER — Encounter (HOSPITAL_COMMUNITY): Payer: Self-pay | Admitting: Licensed Clinical Social Worker

## 2017-09-05 NOTE — Progress Notes (Signed)
   THERAPIST PROGRESS NOTE  Session Time: 4-5  Participation Level: Active  Behavioral Response: Casual and Well GroomedAlertIrritable  Type of Therapy: Individual Therapy  Treatment Goals addressed: Coping and Diagnosis: MDD  Interventions: CBT, Strength-based and Supportive  Summary: William Fitzgerald is a 32 y.o. male who presents with MDD and anxiety recently exacerbated by sudden death of his father 2 mo ago. Pt is irritable and somewhat anxious in session. Pt states "he's been ok" the past month and continues to work. He states he is frustrated w/ his girlfriend since she is asking him to "talk about his feelings more concerning his father" and pt "only wants to talk when he feels like it".  Pt reports he feels more confident w/ peers at school and recently felt proud about a school project in which he presented at a "mock conference" on religion. 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 Pt denies SI/HI  Suicidal/Homicidal: Nowithout intent/plan  Therapist Response: Counselor used open questions and information gathering. Counselor used reflection of pt's progress and validation. Counselor assessed pt's level of functioning and medication compliance.   Plan: Return again in 4 weeks.  Diagnosis:    ICD-10-CM   1. Moderate episode of recurrent major depressive disorder (HCC) F33.1   2. Generalized anxiety disorder F41.1        Margo Common, LCAS-A 09/05/2017

## 2017-09-16 ENCOUNTER — Encounter: Payer: Self-pay | Admitting: Psychology

## 2017-09-18 ENCOUNTER — Ambulatory Visit (INDEPENDENT_AMBULATORY_CARE_PROVIDER_SITE_OTHER): Payer: 59 | Admitting: Licensed Clinical Social Worker

## 2017-09-18 DIAGNOSIS — F331 Major depressive disorder, recurrent, moderate: Secondary | ICD-10-CM | POA: Diagnosis not present

## 2017-09-18 DIAGNOSIS — F411 Generalized anxiety disorder: Secondary | ICD-10-CM | POA: Diagnosis not present

## 2017-09-20 ENCOUNTER — Encounter (HOSPITAL_COMMUNITY): Payer: Self-pay | Admitting: Licensed Clinical Social Worker

## 2017-09-20 NOTE — Progress Notes (Signed)
   THERAPIST PROGRESS NOTE  Session Time: 2-3  Participation Level: Active  Behavioral Response: Casual and Fairly GroomedAlertDepressed and Dysphoric  Type of Therapy: Individual Therapy  Treatment Goals addressed: Diagnosis: MDD, GAD  Interventions: CBT, Motivational Interviewing and Supportive  Summary: William Fitzgerald is a 32 y.o. male who presents with MDD, GAD, and recent unexpected death of pt's father in 07-29-17. He is agitated and lacks interest in session. He reports his mood has fluctuated but remains mostly down. Pt continues to refuse medication for anxiety and depression. He states he feels that he is improving his coping skills and ability to handle depression. He denies SI. When asked what pt would like from therapy, pt responds he wants to be better able to "complete tasks such as homework". Pt reports he missed his recent appointment w/ Dr. Kieth Brightly and was unaware that an appointment had been made on his behalf, until the day before. Pt is still interested in getting tested for ADHD. Counselor and pt spent time exploring pt's early memories from childhood. Pt reported he remembers mowing the lawn at 32yo and being told he missed his baseball team's championship game and "he felt pretty stupid".   Suicidal/Homicidal: Nowithout intent/plan  Therapist Response: Counselor used open questions, reflection, empathy, and encouragement. Counselor explored early adverse childhood experiences, resulting mood sxs, and early meaning making.  Plan: Return again in 2 weeks.  Diagnosis:    ICD-10-CM   1. Moderate episode of recurrent major depressive disorder (HCC) F33.1   2. Generalized anxiety disorder F41.1       Margo Common, LCAS-A 09/20/2017

## 2017-11-04 ENCOUNTER — Encounter

## 2017-11-04 ENCOUNTER — Ambulatory Visit (INDEPENDENT_AMBULATORY_CARE_PROVIDER_SITE_OTHER): Payer: 59 | Admitting: Diagnostic Neuroimaging

## 2017-11-04 ENCOUNTER — Encounter: Payer: Self-pay | Admitting: Diagnostic Neuroimaging

## 2017-11-04 ENCOUNTER — Telehealth: Payer: Self-pay | Admitting: Diagnostic Neuroimaging

## 2017-11-04 VITALS — BP 125/69 | HR 72 | Ht 75.0 in | Wt 346.2 lb

## 2017-11-04 DIAGNOSIS — G44229 Chronic tension-type headache, not intractable: Secondary | ICD-10-CM

## 2017-11-04 DIAGNOSIS — G35 Multiple sclerosis: Secondary | ICD-10-CM | POA: Diagnosis not present
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
## 2017-11-04 NOTE — Telephone Encounter (Signed)
Patient was seen today and did not check-out after appt. I called patient and LVM informing him that there is a 7 month follow up as well as blood work and an MRI to be scheduled. I requested that he call our office to schedule his follow up and confirm blood work.

## 2017-11-05 ENCOUNTER — Telehealth: Payer: Self-pay | Admitting: Diagnostic Neuroimaging

## 2017-11-05 NOTE — Telephone Encounter (Signed)
[employer] Auth: NPR via [employer] website order sent to GI. They will reach out to the pt to schedule.  °

## 2017-11-08 LAB — COMPREHENSIVE METABOLIC PANEL
ALBUMIN: 4.2 g/dL (ref 3.5–5.5)
ALT: 14 IU/L (ref 0–44)
AST: 18 IU/L (ref 0–40)
Albumin/Globulin Ratio: 1.6 (ref 1.2–2.2)
Alkaline Phosphatase: 48 IU/L (ref 39–117)
BUN/Creatinine Ratio: 10 (ref 9–20)
BUN: 7 mg/dL (ref 6–20)
Bilirubin Total: 2.3 mg/dL — ABNORMAL HIGH (ref 0.0–1.2)
CALCIUM: 9.8 mg/dL (ref 8.7–10.2)
CO2: 26 mmol/L (ref 20–29)
Chloride: 103 mmol/L (ref 96–106)
Creatinine, Ser: 0.7 mg/dL — ABNORMAL LOW (ref 0.76–1.27)
GFR, EST AFRICAN AMERICAN: 147 mL/min/{1.73_m2} (ref >59)
GFR, EST NON AFRICAN AMERICAN: 128 mL/min/{1.73_m2} (ref >59)
GLUCOSE: 88 mg/dL (ref 65–99)
Globulin, Total: 2.6 g/dL (ref 1.5–4.5)
Potassium: 4.7 mmol/L (ref 3.5–5.2)
Sodium: 142 mmol/L (ref 134–144)
TOTAL PROTEIN: 6.8 g/dL (ref 6.0–8.5)

## 2017-11-08 LAB — CBC WITH DIFFERENTIAL/PLATELET
BASOS ABS: 0.1 10*3/uL (ref 0.0–0.2)
BASOS: 1 %
EOS (ABSOLUTE): 0.1 10*3/uL (ref 0.0–0.4)
Eos: 1 %
Hematocrit: 43.4 % (ref 37.5–51.0)
Hemoglobin: 13.8 g/dL (ref 13.0–17.7)
IMMATURE GRANS (ABS): 0 10*3/uL (ref 0.0–0.1)
IMMATURE GRANULOCYTES: 0 %
LYMPHS: 24 %
Lymphocytes Absolute: 1.5 10*3/uL (ref 0.7–3.1)
MCH: 26 pg — ABNORMAL LOW (ref 26.6–33.0)
MCHC: 31.8 g/dL (ref 31.5–35.7)
MCV: 82 fL (ref 79–97)
Monocytes Absolute: 0.6 10*3/uL (ref 0.1–0.9)
Monocytes: 10 %
Neutrophils Absolute: 4 10*3/uL (ref 1.4–7.0)
Neutrophils: 64 %
PLATELETS: 339 10*3/uL (ref 150–450)
RBC: 5.31 x10E6/uL (ref 4.14–5.80)
RDW: 14.1 % (ref 12.3–15.4)
WBC: 6.2 10*3/uL (ref 3.4–10.8)

## 2017-11-08 LAB — IMMUNOGLOBULINS A/E/G/M, SERUM
IGA/IMMUNOGLOBULIN A, SERUM: 411 mg/dL — AB (ref 90–386)
IgE (Immunoglobulin E), Serum: 66 IU/mL (ref 6–495)
IgG (Immunoglobin G), Serum: 973 mg/dL (ref 700–1600)
IgM (Immunoglobulin M), Srm: 40 mg/dL (ref 20–172)

## 2017-11-11 ENCOUNTER — Telehealth: Payer: Self-pay | Admitting: *Deleted

## 2017-11-11 NOTE — Telephone Encounter (Signed)
LMVM for pt pt return call for lab results.  If he calls back may report that they were unremarkable.

## 2017-11-11 NOTE — Telephone Encounter (Signed)
Pt aware labs were unremarkable

## 2017-11-11 NOTE — Telephone Encounter (Signed)
-----   Message from Suanne Marker, MD sent at 11/11/2017 10:11 AM EDT ----- Unremarkable labs. Continue current plan. Please call patient. -VRP

## 2017-11-11 NOTE — Telephone Encounter (Signed)
Noted  

## 2017-11-15 ENCOUNTER — Other Ambulatory Visit: Payer: Self-pay

## 2017-11-15 ENCOUNTER — Encounter: Payer: 59 | Attending: Psychology | Admitting: Psychology

## 2017-11-15 DIAGNOSIS — F331 Major depressive disorder, recurrent, moderate: Secondary | ICD-10-CM | POA: Insufficient documentation

## 2017-11-15 DIAGNOSIS — F908 Attention-deficit hyperactivity disorder, other type: Secondary | ICD-10-CM

## 2017-11-15 DIAGNOSIS — G35 Multiple sclerosis: Secondary | ICD-10-CM | POA: Diagnosis not present

## 2017-11-30 ENCOUNTER — Encounter: Payer: Self-pay | Admitting: Psychology

## 2017-11-30 NOTE — Progress Notes (Signed)
Neuropsychological Consultation   Patient:   William Fitzgerald   DOB:   [birth year]  MR Number:  [id number]  Location:  [facility] FOR PAIN AND [facility] MEDICINE [facility] PHYSICAL MEDICINE AND REHABILITATION [address] [place] [id number] [place] [address] Dept: [phone]           Date of Service:   11/15/2017  Start Time:   8 AM End Time:   9 AM  Provider/Observer:  Arley Phenix, Psy.D.       Clinical Neuropsychologist       Billing Code/Service: [phone] 4 Units  Chief Complaint:    The patient was referred by Dr. Vanetta Shawl for a neuropsychological evaluation as part of her efforts for differential diagnoses and treatment of the patient.  The patient describes developing difficulties focusing and attention/concentration issues since high school.  The patient denies observed difficulties in elementary, middle or in the initial part of high school with regard to attentional deficits.  The patient reports that these difficulties were present prior to the diagnosis of multiple sclerosis.  The patient reports that he first started noticing his attentional issues around 2010.  The patient does have issues with depression as well.   Reason for Service:  William Fitzgerald is a 32 year old male referred for neuropsychological evaluation is part of the differential diagnostic questions being asked by Dr. Vanetta Shawl who is his treating psychiatrist.  The patient has been diagnosed with multiple sclerosis and below is the impression from the most recent MRI of his brain.  He is being followed by neurology for his MS.  The patient also describes issues of depression developing after his diagnosis of MS.  The patient reports developing and experiencing problems with attention and concentration played in high school.  He reports that he first noticed these issues clearly around 2010.  The patient reports that he has having difficulty focusing and maintaining attention and  concentration.  The patient also describes depression, anxiety and panic symptoms as well.  IMPRESSION:  This MRI of the brain with and without contrast shows the following: 1.    Multiple T2/FLAIR hyperintense foci in the cerebellum, brainstem, thalamus and cerebral hemispheres in a pattern and configuration consistent with chronic demyelinating plaques associated with multiple sclerosis. None of the foci appeared to be acute or enhance on the current study. When compared to the study dated 09/13/2015, there are no definite new lesions. Several lesions that were enhancing on the 2017 MRI no longer do so. 2.    There is a normal enhancement pattern.  Current Status:  The patient is describing issues related to depression, anxiety and panic and has been treated by his psychiatrist for these issues.  The patient appears to have responded well to Wellbutrin.  The patient also has been diagnosed with multiple sclerosis and is being followed by neurology.  There are clear findings on MRI regarding evidence of a demyelinating condition such as MS occurring but follow-up MRIs have not shown any significant change with the most recent MRI.  The patient does describe concerns about difficulty focusing and having attentional issues.  The patient reports that these attentional issues were present prior to his diagnosis of MS.  Reliability of Information: The information is derived from a 1 hour face-to-face clinical interview with the patient as well as a review of available medical records.  Behavioral Observation: William Fitzgerald  presents as a 32 y.o.-year-old Right  Male who appeared his stated age. his dress was  Appropriate and he was Well Groomed and his manners were Appropriate to the situation.  his participation was indicative of Appropriate behaviors.  There were any physical disabilities noted.  he displayed an appropriate level of cooperation and motivation.     Interactions:    Active  Appropriate  Attention:   within normal limits and attention span and concentration were age appropriate  Memory:   within normal limits; recent and remote memory intact  Visuo-spatial:  not examined  Speech (Volume):  normal  Speech:   normal; normal  Thought Process:  Coherent and Relevant  Though Content:  WNL; not suicidal and not homicidal  Orientation:   person, place, time/date and situation  Judgment:   Good  Planning:   Good  Affect:    Anxious  Mood:    Anxious and Depressed  Insight:   Good  Intelligence:   normal  Marital Status/Living: The patient is not married and has no children.  He currently lives with his mother.  Current Employment: The patient has been working for the past 7 years for [employer].  Past Employment:    Substance Use:  No concerns of substance abuse are reported.    Education:   HS Graduate  Medical History:   Past Medical History:  Diagnosis Date  . Anxiety   . Depression   . DVT (deep venous thrombosis) (HCC) 2014  . MS (multiple sclerosis) (HCC)   . MS (multiple sclerosis) (HCC)    Psychiatric History:  The patient reports that he has been dealing with anxiety and depressive symptoms particularly since his diagnosis of MS.  There has been a recent stressor related to a death of a family member.  The patient reports that he first began experiencing problems with attention and concentration at the end of high school/around 2010.  Family Med/Psych History:  Family History  Problem Relation Age of Onset  . Seizures Other   . Lung cancer Maternal Aunt   . Diabetes Maternal Grandfather   . Lung cancer Maternal Aunt   . Schizophrenia Cousin   . ADD / ADHD Cousin     Risk of Suicide/Violence: low the patient denies any suicidal or homicidal ideation.  Impression/DX:  William Fitzgerald is a 32 year old male referred for neuropsychological evaluation is part of the differential diagnostic questions being asked by Dr.  Vanetta Shawl who is his treating psychiatrist.  The patient has been diagnosed with multiple sclerosis and below is the impression from the most recent MRI of his brain.  He is being followed by neurology for his MS.  The patient also describes issues of depression developing after his diagnosis of MS.  The patient reports developing and experiencing problems with attention and concentration played in high school.  He reports that he first noticed these issues clearly around 2010.  The patient reports that he has having difficulty focusing and maintaining attention and concentration.  The patient also describes depression, anxiety and panic symptoms as well.  The patient is describing issues related to depression, anxiety and panic and has been treated by his psychiatrist for these issues.  The patient appears to have responded well to Wellbutrin.  The patient also has been diagnosed with multiple sclerosis and is being followed by neurology.  There are clear findings on MRI regarding evidence of a demyelinating condition such as MS occurring but follow-up MRIs have not shown any significant change with the most recent MRI.  The patient does describe concerns about difficulty focusing and  having attentional issues.  The patient reports that these attentional issues were present prior to his diagnosis of MS.    Disposition/Plan:  We have set the patient up for formal neuropsychological testing utilizing the comprehensive attention battery and the CPT.  The patient is scheduled for the neuropsychological testing to be completed on 01/17/2018 with formal interpretation and report writing to be conducted on 01/21/2018.  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

## 2017-12-06 ENCOUNTER — Ambulatory Visit
Admission: RE | Admit: 2017-12-06 | Discharge: 2017-12-06 | Disposition: A | Discharge status: Home / Self Care | Payer: 59 | Source: Ambulatory Visit | Attending: Diagnostic Neuroimaging | Admitting: Diagnostic Neuroimaging

## 2017-12-06 DIAGNOSIS — G44229 Chronic tension-type headache, not intractable: Secondary | ICD-10-CM

## 2017-12-06 MED ORDER — GADOBENATE DIMEGLUMINE 529 MG/ML IV SOLN
20.0000 mL | Freq: Once | INTRAVENOUS | Status: AC | PRN
  Administered 2017-12-06: 20 mL via INTRAVENOUS

## 2017-12-09 ENCOUNTER — Telehealth: Payer: Self-pay | Admitting: *Deleted

## 2017-12-09 NOTE — Telephone Encounter (Signed)
Spoke with patient and informed him his MRI brain is stable. He verbalized understanding, appreciation.

## 2018-01-17 ENCOUNTER — Encounter: Payer: 59 | Admitting: Psychology

## 2018-01-21 ENCOUNTER — Encounter: Payer: 59 | Admitting: Psychology

## 2018-01-24 ENCOUNTER — Encounter: Payer: 59 | Admitting: Psychology
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
## 2018-02-21 ENCOUNTER — Ambulatory Visit (INDEPENDENT_AMBULATORY_CARE_PROVIDER_SITE_OTHER): Payer: 59 | Admitting: Licensed Clinical Social Worker

## 2018-02-21 ENCOUNTER — Encounter (HOSPITAL_COMMUNITY): Payer: Self-pay | Admitting: Licensed Clinical Social Worker

## 2018-02-21 DIAGNOSIS — F331 Major depressive disorder, recurrent, moderate: Secondary | ICD-10-CM

## 2018-02-21 DIAGNOSIS — F411 Generalized anxiety disorder: Secondary | ICD-10-CM

## 2018-02-21 NOTE — Progress Notes (Signed)
   THERAPIST PROGRESS NOTE  Session Time: 9-10  Participation Level: Active  Behavioral Response: Casual and Fairly GroomedAlertDepressed  Type of Therapy: Individual Therapy  Treatment Goals addressed: Diagnosis: MDD, GAD  Interventions: CBT and Motivational Interviewing  Summary: William Fitzgerald is a 32 y.o. male who presents with MDD and GAD and recent loss of 3 family members in past 6 months.  "I'm still struggling w/ attention, focus, and commitment to getting better. I want to get better but I don't think I'll be able to follow-through."  Pt is active, engaged, tangential in thinking, makes intermittent eye contact, and has blunted affect. He reports his aunt and uncle have both died in recent months, which follows his father's death in Aug 23, 2022 of this year. He continues to attend [employer] and states that his classes are going "pretty well". He is studying Public house manager.  He states his attention span continues to vary. He has broken up w/ his S/O though he continues to be cordial towards her at work. Counselor and pt discuss pt's current state and pros and cons of wanting to get better. Counselor encourages pt to make f/u appointment w/ Dr. Vanetta Shawl to discuss medications.   Suicidal/Homicidal: Nowithout intent/plan  Therapist Response: Counselor used open questions, active listening, supportive encouragement, and reframing. Pt appears in significant distress and admits he feels somewhat hopeless about his ability to work on his mental health issues. He lacks motivation and commitment. He is ambivalent about medication but appears interested in trying a new medication for his ongoing depression and anxiety.   Plan: Return again in 4 weeks. Make Appointment w/ Dr. Vanetta Shawl for medication management.   Diagnosis:    ICD-10-CM   1. Moderate episode of recurrent major depressive disorder (HCC) F33.1   2. Generalized anxiety disorder F41.1       Margo Common,  LCAS-A 02/21/2018

## 2018-03-11 NOTE — Progress Notes (Signed)
BH MD/PA/NP OP Progress Note  03/14/2018 10:03 AM William Fitzgerald  MRN:  [id number]  Chief Complaint:  Chief Complaint    Follow-up; Depression     HPI:  - the patient was not seen since March Patient presents for follow-up appointment for depression.  He states that he discontinued several months ago as he thought he was doing good.  He reports that his father deceased in 2022-06-25, and he has been feeling depressed.  He talks about an episode of him becoming angry towards his family member, whom mentioned about his father's name.  He agrees that he was angry and felt sad as he missed his father. He wants to be "happier." He reports that he was happy when he celebrated his birthday with his friend, although it was different compared to before. Discussed the action he can take in the midst of sadness.  He feels stressed as he has been procrastinating things at school, which he attributes to depression.  He reports that he also feels depressed about MS, although he has been doing well from physical standpoint.  He has initial insomnia.  He feels fatigue.  He has difficulty in concentration.  He denies SI.  He feels anxious and tense especially when he is around with people.  He denies panic attacks.  He cut down his marijuana use; he uses it a few times per month.    Per PMP,  Clonazepam last filled on 04/11/2017    Visit Diagnosis:    ICD-10-CM   1. Moderate episode of recurrent major depressive disorder (HCC) F33.1     Past Psychiatric History: Please see initial evaluation for full details. I have reviewed the history. No updates at this time.     Past Medical History:  Past Medical History:  Diagnosis Date  . Anxiety   . Depression   . DVT (deep venous thrombosis) (HCC) 2014  . MS (multiple sclerosis) (HCC)   . MS (multiple sclerosis) (HCC)     Past Surgical History:  Procedure Laterality Date  . DENTAL SURGERY  2016  . VASCULAR SURGERY Right 06-25-2013    Family Psychiatric  History: Please see initial evaluation for full details. I have reviewed the history. No updates at this time.     Family History:  Family History  Problem Relation Age of Onset  . Seizures Other   . Lung cancer Maternal Aunt   . Diabetes Maternal Grandfather   . Lung cancer Maternal Aunt   . Schizophrenia Cousin   . ADD / ADHD Cousin     Social History:  Social History   Socioeconomic History  . Marital status: Single    Spouse name: Not on file  . Number of children: 0  . Years of education: 12th  . Highest education level: Not on file  Occupational History    Employer: [employer]     Comment: [employer]  Social Needs  . Financial resource strain: Not on file  . Food insecurity:    Worry: Not on file    Inability: Not on file  . Transportation needs:    Medical: Not on file    Non-medical: Not on file  Tobacco Use  . Smoking status: Former Smoker    Types: Cigarettes  . Smokeless tobacco: Never Used  Substance and Sexual Activity  . Alcohol use: No    Alcohol/week: 0.0 standard drinks  . Drug use: No    Comment: quit marijuana 2014  . Sexual activity: Yes  Birth control/protection: Condom  Lifestyle  . Physical activity:    Days per week: Not on file    Minutes per session: Not on file  . Stress: Not on file  Relationships  . Social connections:    Talks on phone: Not on file    Gets together: Not on file    Attends religious service: Not on file    Active member of club or organization: Not on file    Attends meetings of clubs or organizations: Not on file    Relationship status: Not on file  Other Topics Concern  . Not on file  Social History Narrative   Patient lives at home with family.   Caffeine Use: tea occasionally    Allergies:  Allergies  Allergen Reactions  . Gadolinium Derivatives Nausea Only    Pt became nauseous after contrast    Metabolic Disorder Labs: No results found for: HGBA1C, MPG No results found for:  PROLACTIN No results found for: CHOL, TRIG, HDL, CHOLHDL, VLDL, LDLCALC No results found for: TSH  Therapeutic Level Labs: No results found for: LITHIUM No results found for: VALPROATE No components found for:  CBMZ  Current Medications: Current Outpatient Medications  Medication Sig Dispense Refill  . Cholecalciferol (VITAMIN D) 2000 units CAPS Take 2,000 Units by mouth daily.    . clonazePAM (KLONOPIN) 1 MG tablet Take 1 tablet (1 mg total) by mouth at bedtime. 30 tablet 0  . cyclobenzaprine (FLEXERIL) 10 MG tablet Take 1 tablet (10 mg total) by mouth 3 (three) times daily as needed for muscle spasms. 20 tablet 0  . diazepam (VALIUM) 5 MG tablet Take 1 tablet (5 mg total) by mouth every 12 (twelve) hours as needed for anxiety. 14 tablet 0  . hydrocortisone 2.5 % lotion Apply topically 2 (two) times daily. 59 mL 0  . ibuprofen (ADVIL,MOTRIN) 600 MG tablet Take 1 tablet (600 mg total) by mouth every 6 (six) hours as needed. 30 tablet 0  . Ocrelizumab (OCREVUS IV) Inject into the vein. Every 6 months    . escitalopram (LEXAPRO) 20 MG tablet 10 mg daily for one week, then 20 mg daily 30 tablet 0   No current facility-administered medications for this visit.      Musculoskeletal: Strength & Muscle Tone: within normal limits Gait & Station: normal Patient leans: N/A  Psychiatric Specialty Exam: Review of Systems  Psychiatric/Behavioral: Positive for depression. Negative for hallucinations, memory loss, substance abuse and suicidal ideas. The patient is nervous/anxious and has insomnia.   All other systems reviewed and are negative.   Blood pressure 128/80, pulse 65, height 6\' 3"  (1.905 m), weight (!) 336 lb (152.4 kg), SpO2 100 %.Body mass index is 42 kg/m.  General Appearance: Fairly Groomed  Eye Contact:  Good  Speech:  Clear and Coherent  Volume:  Normal  Mood:  Depressed  Affect:  Appropriate, Congruent and down  Thought Process:  Coherent  Orientation:  Full (Time, Place,  and Person)  Thought Content: Logical   Suicidal Thoughts:  No  Homicidal Thoughts:  No  Memory:  Immediate;   Good  Judgement:  Good  Insight:  Good  Psychomotor Activity:  Normal  Concentration:  Concentration: Good and Attention Span: Good  Recall:  Good  Fund of Knowledge: Good  Language: Good  Akathisia:  No  Handed:  Right  AIMS (if indicated): not done  Assets:  Communication Skills Desire for Improvement  ADL's:  Intact  Cognition: WNL  Sleep:  Poor  Screenings: GAD-7     Office Visit from 04/11/2017 in [facility] Office Visit from 12/17/2016 in [facility] Counselor from 12/15/2015 in [facility] [place]  Total GAD-7 Score  20  16  13     PHQ2-9     Office Visit from 04/11/2017 in [facility] Office Visit from 12/17/2016 in [facility] Counselor from 12/15/2015 in BE[facility]AL [facility] OUTPATIENT THERAPY [place] Office Visit from 05/24/2015 in [facility] Counselor from 07/06/2014 in BEHAVIORAL HEALTH OUTPATIENT THERAPY [place]  PHQ-2 Total Score  4  3  1   0  5  PHQ-9 Total Score  17  13  5   -  14       Assessment and Plan:  William Fitzgerald is a 32 y.o. year old male with a history of depression,  multiple sclerosis (diagnosed in 2015), who presents for follow up appointment for Moderate episode of recurrent major depressive disorder (HCC)  # MDD, moderate, recurrent without psychotic features He reports worsening in depression and anxiety in the context of loss of his father in February, MS, school issues.  He also self discontinued Lexapro several months ago.  Will reinitiate Lexapro to target depression and anxiety.  Validated his grief.  Discussed behavioral activation.  Discussed cognitive deffusion. He is encouraged to continue to see his therapist.   # r/o ADHD # r/o neurocognitive deficit He continues to report inattention.  He has no  known history of ADHD, while he has struggled with inattention since high school per report.  Etiology is likely multifactorial given MS, and active mood symptoms.  Although referral was made for neuropsych evaluation, he did not have a good interaction with the provider and canceled the other appointment.  He would like to hold for other referral at this time.  Will continue to monitor.   # marijuana use Patient cut down marijuana use.  Will continue motivational interview.   Plan 1. Start lexapro 10 mg daily for one week, then 20 mg daily  2. Return to clinic in one month for 30 mins - obtain TSH by his provider, likely in June  Past trials of medication: lexapro, Wellbutrin (fatigue), clonazepam,   The patient demonstrates the following risk factors for suicide: Chronic risk factors for suicide include:psychiatric disorder ofdepression. Acute risk factorsfor suicide include: N/A. Protective factorsfor this patient include: positive social support, coping skills and hope for the future. Considering these factors, the overall suicide risk at this point appears to below. Patientisappropriate for outpatient follow up  The duration of this appointment visit was 30 minutes of face-to-face time with the patient.  Greater than 50% of this time was spent in counseling, explanation of  diagnosis, planning of further management, and coordination of care.  Neysa Hotter, MD 03/14/2018, 10:03 AM

## 2018-03-14 ENCOUNTER — Encounter (HOSPITAL_COMMUNITY): Payer: Self-pay | Admitting: Psychiatry

## 2018-03-14 ENCOUNTER — Ambulatory Visit (INDEPENDENT_AMBULATORY_CARE_PROVIDER_SITE_OTHER): Payer: 59 | Admitting: Psychiatry

## 2018-03-14 VITALS — BP 128/80 | HR 65 | Ht 75.0 in | Wt 336.0 lb

## 2018-03-14 DIAGNOSIS — F331 Major depressive disorder, recurrent, moderate: Secondary | ICD-10-CM | POA: Diagnosis not present

## 2018-03-14 MED ORDER — ESCITALOPRAM OXALATE 20 MG PO TABS: ORAL_TABLET | ORAL | 0 refills | Status: DC

## 2018-03-14 NOTE — Patient Instructions (Signed)
1. Start lexapro 10 mg daily for one week, then 20 mg daily  2. Return to clinic in one month for 30 mins

## 2018-04-10 ENCOUNTER — Ambulatory Visit (HOSPITAL_COMMUNITY): Payer: 59 | Admitting: Licensed Clinical Social Worker

## 2018-04-10 NOTE — Progress Notes (Addendum)
BH MD/PA/NP OP Progress Note  04/11/2018 12:07 PM William Fitzgerald  MRN:  [id number]  Chief Complaint:  Chief Complaint    Depression; Follow-up     HPI:  Patient presents for follow-up appointment for depression.  He states that he was very upset when his mother did not try to talk with the patient when he shared with his feelings about loss of his father.  Although he understands that they were separated before the death of his father, he states that he is still a father of her 3 children.  He tries not to do well on the loss as he does not want to stay depressed.  He thinks that is why he feels happy in a moment and down in the next minute. He hopes to go hiking. He enjoys driving. He felt very happy when he passed three exams.  Although he hopes to work in [employer], he knows that it can be very stressful.  He has been trying to take time for himself as he tends to use time for other people.  He sleeps 6 hours.  He feels fatigue at times.  He has difficulty in concentration.  He has a little decreased appetite.  He denies SI.  He feels anxious and tense at times.  He denies panic attacks.  He feels irritable at times.  He sometimes feels restless and fidgety.  He uses marijuana twice a week to feel better.    Visit Diagnosis:    ICD-10-CM   1. Moderate episode of recurrent major depressive disorder (HCC) F33.1     Past Psychiatric History: Please see initial evaluation for full details. I have reviewed the history. No updates at this time.     Past Medical History:  Past Medical History:  Diagnosis Date  . Anxiety   . Depression   . DVT (deep venous thrombosis) (HCC) 2014  . MS (multiple sclerosis) (HCC)   . MS (multiple sclerosis) (HCC)     Past Surgical History:  Procedure Laterality Date  . DENTAL SURGERY  2016  . VASCULAR SURGERY Right 06/2013    Family Psychiatric History: Please see initial evaluation for full details. I have reviewed the history. No  updates at this time.     Family History:  Family History  Problem Relation Age of Onset  . Seizures Other   . Lung cancer Maternal Aunt   . Diabetes Maternal Grandfather   . Lung cancer Maternal Aunt   . Schizophrenia Cousin   . ADD / ADHD Cousin     Social History:  Social History   Socioeconomic History  . Marital status: Single    Spouse name: Not on file  . Number of children: 0  . Years of education: 12th  . Highest education level: Not on file  Occupational History    Employer: [employer]     Comment: [employer]  Social Needs  . Financial resource strain: Not on file  . Food insecurity:    Worry: Not on file    Inability: Not on file  . Transportation needs:    Medical: Not on file    Non-medical: Not on file  Tobacco Use  . Smoking status: Former Smoker    Types: Cigarettes  . Smokeless tobacco: Never Used  Substance and Sexual Activity  . Alcohol use: No    Alcohol/week: 0.0 standard drinks  . Drug use: No    Comment: quit marijuana 2014  . Sexual activity: Yes  Birth control/protection: Condom  Lifestyle  . Physical activity:    Days per week: Not on file    Minutes per session: Not on file  . Stress: Not on file  Relationships  . Social connections:    Talks on phone: Not on file    Gets together: Not on file    Attends religious service: Not on file    Active member of club or organization: Not on file    Attends meetings of clubs or organizations: Not on file    Relationship status: Not on file  Other Topics Concern  . Not on file  Social History Narrative   Patient lives at home with family.   Caffeine Use: tea occasionally    Allergies:  Allergies  Allergen Reactions  . Gadolinium Derivatives Nausea Only    Pt became nauseous after contrast    Metabolic Disorder Labs: No results found for: HGBA1C, MPG No results found for: PROLACTIN No results found for: CHOL, TRIG, HDL, CHOLHDL, VLDL, LDLCALC No results found for:  TSH  Therapeutic Level Labs: No results found for: LITHIUM No results found for: VALPROATE No components found for:  CBMZ  Current Medications: Current Outpatient Medications  Medication Sig Dispense Refill  . Cholecalciferol (VITAMIN D) 2000 units CAPS Take 2,000 Units by mouth daily.    . clonazePAM (KLONOPIN) 1 MG tablet Take 1 tablet (1 mg total) by mouth at bedtime. 30 tablet 0  . cyclobenzaprine (FLEXERIL) 10 MG tablet Take 1 tablet (10 mg total) by mouth 3 (three) times daily as needed for muscle spasms. 20 tablet 0  . diazepam (VALIUM) 5 MG tablet Take 1 tablet (5 mg total) by mouth every 12 (twelve) hours as needed for anxiety. 14 tablet 0  . escitalopram (LEXAPRO) 20 MG tablet Take 1 tablet (20 mg total) by mouth daily. 90 tablet 0  . hydrocortisone 2.5 % lotion Apply topically 2 (two) times daily. 59 mL 0  . ibuprofen (ADVIL,MOTRIN) 600 MG tablet Take 1 tablet (600 mg total) by mouth every 6 (six) hours as needed. 30 tablet 0  . Ocrelizumab (OCREVUS IV) Inject into the vein. Every 6 months     No current facility-administered medications for this visit.      Musculoskeletal: Strength & Muscle Tone: within normal limits Gait & Station: normal Patient leans: N/A  Psychiatric Specialty Exam: Review of Systems  Psychiatric/Behavioral: Positive for depression. Negative for hallucinations, memory loss, substance abuse and suicidal ideas. The patient is nervous/anxious and has insomnia.   All other systems reviewed and are negative.   Blood pressure 130/90, pulse 72, height 6\' 3"  (1.905 m), weight (!) 335 lb (152 kg), SpO2 99 %.Body mass index is 41.87 kg/m.  General Appearance: Fairly Groomed  Eye Contact:  Good  Speech:  Clear and Coherent  Volume:  Normal  Mood:  Depressed  Affect:  Appropriate, Congruent and down at times  Thought Process:  Coherent  Orientation:  Full (Time, Place, and Person)  Thought Content: Logical   Suicidal Thoughts:  No  Homicidal  Thoughts:  No  Memory:  Immediate;   Good  Judgement:  Good  Insight:  Good  Psychomotor Activity:  Normal  Concentration:  Concentration: Good and Attention Span: Good  Recall:  Good  Fund of Knowledge: Good  Language: Good  Akathisia:  No  Handed:  Right  AIMS (if indicated): not done  Assets:  Communication Skills Desire for Improvement  ADL's:  Intact  Cognition: WNL  Sleep:  Fair   Screenings: GAD-7     Office Visit from 04/11/2017 in [facility] Office Visit from 12/17/2016 in [facility] Counselor from 12/15/2015 in [facility] [place]  Total GAD-7 Score  20  16  13     PHQ2-9     Office Visit from 04/11/2017 in [facility] Office Visit from 12/17/2016 in [facility] Counselor from 12/15/2015 in [facility] [place] Office Visit from 05/24/2015 in [facility] Counselor from 07/06/2014 in [facility] [place]  PHQ-2 Total Score  4  3  1   0  5  PHQ-9 Total Score  17  13  5   -  14       Assessment and Plan:  William Fitzgerald is a 32 y.o. year old male with a history of depression, multiple sclerosis (diagnosed in 2015), who presents for follow up appointment for Moderate episode of recurrent major depressive disorder (HCC)  # MDD, moderate, recurrent without psychotic features Patient reports occasional depressive symptoms in the context of loss of his father in February.  Other psychosocial stressors including MS. Will continue Lexapro to target depression and anxiety.  Although he may benefit from adjunctive treatment (mirtazapine or Abilify) in the future, he declines this option at this time.  Discussed behavioral activation.  He is encouraged to continue to see a therapist.   # r/ o ADHD # r/o neurocognitive deficit He reports inattention despite he did past 3 exams.  He has no known history of  ADHD, while he has struggled with inattention since high school by patient's report.  Etiology is likely multifactorial given MS, active mood symptoms and marijuana use.  Although referral was made for neuropsych evaluation, the patient did not have a good interaction with the provider, and canceled the appointment.  Will continue to monitor.   # marijuana use He is at pre-contemplative stage for marijuana use.  Will continue motivational interview.   Plan I have reviewed and updated plans as below 1. Continue lexapro 20 mg daily  2. Return to clinic in three months for 15 mins - obtain TSHby his provider, likely in June  Past trials of medication:lexapro, Wellbutrin (fatigue), clonazepam,  The patient demonstrates the following risk factors for suicide: Chronic risk factors for suicide include:psychiatric disorder ofdepression. Acute risk factorsfor suicide include: N/A. Protective factorsfor this patient include: positive social support, coping skills and hope for the future. Considering these factors, the overall suicide risk at this point appears to below. Patientisappropriate for outpatient follow up   The duration of this appointment visit was 30 minutes of face-to-face time with the patient.  Greater than 50% of this time was spent in counseling, explanation of  diagnosis, planning of further management, and coordination of care.  Neysa Hotter, MD 04/11/2018, 12:07 PM

## 2018-04-11 ENCOUNTER — Encounter (HOSPITAL_COMMUNITY): Payer: Self-pay | Admitting: Psychiatry

## 2018-04-11 ENCOUNTER — Ambulatory Visit (INDEPENDENT_AMBULATORY_CARE_PROVIDER_SITE_OTHER): Payer: 59 | Admitting: Psychiatry

## 2018-04-11 VITALS — BP 130/90 | HR 72 | Ht 75.0 in | Wt 335.0 lb

## 2018-04-11 DIAGNOSIS — F331 Major depressive disorder, recurrent, moderate: Secondary | ICD-10-CM | POA: Diagnosis not present

## 2018-04-11 DIAGNOSIS — F419 Anxiety disorder, unspecified: Secondary | ICD-10-CM

## 2018-04-11 DIAGNOSIS — G4700 Insomnia, unspecified: Secondary | ICD-10-CM

## 2018-04-11 MED ORDER — ESCITALOPRAM OXALATE 20 MG PO TABS: 20.0000 mg | ORAL_TABLET | Freq: Every day | ORAL | 0 refills | Status: DC

## 2018-04-11 NOTE — Patient Instructions (Signed)
1. Continue lexapro 20 mg daily  2. Return to clinic in three months for 15mins 

## 2018-05-23 ENCOUNTER — Ambulatory Visit (INDEPENDENT_AMBULATORY_CARE_PROVIDER_SITE_OTHER): Payer: Self-pay | Admitting: Licensed Clinical Social Worker

## 2018-05-23 ENCOUNTER — Encounter (HOSPITAL_COMMUNITY): Payer: Self-pay | Admitting: Licensed Clinical Social Worker

## 2018-05-23 DIAGNOSIS — F331 Major depressive disorder, recurrent, moderate: Secondary | ICD-10-CM

## 2018-05-23 DIAGNOSIS — F411 Generalized anxiety disorder: Secondary | ICD-10-CM

## 2018-05-23 NOTE — Progress Notes (Signed)
   THERAPIST PROGRESS NOTE  Session Time: 10-11  Participation Level: Active  Behavioral Response: CasualAlertEuthymic  Type of Therapy: Individual Therapy  Treatment Goals addressed: Anxiety  Interventions: CBT and Supportive  Summary: William Fitzgerald is a 33 y.o. male who presents with Anxiety. He is fairly attentive though tangential in session. He discusses his recent hx and how he has been opening up more w/ friends and family members. Pt processes his challenges w/ family conflict and staying focused at school. Counselor recommends pt contact [facility] for an ADHD assessment.    Suicidal/Homicidal: Nowithout intent/plan  Therapist Response: Counselor used open questions, active listening, and reflection. Counselor helped pt grow his sense of accomplishment by reflecting his goals met since last session. Pt continues to present as agitated and unfocused and appears strongly ambivalent towards changing his ADHD bxs.   Plan: Return again in 4 weeks.  Diagnosis:    ICD-10-CM   1. Moderate episode of recurrent major depressive disorder (HCC) F33.1   2. Generalized anxiety disorder F41.Octavia Swan, LCAS-A 05/23/2018

## 2018-06-09 ENCOUNTER — Ambulatory Visit (INDEPENDENT_AMBULATORY_CARE_PROVIDER_SITE_OTHER): Payer: BLUE CROSS/BLUE SHIELD | Admitting: Diagnostic Neuroimaging

## 2018-06-09 ENCOUNTER — Ambulatory Visit (INDEPENDENT_AMBULATORY_CARE_PROVIDER_SITE_OTHER): Payer: BLUE CROSS/BLUE SHIELD | Admitting: Licensed Clinical Social Worker

## 2018-06-09 ENCOUNTER — Encounter: Payer: Self-pay | Admitting: Diagnostic Neuroimaging

## 2018-06-09 VITALS — BP 121/80 | HR 86 | Ht 75.0 in | Wt 358.0 lb

## 2018-06-09 DIAGNOSIS — G44229 Chronic tension-type headache, not intractable: Secondary | ICD-10-CM

## 2018-06-09 DIAGNOSIS — M25561 Pain in right knee: Secondary | ICD-10-CM | POA: Diagnosis not present

## 2018-06-09 DIAGNOSIS — G8929 Other chronic pain: Secondary | ICD-10-CM

## 2018-06-09 DIAGNOSIS — F411 Generalized anxiety disorder: Secondary | ICD-10-CM

## 2018-06-09 DIAGNOSIS — G35 Multiple sclerosis: Secondary | ICD-10-CM

## 2018-06-09 DIAGNOSIS — F331 Major depressive disorder, recurrent, moderate: Secondary | ICD-10-CM | POA: Diagnosis not present
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
## 2018-06-10 ENCOUNTER — Encounter (HOSPITAL_COMMUNITY): Payer: Self-pay | Admitting: Licensed Clinical Social Worker

## 2018-06-10 LAB — CBC WITH DIFFERENTIAL/PLATELET
BASOS ABS: 0.1 10*3/uL (ref 0.0–0.2)
Basos: 1 %
EOS (ABSOLUTE): 0.1 10*3/uL (ref 0.0–0.4)
EOS: 2 %
HEMATOCRIT: 46.4 % (ref 37.5–51.0)
HEMOGLOBIN: 14.5 g/dL (ref 13.0–17.7)
Immature Grans (Abs): 0.1 10*3/uL (ref 0.0–0.1)
Immature Granulocytes: 1 %
Lymphocytes Absolute: 1.6 10*3/uL (ref 0.7–3.1)
Lymphs: 21 %
MCH: 26.1 pg — AB (ref 26.6–33.0)
MCHC: 31.3 g/dL — ABNORMAL LOW (ref 31.5–35.7)
MCV: 84 fL (ref 79–97)
MONOCYTES: 10 %
Monocytes Absolute: 0.8 10*3/uL (ref 0.1–0.9)
NEUTROS ABS: 5.1 10*3/uL (ref 1.4–7.0)
Neutrophils: 65 %
Platelets: 330 10*3/uL (ref 150–450)
RBC: 5.55 x10E6/uL (ref 4.14–5.80)
RDW: 14 % (ref 11.6–15.4)
WBC: 7.8 10*3/uL (ref 3.4–10.8)

## 2018-06-10 LAB — COMPREHENSIVE METABOLIC PANEL
ALBUMIN: 4.5 g/dL (ref 4.1–5.2)
ALT: 33 IU/L (ref 0–44)
AST: 26 IU/L (ref 0–40)
Albumin/Globulin Ratio: 2 (ref 1.2–2.2)
Alkaline Phosphatase: 61 IU/L (ref 39–117)
BUN / CREAT RATIO: 14 (ref 9–20)
BUN: 12 mg/dL (ref 6–20)
Bilirubin Total: 1.5 mg/dL — ABNORMAL HIGH (ref 0.0–1.2)
CO2: 24 mmol/L (ref 20–29)
CREATININE: 0.88 mg/dL (ref 0.76–1.27)
Calcium: 9.4 mg/dL (ref 8.7–10.2)
Chloride: 103 mmol/L (ref 96–106)
GFR calc Af Amer: 133 mL/min/{1.73_m2} (ref >59)
GFR, EST NON AFRICAN AMERICAN: 115 mL/min/{1.73_m2} (ref >59)
GLUCOSE: 68 mg/dL (ref 65–99)
Globulin, Total: 2.2 g/dL (ref 1.5–4.5)
Potassium: 4.7 mmol/L (ref 3.5–5.2)
Sodium: 141 mmol/L (ref 134–144)
Total Protein: 6.7 g/dL (ref 6.0–8.5)

## 2018-06-10 NOTE — Progress Notes (Signed)
   THERAPIST PROGRESS NOTE  Session Time: 4-5  Participation Level: Active  Behavioral Response: Well GroomedAlertEuthymic  Type of Therapy: Individual Therapy  Treatment Goals addressed: Anxiety  Interventions: CBT, Solution Focused and Strength-based  Summary: William Fitzgerald is a 33 y.o. male who presents with MDD, GAD, and Marijuana Use Disorder, Mild.  He states his mother and grandmoter continue to feel he "has a problem w/ marijuana" since he always smells like it. PT downplays this and states he used to do "a lot harder drugs like acid". PT appears upbeat, agitated, and has blunted affect in session. He discusses some physical pain he is in. PT continues to report he is improving. He recently stayed home over the weekend to work on homework, cook, and prevent himself from going out too much. He continues to report ambivalence about addressing his ADHD.   Suicidal/Homicidal: Nowithout intent/plan  Therapist Response: Counselor used open questions, active listening, frank reflection, and humor, to help PT become more at ease, and share his real feelings in session. Counselor challenged PT to make an appointment w/ ADHD specialist at [facility] asap.   Plan: Return again in 4 weeks.  Diagnosis:    ICD-10-CM   1. Moderate episode of recurrent major depressive disorder (HCC) F33.1   2. Generalized anxiety disorder F41.1       Margo Common, LCAS-A 06/10/2018

## 2018-06-16 ENCOUNTER — Telehealth: Payer: Self-pay | Admitting: *Deleted

## 2018-06-16 NOTE — Telephone Encounter (Signed)
Spoke with patient and informed him his labs are unremarkable, stable. Patient verbalized understanding, appreciation.  

## 2018-06-20 ENCOUNTER — Ambulatory Visit (INDEPENDENT_AMBULATORY_CARE_PROVIDER_SITE_OTHER)
Admission: RE | Admit: 2018-06-20 | Discharge: 2018-06-20 | Disposition: A | Discharge status: Home / Self Care | Payer: BLUE CROSS/BLUE SHIELD | Source: Ambulatory Visit | Attending: Family Medicine | Admitting: Family Medicine

## 2018-06-20 ENCOUNTER — Encounter: Payer: Self-pay | Admitting: Family Medicine

## 2018-06-20 ENCOUNTER — Ambulatory Visit (INDEPENDENT_AMBULATORY_CARE_PROVIDER_SITE_OTHER): Payer: BLUE CROSS/BLUE SHIELD | Admitting: Family Medicine

## 2018-06-20 VITALS — BP 130/66 | HR 78 | Resp 16 | Ht 75.0 in | Wt 356.0 lb

## 2018-06-20 DIAGNOSIS — M25561 Pain in right knee: Secondary | ICD-10-CM

## 2018-06-20 DIAGNOSIS — M179 Osteoarthritis of knee, unspecified: Secondary | ICD-10-CM | POA: Insufficient documentation

## 2018-06-20 DIAGNOSIS — M1710 Unilateral primary osteoarthritis, unspecified knee: Secondary | ICD-10-CM | POA: Insufficient documentation

## 2018-06-20 MED ORDER — DICLOFENAC SODIUM 2 % TD SOLN: 1.0000 "application " | Freq: Two times a day (BID) | TRANSDERMAL | 3 refills | Status: DC

## 2018-06-20 NOTE — Patient Instructions (Signed)
Nice to meet you  Please try the exercises  Please try passively bending your knee  Please see me at my [facility] and we can make custom orthotics  Please see me back in 4-5 weeks if no better

## 2018-06-20 NOTE — Progress Notes (Signed)
William Fitzgerald - 33 y.o. male MRN [id number]  Date of birth: [birth year]  SUBJECTIVE:  Including CC & ROS.  No chief complaint on file.   William Fitzgerald is a 33 y.o. male that is presenting with acute right knee pain.  He feels this anteriorly.  It is ongoing for the past 2 months.  It is sharp and throbbing.  It is localized to the knee and he does feel it on the posterior aspect of the lateral lower leg.  Denies any inciting event or previous injury.  He does have a history of multiple sclerosis that was diagnosed in 2015.  He also self-reports a history of DVT and is not currently on anticoagulation.  Reports unable to actively flex his knee to full range of motion.  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  Has not tried many over-the-counter medications or had any improvements.  Seems like his symptoms are staying the same.   Review of Systems  Constitutional: Negative for fever.  HENT: Negative for congestion.   Respiratory: Negative for cough.   Cardiovascular: Negative for chest pain.  Gastrointestinal: Negative for abdominal pain.  Musculoskeletal: Positive for gait problem.  Skin: Negative for color change.  Neurological: Negative for weakness.  Hematological: Negative for adenopathy.  Psychiatric/Behavioral: Negative for agitation.    HISTORY: Past Medical, Surgical, Social, and Family History Reviewed & Updated per EMR.   Pertinent Historical Findings include:  Past Medical History:  Diagnosis Date  . Anxiety   . Depression   . DVT (deep venous thrombosis) ([facility]) 2014  . MS (multiple sclerosis) ([facility])   . MS (multiple sclerosis) ([facility])     Past Surgical History:  Procedure Laterality Date  . DENTAL SURGERY  2016  . VASCULAR SURGERY Right 06/2013    Allergies  Allergen Reactions  . Gadolinium Derivatives Nausea Only    Pt became nauseous after contrast    Family History  Problem Relation Age of Onset  . Seizures Other   . Lung cancer Maternal Aunt   .  Diabetes Maternal Grandfather   . Lung cancer Maternal Aunt   . Schizophrenia Cousin   . ADD / ADHD Cousin      Social History   Socioeconomic History  . Marital status: Single    Spouse name: Not on file  . Number of children: 0  . Years of education: 12th  . Highest education level: Not on file  Occupational History    Employer: [employer]     Comment: [employer]  Social Needs  . Financial resource strain: Not on file  . Food insecurity:    Worry: Not on file    Inability: Not on file  . Transportation needs:    Medical: Not on file    Non-medical: Not on file  Tobacco Use  . Smoking status: Former Smoker    Types: Cigarettes  . Smokeless tobacco: Never Used  Substance and Sexual Activity  . Alcohol use: No    Alcohol/week: 0.0 standard drinks  . Drug use: No    Comment: quit marijuana 2014  . Sexual activity: Yes    Birth control/protection: Condom  Lifestyle  . Physical activity:    Days per week: Not on file    Minutes per session: Not on file  . Stress: Not on file  Relationships  . Social connections:    Talks on phone: Not on file    Gets together: Not on file  Attends religious service: Not on file    Active member of club or organization: Not on file    Attends meetings of clubs or organizations: Not on file    Relationship status: Not on file  . Intimate partner violence:    Fear of current or ex partner: Not on file    Emotionally abused: Not on file    Physically abused: Not on file    Forced sexual activity: Not on file  Other Topics Concern  . Not on file  Social History Narrative   Patient lives at home with family.   Caffeine Use: tea occasionally     PHYSICAL EXAM:  VS: BP 130/66   Pulse 78   Resp 16   Ht 6\' 3"  (1.905 m)   Wt (!) 356 lb (161.5 kg)   SpO2 98%   BMI 44.50 kg/m  Physical Exam Gen: NAD, alert, cooperative with exam, well-appearing ENT: normal lips, normal nasal mucosa,  Eye: normal EOM, normal  conjunctiva and lids CV:  no edema, +2 pedal pulses   Resp: no accessory muscle use, non-labored,  Skin: no rashes, no areas of induration  Neuro: normal tone, normal sensation to touch Psych:  normal insight, alert and oriented MSK:  Right knee: No obvious effusion. Limited active flexion to around 90 degrees.  Passively able to flex to roughly 105 degrees. No instability with valgus or varus stress testing. Normal strength resistance. Tenderness to palpation over the medial joint line.   Negative McMurray's test. Neurovascular intact  Limited ultrasound: Right knee:  No obvious effusion in the suprapatellar pouch. Normal-appearing quadricep and patellar tendon. Normal-appearing medial joint line and medial meniscus. There does appear to be some outpouching of the lateral meniscus.  Summary: No significant structural changes to explain his symptoms today.  Ultrasound and interpretation by Clare Gandy, MD            ASSESSMENT & PLAN:   Acute pain of right knee Unclear if this is related to his knee or his multiple sclerosis.  No structural changes appreciated on ultrasound.  Does have mechanical issues with flexion but passively able to get near normal. -X-ray today. -Counseled on home exercise therapy and supportive care. -[facility]. -If no improvement will consider physical therapy -Can follow-up with me at [facility] to consider custom orthotics

## 2018-06-20 NOTE — Assessment & Plan Note (Signed)
Unclear if this is related to his knee or his multiple sclerosis.  No structural changes appreciated on ultrasound.  Does have mechanical issues with flexion but passively able to get near normal. -X-ray today. -Counseled on home exercise therapy and supportive care. -[facility]. -If no improvement will consider physical therapy -Can follow-up with me at [facility] to consider custom orthotics

## 2018-06-23 ENCOUNTER — Telehealth: Payer: Self-pay | Admitting: Family Medicine

## 2018-06-23 DIAGNOSIS — M25561 Pain in right knee: Secondary | ICD-10-CM

## 2018-06-23 NOTE — Telephone Encounter (Signed)
Spoke with pt and he is aware of Xray results. Pt verbalized understanding.

## 2018-06-23 NOTE — Telephone Encounter (Signed)
Left VM for patient. If he calls back please have him speak with a nurse/CMA and inform that his xray showed a bone tumor. Usually theses are harmless but we need to perform an MRI to make sure. I will put an order in and they will call him to set it up. The [facility] can report results to patient.   If any questions then please take the best time and phone number to call and I will try to call him back.   Myra Rude, MD [facility] 06/23/2018, 9:32 AM

## 2018-06-27 ENCOUNTER — Ambulatory Visit (INDEPENDENT_AMBULATORY_CARE_PROVIDER_SITE_OTHER): Payer: Self-pay

## 2018-07-03 ENCOUNTER — Telehealth: Payer: Self-pay | Admitting: *Deleted

## 2018-07-03 NOTE — Telephone Encounter (Signed)
Called pt to relay he needs to come in to sign ocrevus enrollment form that his previous one will expire 10-31-18.  He verbalized understanding. Will come by maybe today, Friday or Monday.

## 2018-07-07 NOTE — Telephone Encounter (Signed)
Patient signed William Fitzgerald form, signed by Dr Marjory Lies and given to [name].

## 2018-07-08 NOTE — Progress Notes (Signed)
BH MD/PA/NP OP Progress Note  07/11/2018 11:52 AM William Fitzgerald  MRN:  [id number]  Chief Complaint:  Chief Complaint    Follow-up; Depression     HPI:  Patient presents for follow-up appointment for depression.  He states that he discontinued Lexapro after he ran out of medication about a month ago.  He wants to stay off this medication at this time as he is relatively doing well.  He tries to do things to make him feel happy rather than make other people feel happy.  He talks about the trip to [facility].  He was able to get out from his comfort zone, which worked well.  He has started to see a girlf, who he thinks "might be the one."  He hopes to work on relationship so that it would not end and as the previous relationship. He tries to "live everyday." He reports concern about recent finding on his knee x-ray, which he was told that he may have tumor. He has fair sleep.  He feels less tired.  He feels more motivated.  He has difficulty in concentration.  He denies SI.  He feels anxious and tense at times.  He denies panic attacks.  He uses CBD twice a week.    Visit Diagnosis:    ICD-10-CM   1. Mild episode of recurrent major depressive disorder (HCC) F33.0   2. Social anxiety disorder F40.10     Past Psychiatric History: Please see initial evaluation for full details. I have reviewed the history. No updates at this time.     Past Medical History:  Past Medical History:  Diagnosis Date  . Anxiety   . Depression   . DVT (deep venous thrombosis) (HCC) 2014  . MS (multiple sclerosis) (HCC)   . MS (multiple sclerosis) (HCC)     Past Surgical History:  Procedure Laterality Date  . DENTAL SURGERY  2016  . VASCULAR SURGERY Right 06/2013    Family Psychiatric History: Please see initial evaluation for full details. I have reviewed the history. No updates at this time.     Family History:  Family History  Problem Relation Age of Onset  . Seizures Other   . Lung cancer Maternal  Aunt   . Diabetes Maternal Grandfather   . Lung cancer Maternal Aunt   . Schizophrenia Cousin   . ADD / ADHD Cousin     Social History:  Social History   Socioeconomic History  . Marital status: Single    Spouse name: Not on file  . Number of children: 0  . Years of education: 12th  . Highest education level: Not on file  Occupational History    Employer: [employer]     Comment: [employer]  Social Needs  . Financial resource strain: Not on file  . Food insecurity:    Worry: Not on file    Inability: Not on file  . Transportation needs:    Medical: Not on file    Non-medical: Not on file  Tobacco Use  . Smoking status: Former Smoker    Types: Cigarettes  . Smokeless tobacco: Never Used  . Tobacco comment: Smokes a cigarette every once in awhile   Substance and Sexual Activity  . Alcohol use: Yes    Alcohol/week: 0.0 standard drinks    Comment: Reports a beer or glass of wine every other week.   . Drug use: No    Comment: quit marijuana 2014  . Sexual activity: Not Currently  Birth control/protection: Condom  Lifestyle  . Physical activity:    Days per week: Not on file    Minutes per session: Not on file  . Stress: Not on file  Relationships  . Social connections:    Talks on phone: Not on file    Gets together: Not on file    Attends religious service: Not on file    Active member of club or organization: Not on file    Attends meetings of clubs or organizations: Not on file    Relationship status: Not on file  Other Topics Concern  . Not on file  Social History Narrative   Patient lives at home with family.   Caffeine Use: tea occasionally    Allergies:  Allergies  Allergen Reactions  . Gadolinium Derivatives Nausea Only    Pt became nauseous after contrast    Metabolic Disorder Labs: No results found for: HGBA1C, MPG No results found for: PROLACTIN No results found for: CHOL, TRIG, HDL, CHOLHDL, VLDL, LDLCALC No results found for:  TSH  Therapeutic Level Labs: No results found for: LITHIUM No results found for: VALPROATE No components found for:  CBMZ  Current Medications: Current Outpatient Medications  Medication Sig Dispense Refill  . Cholecalciferol (VITAMIN D) 2000 units CAPS Take 2,000 Units by mouth daily.    . clonazePAM (KLONOPIN) 1 MG tablet Take 1 tablet (1 mg total) by mouth at bedtime. (Patient not taking: Reported on 07/11/2018) 30 tablet 0  . cyclobenzaprine (FLEXERIL) 10 MG tablet Take 1 tablet (10 mg total) by mouth 3 (three) times daily as needed for muscle spasms. (Patient not taking: Reported on 07/11/2018) 20 tablet 0  . diazepam (VALIUM) 5 MG tablet Take 1 tablet (5 mg total) by mouth every 12 (twelve) hours as needed for anxiety. (Patient not taking: Reported on 07/11/2018) 14 tablet 0  . Diclofenac Sodium (PENNSAID) 2 % SOLN Place 1 application onto the skin 2 (two) times daily. (Patient not taking: Reported on 07/11/2018) 1 Bottle 3  . escitalopram (LEXAPRO) 20 MG tablet Take 1 tablet (20 mg total) by mouth daily. (Patient not taking: Reported on 07/11/2018) 90 tablet 0  . hydrocortisone 2.5 % lotion Apply topically 2 (two) times daily. (Patient not taking: Reported on 07/11/2018) 59 mL 0  . ibuprofen (ADVIL,MOTRIN) 600 MG tablet Take 1 tablet (600 mg total) by mouth every 6 (six) hours as needed. (Patient not taking: Reported on 07/11/2018) 30 tablet 0  . Ocrelizumab (OCREVUS IV) Inject into the vein. Every 6 months     No current facility-administered medications for this visit.      Musculoskeletal: Strength & Muscle Tone: within normal limits Gait & Station: normal Patient leans: N/A  Psychiatric Specialty Exam: Review of Systems  Psychiatric/Behavioral: Positive for depression. Negative for hallucinations, memory loss, substance abuse and suicidal ideas. The patient is nervous/anxious. The patient does not have insomnia.   All other systems reviewed and are negative.   Blood pressure 108/73,  pulse 82, height 6\' 4"  (1.93 m), weight (!) 352 lb (159.7 kg).Body mass index is 42.85 kg/m.  General Appearance: Fairly Groomed  Eye Contact:  Good  Speech:  Clear and Coherent  Volume:  Normal  Mood:  "good"  Affect:  Appropriate, Congruent and down at times, but reactive  Thought Process:  Coherent  Orientation:  Full (Time, Place, and Person)  Thought Content: Logical   Suicidal Thoughts:  No  Homicidal Thoughts:  No  Memory:  Immediate;   Good  Judgement:  Good  Insight:  Fair  Psychomotor Activity:  Normal  Concentration:  Concentration: Good and Attention Span: Good  Recall:  Good  Fund of Knowledge: Good  Language: Good  Akathisia:  No  Handed:  Right  AIMS (if indicated): not done  Assets:  Communication Skills Desire for Improvement  ADL's:  Intact  Cognition: WNL  Sleep:  Fair   Screenings: GAD-7     Office Visit from 04/11/2017 in [facility] Office Visit from 12/17/2016 in [facility] Counselor from 12/15/2015 in [facility] [place]  Total GAD-7 Score  20  16  13     PHQ2-9     Office Visit from 04/11/2017 in [facility] Office Visit from 12/17/2016 in [facility] Counselor from 12/15/2015 in [facility] [place] Office Visit from 05/24/2015 in [facility] Counselor from 07/06/2014 in [facility] [place]  PHQ-2 Total Score  4  3  1   0  5  PHQ-9 Total Score  17  13  5   -  14       Assessment and Plan:  William Fitzgerald is a 33 y.o. year old male with a history of  Depression, multiple sclerosis (diagnosed in 2015), who presents for follow up appointment for Mild episode of recurrent major depressive disorder (HCC)  Social anxiety disorder  # MDD, mild, recurrent without psychotic features Patient reports overall improvement in depressive symptoms since the last visit, which  coincided with starting to see a lady.  Psychosocial stressors including MS, and loss of his father in February.  He self discontinued Lexapro, and would like to stay off this medication at this time.  He is encouraged to continue to see a therapist.   # r/o ADHD # r/o neurocognitive deficit He continues to struggle with inattention. He has no known history of ADHD, while he has struggled with inattention since high school by patient's report.   Etiology is likely multifactorial given MS, active mood symptoms and marijuana use.  He is planning to schedule appointment for neuropsych evaluation.   # marijuana/CBD use He is motivated to be abstinent from CBD use.  Discussed its potential risk on cognition.  Will continue motivational interview.   Plan I have reviewed and updated plans as below 1. (Hold lexapro; used to be on 20 mg) 2. Return to clinic in three months for 15 mins  Past trials of medication:lexapro,Wellbutrin(fatigue), clonazepam,  The patient demonstrates the following risk factors for suicide: Chronic risk factors for suicide include:psychiatric disorder ofdepression. Acute risk factorsfor suicide include: N/A. Protective factorsfor this patient include: positive social support, coping skills and hope for the future. Considering these factors, the overall suicide risk at this point appears to below. Patientisappropriate for outpatient follow up  Neysa Hotter, MD 07/11/2018, 11:52 AM

## 2018-07-11 ENCOUNTER — Encounter (HOSPITAL_COMMUNITY): Payer: Self-pay | Admitting: Psychiatry

## 2018-07-11 ENCOUNTER — Ambulatory Visit (INDEPENDENT_AMBULATORY_CARE_PROVIDER_SITE_OTHER): Payer: BLUE CROSS/BLUE SHIELD | Admitting: Psychiatry

## 2018-07-11 VITALS — BP 108/73 | HR 82 | Ht 76.0 in | Wt 352.0 lb

## 2018-07-11 DIAGNOSIS — F330 Major depressive disorder, recurrent, mild: Secondary | ICD-10-CM

## 2018-07-11 DIAGNOSIS — F4010 Social phobia, unspecified: Secondary | ICD-10-CM | POA: Diagnosis not present

## 2018-07-11 NOTE — Patient Instructions (Addendum)
1. Hold lexapro 2. Return to clinic in three months for 15 mins

## 2018-07-14 ENCOUNTER — Telehealth: Payer: Self-pay

## 2018-07-14 ENCOUNTER — Encounter (INDEPENDENT_AMBULATORY_CARE_PROVIDER_SITE_OTHER): Payer: Self-pay

## 2018-07-14 ENCOUNTER — Ambulatory Visit (INDEPENDENT_AMBULATORY_CARE_PROVIDER_SITE_OTHER): Payer: BLUE CROSS/BLUE SHIELD | Admitting: Primary Care

## 2018-07-14 ENCOUNTER — Ambulatory Visit (INDEPENDENT_AMBULATORY_CARE_PROVIDER_SITE_OTHER): Payer: BLUE CROSS/BLUE SHIELD | Admitting: Licensed Clinical Social Worker

## 2018-07-14 DIAGNOSIS — F330 Major depressive disorder, recurrent, mild: Secondary | ICD-10-CM | POA: Diagnosis not present

## 2018-07-14 DIAGNOSIS — F4010 Social phobia, unspecified: Secondary | ICD-10-CM | POA: Diagnosis not present

## 2018-07-14 NOTE — Telephone Encounter (Signed)
Dr. Schmitz please advise? 

## 2018-07-14 NOTE — Telephone Encounter (Signed)
Copied from CRM [id number]. Topic: General - Other >> Jul 14, 2018 11:43 AM Marylen Ponto wrote: Reason for CRM: Pt stated he would like a handicapped placard and his pcp informed him that he would need to have Dr. Jordan Likes complete the request for the handicapped placard. Pt requests call back. Cb# [phone]

## 2018-07-15 NOTE — Telephone Encounter (Signed)
Left pt VM to call back, need to inform about handicap placard.

## 2018-07-15 NOTE — Telephone Encounter (Signed)
Pt will bring [place] a form to fill out about this handicap placard.

## 2018-07-18 ENCOUNTER — Other Ambulatory Visit: Payer: Self-pay

## 2018-07-18 ENCOUNTER — Inpatient Hospital Stay: Admission: RE | Admit: 2018-07-18 | Payer: Self-pay | Source: Ambulatory Visit

## 2018-07-18 ENCOUNTER — Ambulatory Visit
Admission: RE | Admit: 2018-07-18 | Discharge: 2018-07-18 | Disposition: A | Discharge status: Home / Self Care | Payer: BLUE CROSS/BLUE SHIELD | Source: Ambulatory Visit | Attending: Family Medicine | Admitting: Family Medicine

## 2018-07-18 DIAGNOSIS — M25561 Pain in right knee: Secondary | ICD-10-CM

## 2018-07-18 MED ORDER — GADOBENATE DIMEGLUMINE 529 MG/ML IV SOLN
20.0000 mL | Freq: Once | INTRAVENOUS | Status: AC | PRN
  Administered 2018-07-18: 20 mL via INTRAVENOUS

## 2018-07-19 ENCOUNTER — Encounter (HOSPITAL_COMMUNITY): Payer: Self-pay | Admitting: Licensed Clinical Social Worker

## 2018-07-19 NOTE — Progress Notes (Signed)
   THERAPIST PROGRESS NOTE  Session Time: 4-5pm  Participation Level: Active  Behavioral Response: Fairly GroomedAlertEuthymic  Type of Therapy: Individual Therapy  Treatment Goals addressed: Coping  Interventions: CBT, Strength-based and Supportive  Summary: William Fitzgerald is a 33 y.o. male who presents with hx of MS, MDD, and Social Anxiety Disorder, for an individual session. He has a form from [facility] asking for this Clinical research associate to provide consent for accommodations for privacy during testing due to anxiety and/or possible ADHD. PT has not made appointment for ADHD testing as discussed in previous session. PT is upbeat and jokes during session. He discusses a new girlfriend he has begun dating who he feels "may be the one". Counselor provides feedback about PT's communication and his identify as "a rebel". PT denies any disabling depression or anxiety sxs. Counselor fills out form requesting PT be allowed to take tests "in a private setting" due to his difficulty focusing when others are around him.   Suicidal/Homicidal: Nowithout intent/plan  Therapist Response: Counselor used open questions, active listening, and reflection of emotions. Counselor helped PT gain insight into his communication style w/ CBT interventions, Socratic questioning.   Plan: Return again in 4 weeks.  Diagnosis:    ICD-10-CM   1. Mild episode of recurrent major depressive disorder (HCC) F33.0   2. Social anxiety disorder F40.10        Margo Common, LCAS-A 07/19/2018

## 2018-07-21 ENCOUNTER — Telehealth: Payer: Self-pay | Admitting: Family Medicine

## 2018-07-21 NOTE — Telephone Encounter (Signed)
Spoke with patient about results.   Myra Rude, MD [facility] 07/21/2018, 9:01 AM

## 2018-08-12 ENCOUNTER — Telehealth: Payer: Self-pay | Admitting: *Deleted

## 2018-08-12 NOTE — Telephone Encounter (Signed)
Patient received Ocrevus infusion on 08/11/2018, [facility] infusion suite. Received fax from [place] [employer] [place]: approval of Ocrevus infusions from 07/06/2018 till 07/15/2019. Total days of service: 2.    Req ID# [id number] Subscriber ID: [id number]  Letter given to Enis Slipper, RN, infusion suite.

## 2018-09-02 ENCOUNTER — Telehealth: Payer: Self-pay

## 2018-09-02 DIAGNOSIS — M25561 Pain in right knee: Secondary | ICD-10-CM

## 2018-09-02 NOTE — Telephone Encounter (Signed)
Copied from CRM [id number]. Topic: General - Other >> Sep 02, 2018  7:38 AM Tamela Oddi wrote: Reason for CRM: Patient called to followup with Dr. Jordan Likes from his visit in February 2020.  Patient stated that the doctor was supposed to have him start therapy for a torn meniscus but he has not heard anything from the doctor.  Please advise because the patient would like to start therapy.  CB# [phone]

## 2018-09-02 NOTE — Telephone Encounter (Signed)
Dr. Schmitz please advise? 

## 2018-09-03 NOTE — Telephone Encounter (Signed)
Spoke with patient about PT. Will send referral and can make appt based on when they are open next.   Myra Rude, MD [facility] 09/03/2018, 11:25 AM

## 2018-09-03 NOTE — Addendum Note (Signed)
Addended by: Myra Rude on: 09/03/2018 11:25 AM   Modules accepted: Orders

## 2018-09-09 ENCOUNTER — Encounter: Payer: Self-pay | Admitting: Physical Therapy

## 2018-09-09 ENCOUNTER — Ambulatory Visit: Payer: BLUE CROSS/BLUE SHIELD | Attending: Family Medicine | Admitting: Physical Therapy

## 2018-09-09 ENCOUNTER — Other Ambulatory Visit: Payer: Self-pay

## 2018-09-09 DIAGNOSIS — M25561 Pain in right knee: Secondary | ICD-10-CM | POA: Insufficient documentation

## 2018-09-09 DIAGNOSIS — M6281 Muscle weakness (generalized): Secondary | ICD-10-CM | POA: Diagnosis present

## 2018-09-09 NOTE — Patient Instructions (Signed)
Access Code: [email or web address]  URL: [email or web address]  Date: 09/09/2018  Prepared by: Army Fossa   Exercises  Seated Long Arc Quad with Hip Adduction - 10 reps - 3 sets - 2s hold - 1x daily - 7x weekly  Seated Hamstring Stretch with Strap - 2 reps - 1 sets - 30s hold - 2x daily - 7x weekly  Clamshell with Resistance - 20 reps - 2 sets - 1x daily - 7x weekly  CLX Ankle Dorsiflexion and Eversion - 15 reps - 2 sets - 1x daily - 7x weekly  Seated Ankle Inversion with Resistance and Legs Crossed - 15 reps - 2 sets - 1x daily - 7x weekly

## 2018-09-09 NOTE — Therapy (Signed)
[facility] Outpatient Rehabilitation [facility] [address] [place], [place], [address] Phone: [phone]   Fax:  [phone]  Physical Therapy Evaluation  Patient Details  Name: William Fitzgerald MRN: [id number] Date of Birth: 1985-12-18 Referring Provider (PT): Myra Rude, MD   Encounter Date: 09/09/2018  PT End of Session - 09/09/18 0807    Visit Number  1    Number of Visits  10    Date for PT Re-Evaluation  10/24/18    Authorization Type  [employer]    PT Start Time  0806    PT Stop Time  0852    PT Time Calculation (min)  46 min    Activity Tolerance  Patient tolerated treatment well    Behavior During Therapy  [facility] for tasks assessed/performed       Past Medical History:  Diagnosis Date  . Anxiety   . Depression   . DVT (deep venous thrombosis) (HCC) 2014  . MS (multiple sclerosis) (HCC)   . MS (multiple sclerosis) (HCC)     Past Surgical History:  Procedure Laterality Date  . DENTAL SURGERY  2016  . VASCULAR SURGERY Right 06/2013    There were no vitals filed for this visit.   Subjective Assessment - 09/09/18 0809    Subjective  Went to have my knee checked a couple of months ago because I was having pain. MRI showed meniscus tear. Difficult to bend my knee and drive. I can bend it but it hurts. Slight back pain also.     How long can you stand comfortably?  about 20 min or so    Currently in Pain?  Yes    Pain Score  7     Pain Location  Knee    Pain Orientation  Left    Pain Descriptors / Indicators  Sharp    Aggravating Factors   standing    Pain Relieving Factors  stretching, ice, take pressure off         [facility] PT Assessment - 09/09/18 0001      Assessment   Medical Diagnosis  acute Rt knee pain    Referring Provider (PT)  Myra Rude, MD    Onset Date/Surgical Date  --   a couple of months ago   Hand Dominance  Right      Precautions   Precaution Comments  Multiple sclerosis      Restrictions   Weight Bearing  Restrictions  No      Balance Screen   Has the patient fallen in the past 6 months  No      Home Environment   Additional Comments  no stairs at home      Prior Function   Vocation Requirements  [employer]      Cognition   Overall Cognitive Status  Within Functional Limits for tasks assessed      Sensation   Additional Comments  [facility]      Posture/Postural Control   Posture Comments  knee valgus with ankle prontation      ROM / Strength   AROM / PROM / Strength  AROM;PROM;Strength      AROM   AROM Assessment Site  Knee    Right/Left Knee  Right    Right Knee Extension  0    Right Knee Flexion  74      PROM   PROM Assessment Site  Knee    Right/Left Knee  Right    Right Knee  Extension  0    Right Knee Flexion  --   too guarded to push past AROM     Strength   Overall Strength Comments  knee 5/5    Strength Assessment Site  Hip    Right/Left Hip  Right    Right Hip ABduction  4/5      Ambulation/Gait   Gait Comments  medial heel whip, pronation after heel strike, bil trendelenburg                Objective measurements completed on examination: See above findings.      [facility] Adult PT Treatment/Exercise - 09/09/18 0001      Exercises   Exercises  Knee/Hip      Knee/Hip Exercises: Stretches   Passive Hamstring Stretch Limitations  seated EOB      Knee/Hip Exercises: Seated   Long Arc Quad Limitations  with ball bw knees    Other Seated Knee/Hip Exercises  ankle inversion, eversion red tband      Knee/Hip Exercises: Sidelying   Clams  green tband at knees             PT Education - 09/09/18 0916    Education Details  anatomy of condition, POC, HEP, exercise form/rationale, shoes, knee brace    Person(s) Educated  Patient    Methods  Explanation;Demonstration;Tactile cues;Verbal cues;Handout    Comprehension  Verbalized understanding;Need further instruction;Returned demonstration;Verbal cues required;Tactile cues required        PT Short Term Goals - 09/09/18 0909      PT SHORT TERM GOAL #1   Title  pt will be aware of poor alignment habits and able to correct    Baseline  began educating    Time  2    Period  Weeks    Status  New    Target Date  09/26/18      PT SHORT TERM GOAL #2   Title  Pt will be able to utilize stretches through the day to control knee pain    Baseline  began educating at eval    Time  2    Period  Weeks    Status  New    Target Date  09/26/18        PT Long Term Goals - 09/09/18 0910      PT LONG TERM GOAL #1   Title  Pt will be able to stand for work activities pain <=2/10    Baseline  7/10 at rest at eval    Time  6    Period  Weeks    Status  New    Target Date  10/24/18      PT LONG TERM GOAL #2   Title  hip abd strength to 5/5    Baseline  4/5 at eval    Time  6    Period  Weeks    Status  New    Target Date  10/24/18      PT LONG TERM GOAL #3   Title  pt will be able to flex Rt knee actively equal to Lt    Baseline  see flowsheet, Lt [employer]    Time  6    Period  Weeks    Status  New    Target Date  10/24/18      PT LONG TERM GOAL #4   Title  Pt will demo proper posture through LE biomechanical chain when squatting and climbing stairs    Baseline  unable due to pain at eval    Time  6    Period  Weeks    Status  New    Target Date  10/24/18             Plan - 09/09/18 0854    Clinical Impression Statement  Pt presents to PT with complaints of Rt knee pain that began a couple of months ago of insidious onset. Lateral patellar tracking noted and MRI revealed tear in anterior horn of lateral meniscus. Weakness in hip abductors with pronation in weight bearing- provided with shoe description sheet to [employer] and discussed importance of support. Decreased pain with mcconnel taping. Advised not to wear a knee brace at this time due to alignment. pt will benefit from skilled PT in order to improve postural alignment and decrease knee pain.      Personal Factors and Comorbidities  Comorbidity 1    Comorbidities  MS    Examination-Activity Limitations  Squat;Stairs;Stand;Sit    Examination-Participation Restrictions  Cleaning;Meal Prep;Driving;Community Activity    Stability/Clinical Decision Making  Stable/Uncomplicated    Clinical Decision Making  Low    Rehab Potential  Good    PT Frequency  --   1/week 3 weeks, 2/week following   PT Duration  6 weeks    PT Treatment/Interventions  ADLs/Self Care Home Management;Cryotherapy;Ultrasound;Moist Heat;Electrical Stimulation;Gait training;Stair training;Functional mobility training;Therapeutic activities;Patient/family education;Neuromuscular re-education;Therapeutic exercise;Manual techniques;Passive range of motion;Taping    PT Next Visit Plan  replace mcconnel tape. bridge with clam, cont hip abd strength    PT Home Exercise Plan  ankle inversion/eversion tband, HSS, clam    Consulted and Agree with Plan of Care  Patient       Patient will benefit from skilled therapeutic intervention in order to improve the following deficits and impairments:  Abnormal gait, Improper body mechanics, Pain, Postural dysfunction, Increased muscle spasms, Decreased activity tolerance, Decreased range of motion, Decreased strength, Impaired flexibility, Difficulty walking  Visit Diagnosis: Acute pain of right knee - Plan: PT plan of care cert/re-cert  Muscle weakness (generalized) - Plan: PT plan of care cert/re-cert     Problem List Patient Active Problem List   Diagnosis Date Noted  . Acute pain of right knee 06/20/2018  . Major depressive disorder, recurrent episode (HCC) 07/06/2014  . Social anxiety disorder 07/06/2014  . Multiple sclerosis (HCC) 08/24/2013  . Nonspecific (abnormal) findings on radiological and other examination of skull and head 08/24/2013  . Disturbance of skin sensation 08/24/2013  . Other malaise and fatigue 08/24/2013  . Spasm of muscle 08/24/2013   Math Brazie C.  Tyarra Nolton PT, DPT 09/09/18 9:18 AM   [facility] [address] [place], [place], [address] Phone: [phone]   Fax:  [phone]  Name: NIKOLAY DUDIK MRN: [id number] Date of Birth: 1985/07/30

## 2018-09-15 ENCOUNTER — Ambulatory Visit: Payer: BLUE CROSS/BLUE SHIELD | Admitting: Physical Therapy

## 2018-09-15 ENCOUNTER — Encounter (HOSPITAL_COMMUNITY): Payer: Self-pay | Admitting: Licensed Clinical Social Worker

## 2018-09-15 ENCOUNTER — Encounter: Payer: Self-pay | Admitting: Physical Therapy

## 2018-09-15 ENCOUNTER — Ambulatory Visit (INDEPENDENT_AMBULATORY_CARE_PROVIDER_SITE_OTHER): Payer: BLUE CROSS/BLUE SHIELD | Admitting: Licensed Clinical Social Worker

## 2018-09-15 ENCOUNTER — Other Ambulatory Visit: Payer: Self-pay

## 2018-09-15 DIAGNOSIS — M6281 Muscle weakness (generalized): Secondary | ICD-10-CM

## 2018-09-15 DIAGNOSIS — F411 Generalized anxiety disorder: Secondary | ICD-10-CM

## 2018-09-15 DIAGNOSIS — M25561 Pain in right knee: Secondary | ICD-10-CM | POA: Diagnosis not present

## 2018-09-15 DIAGNOSIS — F909 Attention-deficit hyperactivity disorder, unspecified type: Secondary | ICD-10-CM

## 2018-09-15 NOTE — Therapy (Signed)
[facility] Outpatient Rehabilitation [facility] [address] [place], [place], [address] Phone: [phone]   Fax:  [phone]  Physical Therapy Treatment  Patient Details  Name: William Fitzgerald MRN: [id number] Date of Birth: 08-20-1985 Referring Provider (PT): Myra Rude, MD   Encounter Date: 09/15/2018  PT End of Session - 09/15/18 0946    Visit Number  2    Number of Visits  10    Date for PT Re-Evaluation  10/24/18    Authorization Type  BCBS    PT Start Time  0902    PT Stop Time  0943    PT Time Calculation (min)  41 min    Activity Tolerance  Patient tolerated treatment well;Patient limited by pain    Behavior During Therapy  [facility] for tasks assessed/performed       Past Medical History:  Diagnosis Date  . Anxiety   . Depression   . DVT (deep venous thrombosis) (HCC) 2014  . MS (multiple sclerosis) (HCC)   . MS (multiple sclerosis) (HCC)     Past Surgical History:  Procedure Laterality Date  . DENTAL SURGERY  2016  . VASCULAR SURGERY Right 06/2013    There were no vitals filed for this visit.  Subjective Assessment - 09/15/18 0904    Subjective  I have been having a lot of sharp pain, yesterday in particular. Pain with hip abduction. Been trying to notice when I am sitting with my foot out. Removed tape, felt better with it on.     Currently in Pain?  Yes    Pain Score  3     Pain Location  Knee    Pain Orientation  Right    Pain Descriptors / Indicators  Aching    Aggravating Factors   bending    Pain Relieving Factors  sit         [facility] PT Assessment - 09/15/18 0001      Assessment   Medical Diagnosis  acute Rt knee pain    Referring Provider (PT)  Myra Rude, MD    Next MD Visit  PRN                   [facility] Adult PT Treatment/Exercise - 09/15/18 0001      Knee/Hip Exercises: Stretches   Gastroc Stretch  Both;2 reps;30 seconds      Knee/Hip Exercises: Aerobic   Stationary Bike  5 min tolerable range    fearful     Knee/Hip Exercises: Supine   Short Arc [employer]  15 reps;Right    Short Arc Quad Sets Limitations  3s holds      Knee/Hip Exercises: Sidelying   Clams  X30 Right      Modalities   Modalities  Ultrasound      Ultrasound   Ultrasound Location  Rt knee medial joint line    Ultrasound Parameters  8 min pulsed 1.0    Ultrasound Goals  Pain      Manual Therapy   Manual Therapy  Taping    McConnell  medial patellar pull Rt   reapplied end of session            PT Education - 09/15/18 1004    Education Details  bike riding to reduce pressure through knee, aquatics    Person(s) Educated  Patient    Methods  Explanation    Comprehension  Verbalized understanding;Need further instruction       PT Short Term  Goals - 09/09/18 0909      PT SHORT TERM GOAL #1   Title  pt will be aware of poor alignment habits and able to correct    Baseline  began educating    Time  2    Period  Weeks    Status  New    Target Date  09/26/18      PT SHORT TERM GOAL #2   Title  Pt will be able to utilize stretches through the day to control knee pain    Baseline  began educating at eval    Time  2    Period  Weeks    Status  New    Target Date  09/26/18        PT Long Term Goals - 09/09/18 0910      PT LONG TERM GOAL #1   Title  Pt will be able to stand for work activities pain <=2/10    Baseline  7/10 at rest at eval    Time  6    Period  Weeks    Status  New    Target Date  10/24/18      PT LONG TERM GOAL #2   Title  hip abd strength to 5/5    Baseline  4/5 at eval    Time  6    Period  Weeks    Status  New    Target Date  10/24/18      PT LONG TERM GOAL #3   Title  pt will be able to flex Rt knee actively equal to Lt    Baseline  see flowsheet, Lt [facility]    Time  6    Period  Weeks    Status  New    Target Date  10/24/18      PT LONG TERM GOAL #4   Title  Pt will demo proper posture through LE biomechanical chain when squatting and climbing stairs     Baseline  unable due to pain at eval    Time  6    Period  Weeks    Status  New    Target Date  10/24/18            Plan - 09/15/18 1002    Clinical Impression Statement  Pt is very fearful of bending knee due to pain, began quickly on bike which hurt and then was very conservative in ROM. [place] utilized in attempt to decrease irritation after bike. Tape re-applied for medial patellar glide at the end of the session because it was pulled off by pants leg earlier in session. Pt reported feeling "alright" after session. Will work on form with clams to keep work in hip.     PT Treatment/Interventions  ADLs/Self Care Home Management;Cryotherapy;Ultrasound;Moist Heat;Electrical Stimulation;Gait training;Stair training;Functional mobility training;Therapeutic activities;Patient/family education;Neuromuscular re-education;Therapeutic exercise;Manual techniques;Passive range of motion;Taping    PT Next Visit Plan  cont mcconnell tape, glut strengthening    PT Home Exercise Plan  ankle inversion/eversion tband, HSS, clam    Consulted and Agree with Plan of Care  Patient       Patient will benefit from skilled therapeutic intervention in order to improve the following deficits and impairments:  Abnormal gait, Improper body mechanics, Pain, Postural dysfunction, Increased muscle spasms, Decreased activity tolerance, Decreased range of motion, Decreased strength, Impaired flexibility, Difficulty walking  Visit Diagnosis: Acute pain of right knee  Muscle weakness (generalized)     Problem List Patient Active Problem  List   Diagnosis Date Noted  . Acute pain of right knee 06/20/2018  . Major depressive disorder, recurrent episode (HCC) 07/06/2014  . Social anxiety disorder 07/06/2014  . Multiple sclerosis (HCC) 08/24/2013  . Nonspecific (abnormal) findings on radiological and other examination of skull and head 08/24/2013  . Disturbance of skin sensation 08/24/2013  . Other malaise and  fatigue 08/24/2013  . Spasm of muscle 08/24/2013    Fraida Veldman C. Veronika Heard PT, DPT 09/15/18 10:06 AM   [facility] [address] [place], [place], [address] Phone: [phone]   Fax:  [phone]  Name: Myles RosenthalJustin D Hardiman MRN: [id number] Date of Birth: [birth year]

## 2018-09-15 NOTE — Progress Notes (Signed)
Virtual Visit via Video Note  I connected with William Fitzgerald on 09/15/18 at 11:00 AM EDT by a video enabled telemedicine application and verified that I am speaking with the correct person using two identifiers.  Location: Patient: Home Provider: Office   I discussed the limitations of evaluation and management by telemedicine and the availability of in person appointments. The patient expressed understanding and agreed to proceed.      I discussed the assessment and treatment plan with the patient. The patient was provided an opportunity to ask questions and all were answered. The patient agreed with the plan and demonstrated an understanding of the instructions.   The patient was advised to call back or seek an in-person evaluation if the symptoms worsen or if the condition fails to improve as anticipated.  I provided 52 minutes of non-face-to-face time during this encounter.   Margo Common, LCAS-A    THERAPIST PROGRESS NOTE  Session Time: 11-12  Participation Level: Active  Behavioral Response: Neat and Well GroomedAlertEuthymic  Type of Therapy: Individual Therapy  Treatment Goals addressed: Anxiety  Interventions: CBT and Motivational Interviewing  Summary: William Fitzgerald is a 33 y.o. male who presents with hx of Anxiety and MDD. He states his depression has been "getting better", he is comfortable w/ social distancing, and continues to work at [employer]. He wants to discuss a cookout he hosted for his family this past weekend in which he "took charge". He asks questions about how to best handle his relationship w/ his girlfriend whom he wants to break up w/ because she "wants to push him too much". I discussed the pros and cons of letting others get to know [place] better.    Suicidal/Homicidal: Nowithout intent/plan  Therapist Response: I used open questions, active listening, and reflections. I used supportive Socratic questioning to help PT identify his options  and meaning behind recent decisions to withhold information from his girlfriend.  Plan: Return again in 4 weeks.  Diagnosis:    ICD-10-CM   1. Generalized anxiety disorder F41.1   2. Attention deficit hyperactivity disorder (ADHD), unspecified ADHD type F90.9      Margo Common, LCAS-A 09/15/2018

## 2018-09-22 ENCOUNTER — Ambulatory Visit: Payer: BLUE CROSS/BLUE SHIELD | Admitting: Physical Therapy

## 2018-09-22 ENCOUNTER — Other Ambulatory Visit: Payer: Self-pay

## 2018-09-22 DIAGNOSIS — M25561 Pain in right knee: Secondary | ICD-10-CM

## 2018-09-22 DIAGNOSIS — M6281 Muscle weakness (generalized): Secondary | ICD-10-CM

## 2018-09-22 NOTE — Therapy (Signed)
[facility] Outpatient Rehabilitation [facility] [address] [place], [place], [address] Phone: [phone]   Fax:  [phone]  Physical Therapy Treatment  Patient Details  Name: William Fitzgerald MRN: [id number] Date of Birth: 08-23-1985 Referring Provider (PT): Myra Rude, MD   Encounter Date: 09/22/2018  PT End of Session - 09/22/18 0859    Visit Number  3    Number of Visits  10    Date for PT Re-Evaluation  10/24/18    Authorization Type  BCBS    PT Start Time  0848    PT Stop Time  0929    PT Time Calculation (min)  41 min    Activity Tolerance  Patient tolerated treatment well    Behavior During Therapy  [facility] for tasks assessed/performed       Past Medical History:  Diagnosis Date  . Anxiety   . Depression   . DVT (deep venous thrombosis) (HCC) 2014  . MS (multiple sclerosis) (HCC)   . MS (multiple sclerosis) (HCC)     Past Surgical History:  Procedure Laterality Date  . DENTAL SURGERY  2016  . VASCULAR SURGERY Right 06/2013    There were no vitals filed for this visit.  Subjective Assessment - 09/22/18 0900    Subjective  "I can tell I am getting better, it isn't as sore when I walk but I still have stiffness with bending the knee"    Currently in Pain?  Yes    Pain Score  3     Pain Location  Knee    Pain Orientation  Right    Pain Frequency  Intermittent    Aggravating Factors   bending and stairs                       [facility] Adult PT Treatment/Exercise - 09/22/18 0001      Self-Care   Self-Care  Other Self-Care Comments    Other Self-Care Comments   provided heel lift in the R shoe due to visual LLD with LLE being long compared bil      Knee/Hip Exercises: Stretches   Lobbyist  2 reps;30 seconds    Gastroc Stretch  Both;2 reps;30 seconds      Knee/Hip Exercises: Aerobic   Stationary Bike  5 min 1/2 revolutions      Knee/Hip Exercises: Machines for Strengthening   Cybex Leg Press  40# bil       Knee/Hip Exercises: Standing   Step Down  2 sets;10 reps;Step Height: 6"   focusing on keeping knee in line with foot    Wall Squat  1 set;10 reps   with ball between the knees   Wall Squat Limitations  compensated with LLE rotation due to LLD    Stairs  up/down 6 inch steps x 10 focusing on form      Knee/Hip Exercises: Supine   Quad Sets  1 set;15 reps      Knee/Hip Exercises: Sidelying   Hip ABduction  1 set;Strengthening;Right   going to fatigue     Manual Therapy   Manual therapy comments  MTPR along the Vastus lateralis x 3    McConnell  L > M on the R               PT Short Term Goals - 09/09/18 0909      PT SHORT TERM GOAL #1   Title  pt will be aware of poor alignment habits  and able to correct    Baseline  began educating    Time  2    Period  Weeks    Status  New    Target Date  09/26/18      PT SHORT TERM GOAL #2   Title  Pt will be able to utilize stretches through the day to control knee pain    Baseline  began educating at eval    Time  2    Period  Weeks    Status  New    Target Date  09/26/18        PT Long Term Goals - 09/09/18 0910      PT LONG TERM GOAL #1   Title  Pt will be able to stand for work activities pain <=2/10    Baseline  7/10 at rest at eval    Time  6    Period  Weeks    Status  New    Target Date  10/24/18      PT LONG TERM GOAL #2   Title  hip abd strength to 5/5    Baseline  4/5 at eval    Time  6    Period  Weeks    Status  New    Target Date  10/24/18      PT LONG TERM GOAL #3   Title  pt will be able to flex Rt knee actively equal to Lt    Baseline  see flowsheet, Lt [facility]    Time  6    Period  Weeks    Status  New    Target Date  10/24/18      PT LONG TERM GOAL #4   Title  Pt will demo proper posture through LE biomechanical chain when squatting and climbing stairs    Baseline  unable due to pain at eval    Time  6    Period  Weeks    Status  New    Target Date  10/24/18            Plan -  09/22/18 0929    Clinical Impression Statement  Continued applying McConnel taping. pt demonstrates bil genu valgum and LLD with R being shorter than the left. provided heel lift which pt noted significant improvement in pain. HE performed stair trainig and strengthening well demonstrating compensation due to R knee stiffness and possibly LLD.     PT Next Visit Plan  cont mcconnell tape, glut strengthening, hows heel lift    PT Home Exercise Plan  ankle inversion/eversion tband, HSS, clam    Consulted and Agree with Plan of Care  Patient       Patient will benefit from skilled therapeutic intervention in order to improve the following deficits and impairments:     Visit Diagnosis: Acute pain of right knee  Muscle weakness (generalized)     Problem List Patient Active Problem List   Diagnosis Date Noted  . Acute pain of right knee 06/20/2018  . Major depressive disorder, recurrent episode (HCC) 07/06/2014  . Social anxiety disorder 07/06/2014  . Multiple sclerosis (HCC) 08/24/2013  . Nonspecific (abnormal) findings on radiological and other examination of skull and head 08/24/2013  . Disturbance of skin sensation 08/24/2013  . Other malaise and fatigue 08/24/2013  . Spasm of muscle 08/24/2013   Lulu Riding PT, DPT, LAT, ATC  09/22/18  9:32 AM      [facility] Outpatient [facility] [address]  [address] [place], [place], [address] Phone: [phone]   Fax:  [phone]  Name: ERUBEY SUPPES MRN: [id number] Date of Birth: Jan 27, 1986

## 2018-09-30 ENCOUNTER — Other Ambulatory Visit: Payer: Self-pay

## 2018-09-30 ENCOUNTER — Ambulatory Visit: Payer: BLUE CROSS/BLUE SHIELD

## 2018-09-30 DIAGNOSIS — M25561 Pain in right knee: Secondary | ICD-10-CM | POA: Diagnosis not present

## 2018-09-30 DIAGNOSIS — M6281 Muscle weakness (generalized): Secondary | ICD-10-CM

## 2018-09-30 NOTE — Therapy (Addendum)
[facility] Outpatient Rehabilitation [facility] [address] [place], [place], [address] Phone: [phone]   Fax:  [phone]  Physical Therapy Treatment  Patient Details  Name: William Fitzgerald MRN: [id number] Date of Birth: 1985/05/25 Referring Provider (PT): Myra Rude, MD   Encounter Date: 09/30/2018  PT End of Session - 09/30/18 0847    Visit Number  4    Number of Visits  10    Date for PT Re-Evaluation  10/24/18    Authorization Type  BCBS    PT Start Time  0845    PT Stop Time  0930    PT Time Calculation (min)  45 min    Activity Tolerance  Patient tolerated treatment well    Behavior During Therapy  [facility] for tasks assessed/performed       Past Medical History:  Diagnosis Date  . Anxiety   . Depression   . DVT (deep venous thrombosis) (HCC) 2014  . MS (multiple sclerosis) (HCC)   . MS (multiple sclerosis) (HCC)     Past Surgical History:  Procedure Laterality Date  . DENTAL SURGERY  2016  . VASCULAR SURGERY Right 06/2013    There were no vitals filed for this visit.  Subjective Assessment - 09/30/18 0849    Pain Score  3     Pain Location  Knee    Pain Orientation  Right    Pain Descriptors / Indicators  Aching    Pain Frequency  Intermittent    Aggravating Factors   bending , stairs    Pain Relieving Factors  rest                       [facility] Adult PT Treatment/Exercise - 09/30/18 0001      Knee/Hip Exercises: Aerobic   Stationary Bike  6 min 1/2 revolutions      Knee/Hip Exercises: Standing   Forward Step Up  Right;15 reps;Hand Hold: 1;Step Height: 6"    Forward Step Up Limitations  cued to let hip drop back keeping lower leg with tactile cue vertical.     Step Down  Right;3 sets;10 reps;Hand Hold: 1;Step Height: 6"    Wall Squat  15 reps    Wall Squat Limitations  good allignment with cue to limit depth of squat      Knee/Hip Exercises: Supine   Quad Sets  1 set;15 reps    Quad Sets Limitations  55-10  sec hold    Short Arc [employer]  15 reps;Right    Short Arc Quad Sets Limitations  5 sec hold      Knee/Hip Exercises: Sidelying   Hip ABduction  Right    Clams  X30 Right      Manual Therapy   McConnell  L > M on the R               PT Short Term Goals - 09/30/18 0850      PT SHORT TERM GOAL #1   Title  pt will be aware of poor alignment habits and able to correct    Baseline  does not remenber any instructions    Status  On-going      PT SHORT TERM GOAL #2   Title  Pt will be able to utilize stretches through the day to control knee pain    Baseline  Does as needed every hour    Status  Achieved        PT  Long Term Goals - 09/30/18 0851      PT LONG TERM GOAL #1   Title  Pt will be able to stand for work activities pain <=2/10    Baseline  8-9 depending on activity.  Longest time on feet before incr pain is a 2-3 hours.     Status  On-going      PT LONG TERM GOAL #2   Title  hip abd strength to 5/5    Status  On-going      PT LONG TERM GOAL #3   Title  pt will be able to flex Rt knee actively equal to Lt    Baseline  88 degrees today    Status  On-going      PT LONG TERM GOAL #4   Title  Pt will demo proper posture through LE biomechanical chain when squatting and climbing stairs    Baseline  unable due to pain at eval            Plan - 09/30/18 0847    Clinical Impression Statement  Tolerating exercises without increased pain but ROM still very limitied though better than at eval. .    PT Treatment/Interventions  ADLs/Self Care Home Management;Cryotherapy;Ultrasound;Moist Heat;Electrical Stimulation;Gait training;Stair training;Functional mobility training;Therapeutic activities;Patient/family education;Neuromuscular re-education;Therapeutic exercise;Manual techniques;Passive range of motion;Taping    PT Next Visit Plan  cont mcconnell tape, glut strengthening, hows heel lift    PT Home Exercise Plan  ankle inversion/eversion tband, HSS, clam     Consulted and Agree with Plan of Care  Patient       Patient will benefit from skilled therapeutic intervention in order to improve the following deficits and impairments:  Abnormal gait, Improper body mechanics, Pain, Postural dysfunction, Increased muscle spasms, Decreased activity tolerance, Decreased range of motion, Decreased strength, Impaired flexibility, Difficulty walking  Visit Diagnosis: Acute pain of right knee  Muscle weakness (generalized)     Problem List Patient Active Problem List   Diagnosis Date Noted  . Acute pain of right knee 06/20/2018  . Major depressive disorder, recurrent episode (HCC) 07/06/2014  . Social anxiety disorder 07/06/2014  . Multiple sclerosis (HCC) 08/24/2013  . Nonspecific (abnormal) findings on radiological and other examination of skull and head 08/24/2013  . Disturbance of skin sensation 08/24/2013  . Other malaise and fatigue 08/24/2013  . Spasm of muscle 08/24/2013    Caprice Red  PT 09/30/2018, 11:17 AM  [facility] [address] [place], [place], [address] Phone: [phone]   Fax:  [phone]  Name: NAFIS GRESS MRN: [id number] Date of Birth: [birth year]

## 2018-10-01 NOTE — Progress Notes (Deleted)
BH MD/PA/NP OP Progress Note  10/01/2018 3:39 PM William Fitzgerald  MRN:  [id number]  Chief Complaint:  HPI: *** Visit Diagnosis: No diagnosis found.  Past Psychiatric History: Please see initial evaluation for full details. I have reviewed the history. No updates at this time.     Past Medical History:  Past Medical History:  Diagnosis Date  . Anxiety   . Depression   . DVT (deep venous thrombosis) (HCC) 2014  . MS (multiple sclerosis) (HCC)   . MS (multiple sclerosis) (HCC)     Past Surgical History:  Procedure Laterality Date  . DENTAL SURGERY  2016  . VASCULAR SURGERY Right 06/2013    Family Psychiatric History: Please see initial evaluation for full details. I have reviewed the history. No updates at this time.     Family History:  Family History  Problem Relation Age of Onset  . Seizures Other   . Lung cancer Maternal Aunt   . Diabetes Maternal Grandfather   . Lung cancer Maternal Aunt   . Schizophrenia Cousin   . ADD / ADHD Cousin     Social History:  Social History   Socioeconomic History  . Marital status: Single    Spouse name: Not on file  . Number of children: 0  . Years of education: 12th  . Highest education level: Not on file  Occupational History    Employer: [employer]     Comment: [employer]  Social Needs  . Financial resource strain: Not on file  . Food insecurity:    Worry: Not on file    Inability: Not on file  . Transportation needs:    Medical: Not on file    Non-medical: Not on file  Tobacco Use  . Smoking status: Former Smoker    Types: Cigarettes  . Smokeless tobacco: Never Used  . Tobacco comment: Smokes a cigarette every once in awhile   Substance and Sexual Activity  . Alcohol use: Yes    Alcohol/week: 0.0 standard drinks    Comment: Reports a beer or glass of wine every other week.   . Drug use: No    Comment: quit marijuana 2014  . Sexual activity: Not Currently    Birth control/protection: Condom   Lifestyle  . Physical activity:    Days per week: Not on file    Minutes per session: Not on file  . Stress: Not on file  Relationships  . Social connections:    Talks on phone: Not on file    Gets together: Not on file    Attends religious service: Not on file    Active member of club or organization: Not on file    Attends meetings of clubs or organizations: Not on file    Relationship status: Not on file  Other Topics Concern  . Not on file  Social History Narrative   Patient lives at home with family.   Caffeine Use: tea occasionally    Allergies:  Allergies  Allergen Reactions  . Gadolinium Derivatives Nausea Only    Pt became nauseous after contrast    Metabolic Disorder Labs: No results found for: HGBA1C, MPG No results found for: PROLACTIN No results found for: CHOL, TRIG, HDL, CHOLHDL, VLDL, LDLCALC No results found for: TSH  Therapeutic Level Labs: No results found for: LITHIUM No results found for: VALPROATE No components found for:  CBMZ  Current Medications: Current Outpatient Medications  Medication Sig Dispense Refill  . Cholecalciferol (VITAMIN D) 2000  units CAPS Take 2,000 Units by mouth daily.    . clonazePAM (KLONOPIN) 1 MG tablet Take 1 tablet (1 mg total) by mouth at bedtime. (Patient not taking: Reported on 07/11/2018) 30 tablet 0  . cyclobenzaprine (FLEXERIL) 10 MG tablet Take 1 tablet (10 mg total) by mouth 3 (three) times daily as needed for muscle spasms. (Patient not taking: Reported on 07/11/2018) 20 tablet 0  . diazepam (VALIUM) 5 MG tablet Take 1 tablet (5 mg total) by mouth every 12 (twelve) hours as needed for anxiety. (Patient not taking: Reported on 07/11/2018) 14 tablet 0  . Diclofenac Sodium (PENNSAID) 2 % SOLN Place 1 application onto the skin 2 (two) times daily. (Patient not taking: Reported on 07/11/2018) 1 Bottle 3  . escitalopram (LEXAPRO) 20 MG tablet Take 1 tablet (20 mg total) by mouth daily. (Patient not taking: Reported on  07/11/2018) 90 tablet 0  . hydrocortisone 2.5 % lotion Apply topically 2 (two) times daily. (Patient not taking: Reported on 07/11/2018) 59 mL 0  . ibuprofen (ADVIL,MOTRIN) 600 MG tablet Take 1 tablet (600 mg total) by mouth every 6 (six) hours as needed. (Patient not taking: Reported on 07/11/2018) 30 tablet 0  . Ocrelizumab (OCREVUS IV) Inject into the vein. Every 6 months     No current facility-administered medications for this visit.      Musculoskeletal: Strength & Muscle Tone: N/A Gait & Station: N/A Patient leans: N/A  Psychiatric Specialty Exam: ROS  There were no vitals taken for this visit.There is no height or weight on file to calculate BMI.  General Appearance: {Appearance:22683}  Eye Contact:  {BHH EYE CONTACT:22684}  Speech:  Clear and Coherent  Volume:  Normal  Mood:  {BHH MOOD:22306}  Affect:  {Affect (PAA):22687}  Thought Process:  Coherent  Orientation:  Full (Time, Place, and Person)  Thought Content: Logical   Suicidal Thoughts:  {ST/HT (PAA):22692}  Homicidal Thoughts:  {ST/HT (PAA):22692}  Memory:  Immediate;   Good  Judgement:  {Judgement (PAA):22694}  Insight:  {Insight (PAA):22695}  Psychomotor Activity:  Normal  Concentration:  Concentration: Good and Attention Span: Good  Recall:  Good  Fund of Knowledge: Good  Language: Good  Akathisia:  No  Handed:  Right  AIMS (if indicated): not done  Assets:  Communication Skills Desire for Improvement  ADL's:  Intact  Cognition: WNL  Sleep:  {B[facility] GOOD/FAIR/POOR:22877}   Screenings: GAD-7     Office Visit from 04/11/2017 in [facility] Office Visit from 12/17/2016 in [facility] Counselor from 12/15/2015 in [facility] [place]  Total GAD-7 Score  20  16  13     PHQ2-9     Office Visit from 04/11/2017 in [facility] Office Visit from 12[facility]17/2016 in [facility] Counselor from 12/15/2015 in  [facility] [place] Office Visit from 05/24/2015 in [facility] Counselor from 07/06/2014 in [facility] [place]  PHQ-2 Total Score  4  3  1   0  5  PHQ-9 Total Score  17  13  5   -  14       Assessment and Plan:  William Fitzgerald is a 33 y.o. year old male with a history of depression, multiple sclerosis (diagnosed in 2015), who presents for follow up appointment for No diagnosis found.   # MDD, mild, recurrent without psychotic features  Patient reports overall improvement in depressive symptoms since the last visit,  which coincided with starting to see a lady.  Psychosocial stressors including MS, and loss of his father in February.  He self discontinued Lexapro, and would like to stay off this medication at this time.  He is encouraged to continue to see a therapist.   #r/o ADHD # r/o neurocognitive deficit He continues to struggle with inattention.He has no known history of ADHD,while he has struggled with inattention since high school by patient's report.  Etiology is likely multifactorial given MS, active mood symptoms and marijuana use.  He is planning to schedule appointment for neuropsych evaluation.   #marijuana/CBD use He is motivated to be abstinent from CBD use.  Discussed its potential risk on cognition.  Will continue motivational interview.   Plan  1. (Hold lexapro; used to be on 20 mg) 2. Return to clinic in three months for 15 mins  Past trials of medication:lexapro,Wellbutrin(fatigue), clonazepam,  The patient demonstrates the following risk factors for suicide: Chronic risk factors for suicide include:psychiatric disorder ofdepression. Acute risk factorsfor suicide include: N/A. Protective factorsfor this patient include: positive social support, coping skills and hope for the future. Considering these factors, the overall suicide risk at this point appears to below.  Patientisappropriate for outpatient follow up  Neysa Hotter, MD 10/01/2018, 3:39 PM

## 2018-10-06 ENCOUNTER — Encounter: Payer: Self-pay | Admitting: Physical Therapy

## 2018-10-06 ENCOUNTER — Other Ambulatory Visit: Payer: Self-pay

## 2018-10-06 ENCOUNTER — Ambulatory Visit: Payer: BC Managed Care – PPO | Attending: Family Medicine | Admitting: Physical Therapy

## 2018-10-06 DIAGNOSIS — M6281 Muscle weakness (generalized): Secondary | ICD-10-CM | POA: Insufficient documentation

## 2018-10-06 DIAGNOSIS — M545 Low back pain: Secondary | ICD-10-CM | POA: Insufficient documentation

## 2018-10-06 DIAGNOSIS — M25561 Pain in right knee: Secondary | ICD-10-CM | POA: Diagnosis present

## 2018-10-06 NOTE — Therapy (Signed)
[facility] [address] [place], [place], [address] Phone: [phone]   Fax:  [phone]  Physical Therapy Treatment  Patient Details  Name: Myles RosenthalJustin D Cavan MRN: [id number] Date of Birth: 1986/01/20 Referring Provider (PT): Myra RudeJeremy E Schmitz, MD   Encounter Date: 10/06/2018  PT End of Session - 10/06/18 1100    Visit Number  5    Number of Visits  10    Date for PT Re-Evaluation  10/24/18    Authorization Type  [employer]    PT Start Time  1100    PT Stop Time  1145    PT Time Calculation (min)  45 min    Activity Tolerance  Patient tolerated treatment well    Behavior During Therapy  [facility] for tasks assessed/performed       Past Medical History:  Diagnosis Date  . Anxiety   . Depression   . DVT (deep venous thrombosis) (HCC) 2014  . MS (multiple sclerosis) (HCC)   . MS (multiple sclerosis) (HCC)     Past Surgical History:  Procedure Laterality Date  . DENTAL SURGERY  2016  . VASCULAR SURGERY Right 06/2013    There were no vitals filed for this visit.  Subjective Assessment - 10/06/18 1100    Subjective  "I am doing ok, I am still feeling tightness/ pain inthe knee on the outside of the knee"    Currently in Pain?  Yes    Pain Score  5     Pain Orientation  Right    Pain Type  Chronic pain    Pain Onset  More than a month ago    Pain Frequency  Intermittent         [facility] PT Assessment - 10/06/18 0001      Assessment   Medical Diagnosis  acute Rt knee pain    Referring Provider (PT)  Myra RudeJeremy E Schmitz, MD                   [facility]/Exercise - 10/06/18 1106      Exercises   Exercises  Ankle      Knee/Hip Exercises: Stretches   Quad Stretch  2 reps;30 seconds    Gastroc Stretch  Both;2 reps;30 seconds      Knee/Hip Exercises: Aerobic   Stationary Bike  6 min 1/2 revolutions      Knee/Hip Exercises: Seated   Long Arc Quad  20 reps;Right;Strengthening;1 set;2 sets   with ball  squeeze, 1 set no weight, 1 set with 4#   Other Seated Knee/Hip Exercises  hip er 2 x 20 with green theraband      Knee/Hip Exercises: Supine   Quad Sets  1 set;20 reps;Right;Strengthening   with ball squeeze for VMO activation     Manual Therapy   Manual Therapy  Other (comment);Soft tissue mobilization;Joint mobilization    Manual therapy comments  skilled palpation and monitoring of pt throughout TPDN    Joint Mobilization  in sitting with knee hanging off table distraction combined with PROM flexion    Soft tissue mobilization  IASTM along the patellar tendon, and peroneal longus/brevis    Other Manual Therapy  MTPR along the vastus lateralis x 2      Ankle Exercises: Seated   Other Seated Ankle Exercises  ankle posterior tib strengthenig 1 x 20 with blue theraband       Trigger Point Dry Needling - 10/06/18 0001    Consent Given?  Yes    Education Handout Provided  Yes    Muscles Treated Lower Quadrant  Peroneals    Peroneals Response  --   r peroneal longus/ brevis          PT Education - 10/06/18 1117    Education Details  muscle anatomy and referral patterns. What TPDN is/ benefits and after care.    Person(s) Educated  Patient    Methods  Explanation;Verbal cues    Comprehension  Verbalized understanding;Verbal cues required       PT Short Term Goals - 09/30/18 0850      PT SHORT TERM GOAL #1   Title  pt will be aware of poor alignment habits and able to correct    Baseline  does not remenber any instructions    Status  On-going      PT SHORT TERM GOAL #2   Title  Pt will be able to utilize stretches through the day to control knee pain    Baseline  Does as needed every hour    Status  Achieved        PT Long Term Goals - 09/30/18 0851      PT LONG TERM GOAL #1   Title  Pt will be able to stand for work activities pain <=2/10    Baseline  8-9 depending on activity.  Longest time on feet before incr pain is a 2-3 hours.     Status  On-going       PT LONG TERM GOAL #2   Title  hip abd strength to 5/5    Status  On-going      PT LONG TERM GOAL #3   Title  pt will be able to flex Rt knee actively equal to Lt    Baseline  88 degrees today    Status  On-going      PT LONG TERM GOAL #4   Title  Pt will demo proper posture through LE biomechanical chain when squatting and climbing stairs    Baseline  unable due to pain at eval            Plan - 10/06/18 1118    Clinical Impression Statement  pt reports increased soreness since the last session. educated and consent was given for TPDN along the peroneals followed with IASTM and tibiofemoral mobs/ PROM flexion. continued working on strengthening to promote stability. pt noted some tightness in the knee but reported improvement end of session.     PT Treatment/Interventions  ADLs/Self Care Home Management;Cryotherapy;Ultrasound;Moist Heat;Electrical Stimulation;Gait training;Stair training;Functional mobility training;Therapeutic activities;Patient/family education;Neuromuscular re-education;Therapeutic exercise;Manual techniques;Passive range of motion;Taping;Dry needling    PT Next Visit Plan  cont mcconnell tape, glut strengthening, how was DN, STW, patellar stabilitly. gait training,     PT Home Exercise Plan  ankle inversion/eversion tband, HSS, clam    Consulted and Agree with Plan of Care  Patient       Patient will benefit from skilled therapeutic intervention in order to improve the following deficits and impairments:  Abnormal gait, Improper body mechanics, Pain, Postural dysfunction, Increased muscle spasms, Decreased activity tolerance, Decreased range of motion, Decreased strength, Impaired flexibility, Difficulty walking  Visit Diagnosis: Acute pain of right knee  Muscle weakness (generalized)     Problem List Patient Active Problem List   Diagnosis Date Noted  . Acute pain of right knee 06/20/2018  . Major depressive disorder, recurrent episode (HCC) 07/06/2014   . Social anxiety disorder 07/06/2014  . Multiple sclerosis (  HCC) 08/24/2013  . Nonspecific (abnormal) findings on radiological and other examination of skull and head 08/24/2013  . Disturbance of skin sensation 08/24/2013  . Other malaise and fatigue 08/24/2013  . Spasm of muscle 08/24/2013   Lulu Riding PT, DPT, LAT, ATC  10/06/18  11:48 AM      [facility] [address] [place], [place], [address] Phone: [phone]   Fax:  [phone]  Name: JESSE MADREN MRN: [id number] Date of Birth: October 27, 1985

## 2018-10-10 ENCOUNTER — Ambulatory Visit (HOSPITAL_COMMUNITY): Payer: BLUE CROSS/BLUE SHIELD | Admitting: Psychiatry

## 2018-10-10 ENCOUNTER — Other Ambulatory Visit: Payer: Self-pay

## 2018-10-10 ENCOUNTER — Ambulatory Visit (INDEPENDENT_AMBULATORY_CARE_PROVIDER_SITE_OTHER): Payer: BC Managed Care – PPO | Admitting: Psychiatry

## 2018-10-10 ENCOUNTER — Encounter (HOSPITAL_COMMUNITY): Payer: Self-pay | Admitting: Psychiatry

## 2018-10-10 DIAGNOSIS — F411 Generalized anxiety disorder: Secondary | ICD-10-CM | POA: Diagnosis not present

## 2018-10-10 DIAGNOSIS — F330 Major depressive disorder, recurrent, mild: Secondary | ICD-10-CM | POA: Diagnosis not present

## 2018-10-10 NOTE — Patient Instructions (Addendum)
1. Hold antidepressant 2. Next appointment: 8/28 at 9:20 or sooner as needed

## 2018-10-10 NOTE — Progress Notes (Signed)
Virtual Visit via Video Note  I connected with William Fitzgerald on 10/10/18 at 11:00 AM EDT by a video enabled telemedicine application and verified that I am speaking with the correct person using two identifiers.   I discussed the limitations of evaluation and management by telemedicine and the availability of in person appointments. The patient expressed understanding and agreed to proceed.      I discussed the assessment and treatment plan with the patient. The patient was provided an opportunity to ask questions and all were answered. The patient agreed with the plan and demonstrated an understanding of the instructions.   The patient was advised to call back or seek an in-person evaluation if the symptoms worsen or if the condition fails to improve as anticipated.  I provided 15 minutes of non-face-to-face time during this encounter.   Neysa Hotter, MD    [facility] MD/PA/NP OP Progress Note  10/10/2018 11:21 AM William Fitzgerald  MRN:  [id number]  Chief Complaint:  Chief Complaint    Follow-up; Depression; Anxiety     HPI:  This is a follow-up appointment for depression.  He states that he injured his leg, and he has been seen physical therapy.  He is currently on medical leave.  He has been trying to exercise, and he felt good when he went out for nature walk. He agrees to do it more often if his leg allows. He had to end the relationship with the lady. He states that he was told that he was selfish. He states that he tends to be that way, reflecting his prior relationship.  He has occasional insomnia.  He feels depressed at times/isolative, thinking about family and relationship issues, although he believes it has been better compared to before. He has fair appetite.  He denies SI.  He has occasional anxiety, feeling tense.  He denies panic attacks.    Visit Diagnosis:    ICD-10-CM   1. Generalized anxiety disorder F41.1   2. Mild episode of recurrent major depressive disorder  (HCC) F33.0     Past Psychiatric History: Please see initial evaluation for full details. I have reviewed the history. No updates at this time.     Past Medical History:  Past Medical History:  Diagnosis Date  . Anxiety   . Depression   . DVT (deep venous thrombosis) (HCC) 2014  . MS (multiple sclerosis) (HCC)   . MS (multiple sclerosis) (HCC)     Past Surgical History:  Procedure Laterality Date  . DENTAL SURGERY  2016  . VASCULAR SURGERY Right 06/2013    Family Psychiatric History: Please see initial evaluation for full details. I have reviewed the history. No updates at this time.     Family History:  Family History  Problem Relation Age of Onset  . Seizures Other   . Lung cancer Maternal Aunt   . Diabetes Maternal Grandfather   . Lung cancer Maternal Aunt   . Schizophrenia Cousin   . ADD / ADHD Cousin     Social History:  Social History   Socioeconomic History  . Marital status: Single    Spouse name: Not on file  . Number of children: 0  . Years of education: 12th  . Highest education level: Not on file  Occupational History    Employer: [employer]     Comment: [employer]  Social Needs  . Financial resource strain: Not on file  . Food insecurity:    Worry: Not on file  Inability: Not on file  . Transportation needs:    Medical: Not on file    Non-medical: Not on file  Tobacco Use  . Smoking status: Former Smoker    Types: Cigarettes  . Smokeless tobacco: Never Used  . Tobacco comment: Smokes a cigarette every once in awhile   Substance and Sexual Activity  . Alcohol use: Yes    Alcohol/week: 0.0 standard drinks    Comment: Reports a beer or glass of wine every other week.   . Drug use: No    Comment: quit marijuana 2014  . Sexual activity: Not Currently    Birth control/protection: Condom  Lifestyle  . Physical activity:    Days per week: Not on file    Minutes per session: Not on file  . Stress: Not on file  Relationships  .  Social connections:    Talks on phone: Not on file    Gets together: Not on file    Attends religious service: Not on file    Active member of club or organization: Not on file    Attends meetings of clubs or organizations: Not on file    Relationship status: Not on file  Other Topics Concern  . Not on file  Social History Narrative   Patient lives at home with family.   Caffeine Use: tea occasionally    Allergies:  Allergies  Allergen Reactions  . Gadolinium Derivatives Nausea Only    Pt became nauseous after contrast    Metabolic Disorder Labs: No results found for: HGBA1C, MPG No results found for: PROLACTIN No results found for: CHOL, TRIG, HDL, CHOLHDL, VLDL, LDLCALC No results found for: TSH  Therapeutic Level Labs: No results found for: LITHIUM No results found for: VALPROATE No components found for:  CBMZ  Current Medications: Current Outpatient Medications  Medication Sig Dispense Refill  . Cholecalciferol (VITAMIN D) 2000 units CAPS Take 2,000 Units by mouth daily.    . clonazePAM (KLONOPIN) 1 MG tablet Take 1 tablet (1 mg total) by mouth at bedtime. (Patient not taking: Reported on 07/11/2018) 30 tablet 0  . cyclobenzaprine (FLEXERIL) 10 MG tablet Take 1 tablet (10 mg total) by mouth 3 (three) times daily as needed for muscle spasms. (Patient not taking: Reported on 07/11/2018) 20 tablet 0  . diazepam (VALIUM) 5 MG tablet Take 1 tablet (5 mg total) by mouth every 12 (twelve) hours as needed for anxiety. (Patient not taking: Reported on 07/11/2018) 14 tablet 0  . Diclofenac Sodium (PENNSAID) 2 % SOLN Place 1 application onto the skin 2 (two) times daily. (Patient not taking: Reported on 07/11/2018) 1 Bottle 3  . escitalopram (LEXAPRO) 20 MG tablet Take 1 tablet (20 mg total) by mouth daily. (Patient not taking: Reported on 07/11/2018) 90 tablet 0  . hydrocortisone 2.5 % lotion Apply topically 2 (two) times daily. (Patient not taking: Reported on 07/11/2018) 59 mL 0  .  ibuprofen (ADVIL,MOTRIN) 600 MG tablet Take 1 tablet (600 mg total) by mouth every 6 (six) hours as needed. (Patient not taking: Reported on 07/11/2018) 30 tablet 0  . Ocrelizumab (OCREVUS IV) Inject into the vein. Every 6 months     No current facility-administered medications for this visit.      Musculoskeletal: Strength & Muscle Tone: N/A Gait & Station: N/A Patient leans: N/A  Psychiatric Specialty Exam: Review of Systems  Psychiatric/Behavioral: Positive for depression. Negative for hallucinations, memory loss, substance abuse and suicidal ideas. The patient is nervous/anxious and has insomnia.  All other systems reviewed and are negative.   There were no vitals taken for this visit.There is no height or weight on file to calculate BMI.  General Appearance: Fairly Groomed  Eye Contact:  Good  Speech:  Clear and Coherent  Volume:  Normal  Mood:  "better"  Affect:  Appropriate, Congruent and euthymic  Thought Process:  Coherent and Goal Directed  Orientation:  Full (Time, Place, and Person)  Thought Content: Logical   Suicidal Thoughts:  No  Homicidal Thoughts:  No  Memory:  Immediate;   Good  Judgement:  Good  Insight:  Fair  Psychomotor Activity:  Normal  Concentration:  Concentration: Good and Attention Span: Good  Recall:  Good  Fund of Knowledge: Good  Language: Good  Akathisia:  No  Handed:  Right  AIMS (if indicated): not done  Assets:  Communication Skills Desire for Improvement  ADL's:  Intact  Cognition: WNL  Sleep:  Poor   Screenings: GAD-7     Office Visit from 04/11/2017 in [facility] Office Visit from 12/17/2016 in [facility] Counselor from 12/15/2015 in [facility] [place]  Total GAD-7 Score  20  16  13     PHQ2-9     Office Visit from 04/11/2017 in [facility] Office Visit from 12/17/2016 in [facility] Counselor from 12/15/2015 in  BEHAVIORAL HEALTH OUTPATIENT THERAPY [place] Office Visit from 05/24/2015 in [facility] Counselor from 07/06/2014 in BEHAVIORAL HEALTH OUTPATIENT THERAPY [place]  PHQ-2 Total Score  4  3  1   0  5  PHQ-9 Total Score  17  13  5   -  14       Assessment and Plan:  William Fitzgerald is a 33 y.o. year old male with a history of depression, multiple sclerosis (diagnosed in 2015) , who presents for follow up appointment for Generalized anxiety disorder  Mild episode of recurrent major depressive disorder (HCC)  # MDD, mild, recurrent without psychotic features He reports slight improvement in depressive symptoms and anxiety since her last visit.  Psychosocial stressors includes MS, loss of his father in February, and ending a relationship with a lady. He would like to continue to hold antidepressant at this time.  he agrees to work on mindfulness exercise and continue to see his therapist.  Discussed behavioral activation.   #r.o ADHD # r/o neurocognitive deficit He is planning to schedule appointment for neuropsych evaluation.  Noted that he has no known history of ADHD, while he has been struggling with inattention since high school by self-report.  Etiology is likely multifactorial given MS, mood symptoms and marijuana use.   Plan I have reviewed and updated plans as below 1. Hold antidepressant 2. Next appointment: 8/28 at 9:20 for 20 mins, video  1. (Hold lexapro; used to be on 20 mg)  Past trials of medication:lexapro,Wellbutrin(fatigue), clonazepam,  The patient demonstrates the following risk factors for suicide: Chronic risk factors for suicide include:psychiatric disorder ofdepression. Acute risk factorsfor suicide include: N/A. Protective factorsfor this patient include: positive social support, coping skills and hope for the future. Considering these factors, the overall suicide risk at this point appears to below. Patientisappropriate for outpatient  follow up    Neysa Hotter, MD 10/10/2018, 11:21 AM

## 2018-10-13 ENCOUNTER — Encounter: Payer: Self-pay | Admitting: Physical Therapy

## 2018-10-13 ENCOUNTER — Other Ambulatory Visit: Payer: Self-pay

## 2018-10-13 ENCOUNTER — Ambulatory Visit: Payer: BC Managed Care – PPO | Admitting: Physical Therapy

## 2018-10-13 DIAGNOSIS — M25561 Pain in right knee: Secondary | ICD-10-CM

## 2018-10-13 DIAGNOSIS — M545 Low back pain, unspecified: Secondary | ICD-10-CM

## 2018-10-13 DIAGNOSIS — M6281 Muscle weakness (generalized): Secondary | ICD-10-CM

## 2018-10-13 NOTE — Therapy (Signed)
[place], [place], [address] Phone: [phone]   Fax:  [phone]  Physical Therapy Treatment / discharge Summary  Patient Details  Name: William Fitzgerald MRN: [id number] Date of Birth: 10-24-1985 Referring Provider (PT): Rosemarie Ax, MD   Encounter Date: 10/13/2018  PT End of Session - 10/13/18 1045    Visit Number  6    Number of Visits  10    Date for PT Re-Evaluation  10/24/18    Authorization Type  BC[employer]    PT Start Time  7867   pt arrived 14 min late   PT Stop Time  1108   shortened session due to discharge   PT Time Calculation (min)  24 min    Activity Tolerance  Patient tolerated treatment well    Behavior During Therapy  [facility] for tasks assessed/performed       Past Medical History:  Diagnosis Date  . Anxiety   . Depression   . DVT (deep venous thrombosis) ([place]) 2014  . MS (multiple sclerosis) ([place])   . MS (multiple sclerosis) ([place])     Past Surgical History:  Procedure Laterality Date  . DENTAL SURGERY  2016  . VASCULAR SURGERY Right 06/2013    There were no vitals filed for this visit.  Subjective Assessment - 10/13/18 1046    Subjective  " I have taken a leave of absence from my work, I think I am still having soreness, when I was walking I caught my foot and it caused my knee pain to get worse"     Currently in Pain?  Yes    Pain Score  4    standing / walking at worst 10/10   Pain Location  Knee    Pain Orientation  Right    Pain Descriptors / Indicators  Aching;Sore    Pain Type  Chronic pain    Pain Onset  More than a month ago    Pain Frequency  Intermittent    Aggravating Factors   bending, stairs    Pain Relieving Factors  resting,          [facility] PT Assessment - 10/13/18 0001      AROM   Right Knee Extension  0    Right Knee Flexion  85      Strength   Right Hip ABduction  4+/5   in available ROM   Right Hip ADduction  4+/5   in available ROM                           PT Education - 10/13/18 1104    Education Details  reviewed previously provided HEP and updated for continued strengthening. Discussed gradual progression of strengthening to promote functional improvement.    Person(s) Educated  Patient    Methods  Explanation;Verbal cues;Handout    Comprehension  Verbalized understanding;Verbal cues required       PT Short Term Goals - 10/13/18 1053      PT SHORT TERM GOAL #1   Title  pt will be aware of poor alignment habits and able to correct    Period  Weeks    Status  Achieved      PT SHORT TERM GOAL #2   Title  Pt will be able to utilize stretches through the day to control knee pain    Time  2    Period  Weeks  Status  Achieved        PT Long Term Goals - 10/13/18 1054      PT LONG TERM GOAL #1   Title  Pt will be able to stand for work activities pain <=2/10    Period  Weeks    Status  Not Met      PT LONG TERM GOAL #2   Title  hip abd strength to 5/5    Time  6    Period  Weeks    Status  Partially Met      PT LONG TERM GOAL #3   Title  pt will be able to flex Rt knee actively equal to Lt    Baseline  85 degrees today    Period  Weeks    Status  Not Met      PT LONG TERM GOAL #4   Title  Pt will demo proper posture through LE biomechanical chain when squatting and climbing stairs    Baseline  stairs continues to have circumducted gait    Time  6    Period  Weeks    Status  Not Met            Plan - 10/13/18 1105    Clinical Impression Statement  pt continues to report fluctuating pain with limited improvement between sessions, despite being consistent with his HEP. He has recently taken leave of absence from work to work on getting the knee better. pt has made limited progress with his goals and functional ROM. Pt reports he would like to go back to his MD for further assessment. based on continued pain/ limited functional improvement plan to refer pt back to his MD  and discharge from PT today.     PT Treatment/Interventions  ADLs/Self Care Home Management;Cryotherapy;Ultrasound;Moist Heat;Electrical Stimulation;Gait training;Stair training;Functional mobility training;Therapeutic activities;Patient/family education;Neuromuscular re-education;Therapeutic exercise;Manual techniques;Passive range of motion;Taping;Dry needling    PT Next Visit Plan  D/C    PT Home Exercise Plan  ankle inversion/eversion tband, HSS, clam    Consulted and Agree with Plan of Care  Patient       Patient will benefit from skilled therapeutic intervention in order to improve the following deficits and impairments:  Abnormal gait, Improper body mechanics, Pain, Postural dysfunction, Increased muscle spasms, Decreased activity tolerance, Decreased range of motion, Decreased strength, Impaired flexibility, Difficulty walking  Visit Diagnosis: Acute pain of right knee  Muscle weakness (generalized)  Acute bilateral low back pain without sciatica     Problem List Patient Active Problem List   Diagnosis Date Noted  . Acute pain of right knee 06/20/2018  . Major depressive disorder, recurrent episode ([place]) 07/06/2014  . Social anxiety disorder 07/06/2014  . Multiple sclerosis ([place]) 08/24/2013  . Nonspecific (abnormal) findings on radiological and other examination of skull and head 08/24/2013  . Disturbance of skin sensation 08/24/2013  . Other malaise and fatigue 08/24/2013  . Spasm of muscle 08/24/2013    [place] 10/13/2018, 11:08 AM  [facility] [address] [place], [place], [address] Phone: [phone]   Fax:  [phone]  Name: William Fitzgerald MRN: [id number] Date of Birth: 01-04-1986       PHYSICAL THERAPY DISCHARGE SUMMARY  Visits from Start of Care: 6  Current functional level related to goals / functional outcomes: See goals   Remaining deficits: Continued limited knee flexion with  pain/ muscle guarding. Limited standing/ walking endurance. Abnormal gait pattern   Education / Equipment: HEP,  theraband, posture, gait/ stair mechanics  Plan: Patient agrees to discharge.  Patient goals were partially met. Patient is being discharged due to the patient's request.  ?????           Kristoffer Leamon PT, DPT, LAT, ATC  10/13/18  11:09 AM

## 2018-10-20 ENCOUNTER — Ambulatory Visit (INDEPENDENT_AMBULATORY_CARE_PROVIDER_SITE_OTHER): Payer: BC Managed Care – PPO

## 2018-10-20 ENCOUNTER — Ambulatory Visit (INDEPENDENT_AMBULATORY_CARE_PROVIDER_SITE_OTHER): Payer: BC Managed Care – PPO | Admitting: Family Medicine

## 2018-10-20 ENCOUNTER — Encounter: Payer: Self-pay | Admitting: Family Medicine

## 2018-10-20 ENCOUNTER — Ambulatory Visit: Payer: BC Managed Care – PPO | Admitting: Physical Therapy

## 2018-10-20 VITALS — BP 122/78 | HR 67 | Temp 97.9°F | Ht 76.0 in

## 2018-10-20 DIAGNOSIS — M1711 Unilateral primary osteoarthritis, right knee: Secondary | ICD-10-CM

## 2018-10-20 NOTE — Patient Instructions (Addendum)
Good to see you Take tylenol 650 mg three times a day is the best evidence based medicine we have for arthritis.  Glucosamine sulfate 750mg  twice a day is a supplement that has been shown to help moderate to severe arthritis. Vitamin D 2000 IU daily Fish oil 2 grams daily.  Tumeric 500mg  twice daily.  Capsaicin topically up to four times a day may also help with pain.  If the steroid injection doesn't help then we can try different injections.  The [facility] rep will call you about setting up a fitting for the knee brace.    Please send me a message in MyChart with any questions or updates.  Please see me back in 4 weeks.   --Dr. Raeford Razor

## 2018-10-20 NOTE — Progress Notes (Signed)
William Fitzgerald - 33 y.o. male MRN [id number]  Date of birth: 1985-05-28  SUBJECTIVE:  Including CC & ROS.  Chief Complaint  Patient presents with  . Follow-up    right torn meniscus/ paperwork    William Fitzgerald is a 33 y.o. male that is presenting with acute on chronic right knee pain.  The pain is worse after he stands for a period of time.  The pain is globally around the knee.  It is worse in the medial compartment.  Has tried physical therapy with limited improvement.  Has had to reduce his work schedule to help alleviate the pain.  The pain is sharp and stabbing.  He feels the pain around the knee but does have some pain on the lateral aspect of the lower leg.  No giving way or mechanical symptoms.  Independent review of the right knee x-ray from 2/14 shows an osteochondroma and mild degenerative changes.  Independent review of the right knee MRI from 3/13 shows small lateral meniscal tear on the anterior horn.  Shows degenerative changes and small joint effusion.   Review of Systems  Constitutional: Negative for fever.  HENT: Negative for congestion.   Respiratory: Negative for cough.   Cardiovascular: Negative for chest pain.  Gastrointestinal: Negative for abdominal distention.  Musculoskeletal: Positive for gait problem.  Skin: Negative for color change.  Neurological: Negative for weakness.  Hematological: Negative for adenopathy.    HISTORY: Past Medical, Surgical, Social, and Family History Reviewed & Updated per EMR.   Pertinent Historical Findings include:  Past Medical History:  Diagnosis Date  . Anxiety   . Depression   . DVT (deep venous thrombosis) ([place]) 2014  . MS (multiple sclerosis) ([place])   . MS (multiple sclerosis) ([place])     Past Surgical History:  Procedure Laterality Date  . DENTAL SURGERY  2016  . VASCULAR SURGERY Right 06/2013    Allergies  Allergen Reactions  . Gadolinium Derivatives Nausea Only    Pt became nauseous after contrast     Family History  Problem Relation Age of Onset  . Seizures Other   . Lung cancer Maternal Aunt   . Diabetes Maternal Grandfather   . Lung cancer Maternal Aunt   . Schizophrenia Cousin   . ADD / ADHD Cousin      Social History   Socioeconomic History  . Marital status: Single    Spouse name: Not on file  . Number of children: 0  . Years of education: 12th  . Highest education level: Not on file  Occupational History    Employer: [employer]     Comment: [employer]  Social Needs  . Financial resource strain: Not on file  . Food insecurity    Worry: Not on file    Inability: Not on file  . Transportation needs    Medical: Not on file    Non-medical: Not on file  Tobacco Use  . Smoking status: Former Smoker    Types: Cigarettes  . Smokeless tobacco: Never Used  . Tobacco comment: Smokes a cigarette every once in awhile   Substance and Sexual Activity  . Alcohol use: Yes    Alcohol/week: 0.0 standard drinks    Comment: Reports a beer or glass of wine every other week.   . Drug use: No    Comment: quit marijuana 2014  . Sexual activity: Not Currently    Birth control/protection: Condom  Lifestyle  . Physical activity    Days  per week: Not on file    Minutes per session: Not on file  . Stress: Not on file  Relationships  . Social Musician on phone: Not on file    Gets together: Not on file    Attends religious service: Not on file    Active member of club or organization: Not on file    Attends meetings of clubs or organizations: Not on file    Relationship status: Not on file  . Intimate partner violence    Fear of current or ex partner: Not on file    Emotionally abused: Not on file    Physically abused: Not on file    Forced sexual activity: Not on file  Other Topics Concern  . Not on file  Social History Narrative   Patient lives at home with family.   Caffeine Use: tea occasionally     PHYSICAL EXAM:  VS: BP 122/78   Pulse 67    Temp 97.9 F (36.6 C) (Oral)   Ht 6\' 4"  (1.93 m)   SpO2 98%   BMI 42.85 kg/m  Physical Exam Gen: NAD, alert, cooperative with exam, well-appearing ENT: normal lips, normal nasal mucosa,  Eye: normal EOM, normal conjunctiva and lids CV:  no edema, +2 pedal pulses   Resp: no accessory muscle use, non-labored,   Skin: no rashes, no areas of induration  Neuro: normal tone, normal sensation to touch Psych:  normal insight, alert and oriented MSK:  Right knee: No obvious effusion. Tenderness to palpation over the medial joint line. Some instability with valgus and varus stress testing. Normal range of motion. Normal strength resistance. Negative Murray's test. Neurovascular intact   Aspiration/Injection Procedure Note William Fitzgerald 05/11/1985  Procedure: Injection Indications: Right knee pain  Procedure Details Consent: Risks of procedure as well as the alternatives and risks of each were explained to the (patient/caregiver).  Consent for procedure obtained. Time Out: Verified patient identification, verified procedure, site/side was marked, verified correct patient position, special equipment/implants available, medications/allergies/relevent history reviewed, required imaging and test results available.  Performed.  The area was cleaned with iodine and alcohol swabs.    The right knee superior lateral suprapatellar pouch was injected using 1 cc's of 40 mg Kenalog and 4 cc's of 0.25% bupivacaine with a 22 1 1/2" needle.  Ultrasound was used. Images were obtained in long views showing the injection.     A sterile dressing was applied.  Patient did tolerate procedure well.       ASSESSMENT & PLAN:   OA (osteoarthritis) of knee Acute on chronic knee pain.  He has different pathology within the knee.  It shows advanced degenerative changes in the MRI. -Injection today. -We will try a medial unloader due to thigh to calf ratio. -If no improvement consider gel  injections.

## 2018-10-21 NOTE — Assessment & Plan Note (Signed)
Acute on chronic knee pain.  He has different pathology within the knee.  It shows advanced degenerative changes in the MRI. -Injection today. -We will try a medial unloader due to thigh to calf ratio. -If no improvement consider gel injections.

## 2018-12-08 ENCOUNTER — Ambulatory Visit: Payer: BLUE CROSS/BLUE SHIELD | Admitting: Diagnostic Neuroimaging

## 2018-12-09 ENCOUNTER — Ambulatory Visit (INDEPENDENT_AMBULATORY_CARE_PROVIDER_SITE_OTHER): Payer: BC Managed Care – PPO | Admitting: Diagnostic Neuroimaging

## 2018-12-09 ENCOUNTER — Other Ambulatory Visit: Payer: Self-pay

## 2018-12-09 ENCOUNTER — Encounter: Payer: Self-pay | Admitting: Diagnostic Neuroimaging

## 2018-12-09 VITALS — BP 142/86 | HR 69 | Temp 96.0°F | Ht 75.0 in | Wt 346.0 lb

## 2018-12-09 DIAGNOSIS — M25561 Pain in right knee: Secondary | ICD-10-CM | POA: Diagnosis not present

## 2018-12-09 DIAGNOSIS — G8929 Other chronic pain: Secondary | ICD-10-CM | POA: Diagnosis not present

## 2018-12-09 DIAGNOSIS — G35 Multiple sclerosis: Secondary | ICD-10-CM
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
## 2018-12-10 LAB — CBC WITH DIFFERENTIAL/PLATELET
Basophils Absolute: 0.1 10*3/uL (ref 0.0–0.2)
Basos: 1 %
EOS (ABSOLUTE): 0.1 10*3/uL (ref 0.0–0.4)
Eos: 1 %
Hematocrit: 44.9 % (ref 37.5–51.0)
Hemoglobin: 14.5 g/dL (ref 13.0–17.7)
Immature Grans (Abs): 0 10*3/uL (ref 0.0–0.1)
Immature Granulocytes: 0 %
Lymphocytes Absolute: 1.3 10*3/uL (ref 0.7–3.1)
Lymphs: 23 %
MCH: 26.2 pg — ABNORMAL LOW (ref 26.6–33.0)
MCHC: 32.3 g/dL (ref 31.5–35.7)
MCV: 81 fL (ref 79–97)
Monocytes Absolute: 0.5 10*3/uL (ref 0.1–0.9)
Monocytes: 9 %
Neutrophils Absolute: 3.8 10*3/uL (ref 1.4–7.0)
Neutrophils: 66 %
Platelets: 327 10*3/uL (ref 150–450)
RBC: 5.53 x10E6/uL (ref 4.14–5.80)
RDW: 13.7 % (ref 11.6–15.4)
WBC: 5.8 10*3/uL (ref 3.4–10.8)

## 2018-12-10 LAB — COMPREHENSIVE METABOLIC PANEL
ALT: 15 IU/L (ref 0–44)
AST: 18 IU/L (ref 0–40)
Albumin/Globulin Ratio: 1.8 (ref 1.2–2.2)
Albumin: 4.2 g/dL (ref 4.0–5.0)
Alkaline Phosphatase: 46 IU/L (ref 39–117)
BUN/Creatinine Ratio: 11 (ref 9–20)
BUN: 9 mg/dL (ref 6–20)
Bilirubin Total: 1.4 mg/dL — ABNORMAL HIGH (ref 0.0–1.2)
CO2: 24 mmol/L (ref 20–29)
Calcium: 9.6 mg/dL (ref 8.7–10.2)
Chloride: 106 mmol/L (ref 96–106)
Creatinine, Ser: 0.8 mg/dL (ref 0.76–1.27)
GFR calc Af Amer: 138 mL/min/{1.73_m2} (ref >59)
GFR calc non Af Amer: 119 mL/min/{1.73_m2} (ref >59)
Globulin, Total: 2.3 g/dL (ref 1.5–4.5)
Glucose: 91 mg/dL (ref 65–99)
Potassium: 4.8 mmol/L (ref 3.5–5.2)
Sodium: 141 mmol/L (ref 134–144)
Total Protein: 6.5 g/dL (ref 6.0–8.5)

## 2018-12-17 ENCOUNTER — Telehealth: Payer: Self-pay | Admitting: *Deleted

## 2018-12-17 NOTE — Telephone Encounter (Signed)
LVM informing patient he had unremarkable labs. Advised Dr Leta Baptist will continue with his current plan. Left number for any questions.

## 2018-12-29 NOTE — Progress Notes (Deleted)
[facility] MD/PA/NP OP Progress Note  12/29/2018 12:38 PM William Fitzgerald  MRN:  [id number]  Chief Complaint:  HPI: *** Visit Diagnosis: No diagnosis found.  Past Psychiatric History: Please see initial evaluation for full details. I have reviewed the history. No updates at this time.     Past Medical History:  Past Medical History:  Diagnosis Date  . Anxiety   . Depression   . DVT (deep venous thrombosis) ([facility]) 2014  . MS (multiple sclerosis) ([facility])   . MS (multiple sclerosis) ([facility])     Past Surgical History:  Procedure Laterality Date  . DENTAL SURGERY  2016  . VASCULAR SURGERY Right 06/2013    Family Psychiatric History: Please see initial evaluation for full details. I have reviewed the history. No updates at this time.     Family History:  Family History  Problem Relation Age of Onset  . Seizures Other   . Lung cancer Maternal Aunt   . Diabetes Maternal Grandfather   . Lung cancer Maternal Aunt   . Schizophrenia Cousin   . ADD / ADHD Cousin     Social History:  Social History   Socioeconomic History  . Marital status: Single    Spouse name: Not on file  . Number of children: 0  . Years of education: 12th  . Highest education level: Not on file  Occupational History    Employer: [employer]     Comment: [employer]  Social Needs  . Financial resource strain: Not on file  . Food insecurity    Worry: Not on file    Inability: Not on file  . Transportation needs    Medical: Not on file    Non-medical: Not on file  Tobacco Use  . Smoking status: Former Smoker    Types: Cigarettes  . Smokeless tobacco: Never Used  Substance and Sexual Activity  . Alcohol use: Yes    Alcohol/week: 0.0 standard drinks    Comment: Reports a beer or glass of wine every other week.   . Drug use: No    Comment: quit marijuana 2014  . Sexual activity: Not Currently    Birth control/protection: Condom  Lifestyle  . Physical activity    Days per week: Not on file     Minutes per session: Not on file  . Stress: Not on file  Relationships  . Social Musician on phone: Not on file    Gets together: Not on file    Attends religious service: Not on file    Active member of club or organization: Not on file    Attends meetings of clubs or organizations: Not on file    Relationship status: Not on file  Other Topics Concern  . Not on file  Social History Narrative   Patient lives at home with family.   Caffeine Use: tea occasionally    Allergies:  Allergies  Allergen Reactions  . Gadolinium Derivatives Nausea Only    Pt became nauseous after contrast    Metabolic Disorder Labs: No results found for: HGBA1C, MPG No results found for: PROLACTIN No results found for: CHOL, TRIG, HDL, CHOLHDL, VLDL, LDLCALC No results found for: TSH  Therapeutic Level Labs: No results found for: LITHIUM No results found for: VALPROATE No components found for:  CBMZ  Current Medications: Current Outpatient Medications  Medication Sig Dispense Refill  . Cholecalciferol (VITAMIN D) 2000 units CAPS Take 2,000 Units by mouth daily.    Marland Kitchen  Ocrelizumab (OCREVUS IV) Inject into the vein. Every 6 months     No current facility-administered medications for this visit.      Musculoskeletal: Strength & Muscle Tone: N/A Gait & Station: N/A Patient leans: N/A  Psychiatric Specialty Exam: ROS  There were no vitals taken for this visit.There is no height or weight on file to calculate BMI.  General Appearance: {Appearance:22683}  Eye Contact:  {BHH EYE CONTACT:22684}  Speech:  Clear and Coherent  Volume:  Normal  Mood:  {BHH MOOD:22306}  Affect:  {Affect (PAA):22687}  Thought Process:  Coherent  Orientation:  Full (Time, Place, and Person)  Thought Content: Logical   Suicidal Thoughts:  {ST/HT (PAA):22692}  Homicidal Thoughts:  {ST/HT (PAA):22692}  Memory:  Immediate;   Good  Judgement:  {Judgement (PAA):22694}  Insight:  {Insight (PAA):22695}   Psychomotor Activity:  Normal  Concentration:  Concentration: Good and Attention Span: Good  Recall:  Good  Fund of Knowledge: Good  Language: Good  Akathisia:  No  Handed:  Right  AIMS (if indicated): not done  Assets:  Communication Skills Desire for Improvement  ADL's:  Intact  Cognition: WNL  Sleep:  {BHH GOOD/FAIR/POOR:22877}   Screenings: GAD-7     Office Visit from 04/11/2017 in [facility] Office Visit from 12/17/2016 in [facility] Counselor from 12/15/2015 in [facility]  Total GAD-7 Score  20  16  13     PHQ2-9     Office Visit from 04/11/2017 in [facility] Office Visit from 12/17/2016 in [facility] Counselor from 12/15/2015 in [facility] Office Visit from 05/24/2015 in [facility] from 07/06/2014 in [facility]  PHQ-2 Total Score  4  3  1   0  5  PHQ-9 Total Score  17  13  5   -  14       Assessment and Plan:  William Fitzgerald is a 33 y.o. year old male with a history of depression,multiple sclerosis (diagnosed in 2015)  , who presents for follow up appointment for No diagnosis found.   # MDD, mild, recurrent without psychotic features  He reports slight improvement in depressive symptoms and anxiety since her last visit.  Psychosocial stressors includes MS, loss of his father in February, and ending a relationship with a lady. He would like to continue to hold antidepressant at this time.  he agrees to work on mindfulness exercise and continue to see his therapist.  Discussed behavioral activation.   # r/o ADHD # r/o neurocognitive deficit He is planning to schedule appointment for neuropsych evaluation.  Noted that he has no known history of ADHD, while he has been struggling with inattention since high school by self-report.  Etiology is likely  multifactorial given MS, mood symptoms and marijuana use.   Plan  1. Hold antidepressant 2. Next appointment: 8/28 at 9:20 for 20 mins, video  1. (Hold lexapro; used to be on 20 mg)  Past trials of medication:lexapro,Wellbutrin(fatigue), clonazepam,  The patient demonstrates the following risk factors for suicide: Chronic risk factors for suicide include:psychiatric disorder ofdepression. Acute risk factorsfor suicide include: N/A. Protective factorsfor this patient include: positive social support, coping skills and hope for the future. Considering these factors, the overall suicide risk at this point appears to below. Patientisappropriate for outpatient follow up  Norman Clay, MD 12/29/2018, 12:38 PM

## 2019-01-01 ENCOUNTER — Telehealth: Payer: Self-pay | Admitting: *Deleted

## 2019-01-01 NOTE — Telephone Encounter (Signed)
Ocrevus prescription form, continuation of treatment, on Dr [employer] desk for signature.

## 2019-01-02 ENCOUNTER — Ambulatory Visit (HOSPITAL_COMMUNITY): Payer: BC Managed Care – PPO | Admitting: Psychiatry

## 2019-01-07 NOTE — Telephone Encounter (Addendum)
Ocrevus form was signed and given to [employer], infusion room on 01/01/19. I received fax from [employer] asking for the form. Liann RN faxed it to them again. Per [place], she mailed the patient consent form last week and advised he complete and either fax to # on form or mail back to [place], and we'll fax. Called patient and LVM advising him to fill out and mail to [place] or fax. I advised he has to complete it in order for [employer] to consider him for patient foundation financial assistance.

## 2019-01-20 NOTE — Telephone Encounter (Signed)
Received Ocrevus patient consent form from patient; given to [employer].

## 2019-02-02 NOTE — Telephone Encounter (Signed)
Received [employer] form from [employer]. Dr Leta Baptist completed Rx, signed, and form given to [employer] RN, intrafusion to complete intrafusion portion and process.

## 2019-02-17 ENCOUNTER — Telehealth: Payer: Self-pay | Admitting: *Deleted

## 2019-02-17 NOTE — Telephone Encounter (Signed)
Received fax from [facility] [id number] is approved to receive free Ocrevus drug. Copy given to [facility], intra fusion. PAP foundation # [phone]

## 2019-07-16 ENCOUNTER — Other Ambulatory Visit: Payer: Self-pay

## 2019-07-16 ENCOUNTER — Encounter (HOSPITAL_COMMUNITY): Payer: Self-pay

## 2019-07-16 ENCOUNTER — Emergency Department (HOSPITAL_COMMUNITY)
Admission: EM | Admit: 2019-07-16 | Discharge: 2019-07-16 | Disposition: A | Discharge status: Home / Self Care | Payer: BC Managed Care – PPO | Attending: Emergency Medicine | Admitting: Emergency Medicine

## 2019-07-16 DIAGNOSIS — Z5321 Procedure and treatment not carried out due to patient leaving prior to being seen by health care provider: Secondary | ICD-10-CM | POA: Insufficient documentation

## 2019-07-16 DIAGNOSIS — M546 Pain in thoracic spine: Secondary | ICD-10-CM | POA: Insufficient documentation
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
## 2019-08-10 ENCOUNTER — Ambulatory Visit: Payer: Self-pay | Attending: Internal Medicine

## 2019-08-10 DIAGNOSIS — Z20822 Contact with and (suspected) exposure to covid-19: Secondary | ICD-10-CM

## 2019-08-11 LAB — SARS-COV-2, NAA 2 DAY TAT

## 2019-08-11 LAB — NOVEL CORONAVIRUS, NAA: SARS-CoV-2, NAA: NOT DETECTED

## 2019-08-17 ENCOUNTER — Telehealth: Payer: Self-pay | Admitting: *Deleted

## 2019-08-17 NOTE — Telephone Encounter (Addendum)
Called patient to schedule 6 month FU which was due Feb. He stated he left his previous job last fall, so he has no insurance. His previous infusion was Oct 2020, next Ocrevus is 09/14/19. I scheduled him for FU next week for regular FU and to discuss options. He verbalized understanding, appreciation. Called [employer] patient foundation, spoke with Anastasi to advise patient no longer has insurance. He stated that they already have on record he is approved as uninsured already. He still qualifies for free drug, and they will confirm again with patient in 11 months.  His free drug is approved from Oct 09/2018, expires 3 years later. Notified Liann, RN, infusion suite.

## 2019-09-02 ENCOUNTER — Telehealth: Payer: Self-pay | Admitting: Diagnostic Neuroimaging

## 2019-09-02 ENCOUNTER — Other Ambulatory Visit: Payer: Self-pay

## 2019-09-02 ENCOUNTER — Encounter: Payer: Self-pay | Admitting: Diagnostic Neuroimaging

## 2019-09-02 ENCOUNTER — Ambulatory Visit: Payer: Self-pay | Admitting: Diagnostic Neuroimaging

## 2019-09-02 VITALS — BP 118/78 | HR 85 | Temp 97.3°F | Ht 75.0 in | Wt 368.0 lb

## 2019-09-02 DIAGNOSIS — G35 Multiple sclerosis: Secondary | ICD-10-CM

## 2019-09-02 NOTE — Addendum Note (Signed)
Addended by: Tamera Stands D on: 09/02/2019 01:33 PM   Modules accepted: Orders
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
## 2019-09-02 NOTE — Telephone Encounter (Signed)
Self pay order sent to GI. They will reach out to the patient to schedule.  °

## 2019-09-03 LAB — COMPREHENSIVE METABOLIC PANEL
ALT: 31 IU/L (ref 0–44)
AST: 22 IU/L (ref 0–40)
Albumin/Globulin Ratio: 2 (ref 1.2–2.2)
Albumin: 4.4 g/dL (ref 4.0–5.0)
Alkaline Phosphatase: 65 IU/L (ref 39–117)
BUN/Creatinine Ratio: 10 (ref 9–20)
BUN: 8 mg/dL (ref 6–20)
Bilirubin Total: 1.8 mg/dL — ABNORMAL HIGH (ref 0.0–1.2)
CO2: 24 mmol/L (ref 20–29)
Calcium: 10 mg/dL (ref 8.7–10.2)
Chloride: 105 mmol/L (ref 96–106)
Creatinine, Ser: 0.84 mg/dL (ref 0.76–1.27)
GFR calc Af Amer: 135 mL/min/{1.73_m2} (ref >59)
GFR calc non Af Amer: 117 mL/min/{1.73_m2} (ref >59)
Globulin, Total: 2.2 g/dL (ref 1.5–4.5)
Glucose: 77 mg/dL (ref 65–99)
Potassium: 4.7 mmol/L (ref 3.5–5.2)
Sodium: 141 mmol/L (ref 134–144)
Total Protein: 6.6 g/dL (ref 6.0–8.5)

## 2019-09-03 LAB — CBC WITH DIFFERENTIAL/PLATELET
Basophils Absolute: 0.1 10*3/uL (ref 0.0–0.2)
Basos: 1 %
EOS (ABSOLUTE): 0.2 10*3/uL (ref 0.0–0.4)
Eos: 3 %
Hematocrit: 47.1 % (ref 37.5–51.0)
Hemoglobin: 15.3 g/dL (ref 13.0–17.7)
Immature Grans (Abs): 0 10*3/uL (ref 0.0–0.1)
Immature Granulocytes: 0 %
Lymphocytes Absolute: 1.5 10*3/uL (ref 0.7–3.1)
Lymphs: 23 %
MCH: 26.6 pg (ref 26.6–33.0)
MCHC: 32.5 g/dL (ref 31.5–35.7)
MCV: 82 fL (ref 79–97)
Monocytes Absolute: 0.7 10*3/uL (ref 0.1–0.9)
Monocytes: 10 %
Neutrophils Absolute: 4.3 10*3/uL (ref 1.4–7.0)
Neutrophils: 63 %
Platelets: 329 10*3/uL (ref 150–450)
RBC: 5.76 x10E6/uL (ref 4.14–5.80)
RDW: 13.7 % (ref 11.6–15.4)
WBC: 6.8 10*3/uL (ref 3.4–10.8)

## 2019-10-13 ENCOUNTER — Encounter: Payer: Self-pay | Admitting: *Deleted

## 2020-02-29 ENCOUNTER — Encounter: Payer: Self-pay | Admitting: *Deleted

## 2020-02-29 ENCOUNTER — Telehealth: Payer: Self-pay | Admitting: *Deleted

## 2020-02-29 NOTE — Telephone Encounter (Signed)
Per MD patient to begin receiving Ocrevus infusions off site. [facility] refer [facility] to [facility], [phone]. Called [facility], spoke with Jae Dire who will fax over new forms that need to be completed. She stated that [facility] will need to contact [facility] 1-2 week prior to infusion date to have Ocrevus shipped, ph [phone].

## 2020-03-01 NOTE — Telephone Encounter (Signed)
Received forms from [place] for Ocrevus PAP, new Rx, Start form. Called patient and advisedh im paperwork needs to be completed and signed by him to apply for free drug again. Advised him that his infusions will be off site, [facility]. He will come this afternoon, verbalized understanding, appreciation.

## 2020-03-02 ENCOUNTER — Ambulatory Visit: Payer: Self-pay | Admitting: Diagnostic Neuroimaging

## 2020-03-02 NOTE — Telephone Encounter (Signed)
Patient signed [employer] PAP form yesterday, all [employer] PAP forms and Rx completed, signed by MD. Arneta Cliche orders and PAP letter re: 'patient has free drug' to [facility], received confirmation.  Faxed new application for PAP, Rx to [facility], physician start form for Ocrevus to [place], received confirmation.

## 2020-03-02 NOTE — Telephone Encounter (Signed)
Received message from Ladona Ridgel, RN infusion who stated [facility] form Patient [facility] called, stated that this RN needs to call and schedule 1st Ocrevus appointment. I called patient to discuss; due to his work schedule he agreed to call [employer] and schedule . I gave him her name, number, and he verbalized understanding, appreciation.

## 2020-03-09 ENCOUNTER — Encounter: Payer: Self-pay | Admitting: *Deleted

## 2020-03-09 NOTE — Telephone Encounter (Signed)
Patient has scheduled his next Ocrevus infusion at [facility] for 03/25/20, 8 am.

## 2020-03-14 ENCOUNTER — Encounter: Payer: Self-pay | Admitting: Diagnostic Neuroimaging

## 2020-03-14 ENCOUNTER — Ambulatory Visit: Payer: No Typology Code available for payment source | Admitting: Diagnostic Neuroimaging

## 2020-03-14 ENCOUNTER — Other Ambulatory Visit: Payer: Self-pay

## 2020-03-14 VITALS — BP 148/87 | HR 84 | Ht 78.0 in | Wt 375.4 lb

## 2020-03-14 DIAGNOSIS — G35 Multiple sclerosis: Secondary | ICD-10-CM
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
## 2020-03-15 ENCOUNTER — Encounter: Payer: Self-pay | Admitting: *Deleted

## 2020-03-15 LAB — CBC WITH DIFFERENTIAL/PLATELET
Basophils Absolute: 0.1 10*3/uL (ref 0.0–0.2)
Basos: 1 %
EOS (ABSOLUTE): 0.1 10*3/uL (ref 0.0–0.4)
Eos: 1 %
Hematocrit: 46.6 % (ref 37.5–51.0)
Hemoglobin: 14.9 g/dL (ref 13.0–17.7)
Immature Grans (Abs): 0 10*3/uL (ref 0.0–0.1)
Immature Granulocytes: 1 %
Lymphocytes Absolute: 1.7 10*3/uL (ref 0.7–3.1)
Lymphs: 20 %
MCH: 26.5 pg — ABNORMAL LOW (ref 26.6–33.0)
MCHC: 32 g/dL (ref 31.5–35.7)
MCV: 83 fL (ref 79–97)
Monocytes Absolute: 0.7 10*3/uL (ref 0.1–0.9)
Monocytes: 8 %
Neutrophils Absolute: 5.9 10*3/uL (ref 1.4–7.0)
Neutrophils: 69 %
Platelets: 373 10*3/uL (ref 150–450)
RBC: 5.63 x10E6/uL (ref 4.14–5.80)
RDW: 13.3 % (ref 11.6–15.4)
WBC: 8.6 10*3/uL (ref 3.4–10.8)

## 2020-03-15 LAB — COMPREHENSIVE METABOLIC PANEL
ALT: 12 IU/L (ref 0–44)
AST: 15 IU/L (ref 0–40)
Albumin/Globulin Ratio: 1.8 (ref 1.2–2.2)
Albumin: 4.4 g/dL (ref 4.0–5.0)
Alkaline Phosphatase: 59 IU/L (ref 44–121)
BUN/Creatinine Ratio: 10 (ref 9–20)
BUN: 9 mg/dL (ref 6–20)
Bilirubin Total: 1.1 mg/dL (ref 0.0–1.2)
CO2: 26 mmol/L (ref 20–29)
Calcium: 10 mg/dL (ref 8.7–10.2)
Chloride: 103 mmol/L (ref 96–106)
Creatinine, Ser: 0.94 mg/dL (ref 0.76–1.27)
GFR calc Af Amer: 124 mL/min/{1.73_m2} (ref >59)
GFR calc non Af Amer: 107 mL/min/{1.73_m2} (ref >59)
Globulin, Total: 2.4 g/dL (ref 1.5–4.5)
Glucose: 71 mg/dL (ref 65–99)
Potassium: 4.7 mmol/L (ref 3.5–5.2)
Sodium: 141 mmol/L (ref 134–144)
Total Protein: 6.8 g/dL (ref 6.0–8.5)

## 2020-03-25 ENCOUNTER — Other Ambulatory Visit: Payer: Self-pay

## 2020-03-25 ENCOUNTER — Ambulatory Visit (HOSPITAL_COMMUNITY)
Admission: RE | Admit: 2020-03-25 | Discharge: 2020-03-25 | Disposition: A | Discharge status: Home / Self Care | Payer: Self-pay | Source: Ambulatory Visit | Attending: Internal Medicine | Admitting: Internal Medicine

## 2020-03-25 DIAGNOSIS — G35 Multiple sclerosis: Secondary | ICD-10-CM | POA: Insufficient documentation

## 2020-03-25 MED ORDER — SODIUM CHLORIDE 0.9 % IV SOLN
INTRAVENOUS | Status: DC | PRN
  Administered 2020-03-25: 250 mL via INTRAVENOUS

## 2020-03-25 MED ORDER — ACETAMINOPHEN 325 MG PO TABS
650.0000 mg | ORAL_TABLET | Freq: Once | ORAL | Status: AC
  Administered 2020-03-25: 650 mg via ORAL
  Filled 2020-03-25: qty 2

## 2020-03-25 MED ORDER — METHYLPREDNISOLONE SODIUM SUCC 125 MG IJ SOLR
100.0000 mg | Freq: Once | INTRAMUSCULAR | Status: AC
  Administered 2020-03-25: 100 mg via INTRAVENOUS
  Filled 2020-03-25: qty 2

## 2020-03-25 MED ORDER — FAMOTIDINE IN NACL 20-0.9 MG/50ML-% IV SOLN
20.0000 mg | Freq: Once | INTRAVENOUS | Status: AC
  Administered 2020-03-25: 20 mg via INTRAVENOUS
  Filled 2020-03-25: qty 50

## 2020-03-25 MED ORDER — SODIUM CHLORIDE 0.9 % IV SOLN
600.0000 mg | Freq: Once | INTRAVENOUS | Status: AC
  Administered 2020-03-25: 600 mg via INTRAVENOUS
  Filled 2020-03-25: qty 20

## 2020-03-25 MED ORDER — DIPHENHYDRAMINE HCL 50 MG/ML IJ SOLN
50.0000 mg | Freq: Once | INTRAMUSCULAR | Status: AC
  Administered 2020-03-25: 50 mg via INTRAVENOUS
  Filled 2020-03-25: qty 1

## 2020-03-25 NOTE — Discharge Instructions (Signed)
Ocrelizumab injection °What is this medicine? °OCRELIZUMAB (ok re LIZ ue mab) treats multiple sclerosis. It helps to decrease the number of multiple sclerosis relapses. It is not a cure. °This medicine may be used for other purposes; ask your health care provider or pharmacist if you have questions. °COMMON BRAND NAME(S): OCREVUS °What should I tell my health care provider before I take this medicine? °They need to know if you have any of these conditions: °· cancer °· hepatitis B infection °· other infection (especially a virus infection such as chickenpox, cold sores, or herpes) °· an unusual or allergic reaction to ocrelizumab, other medicines, foods, dyes or preservatives °· pregnant or trying to get pregnant °· breast-feeding °How should I use this medicine? °This medicine is for infusion into a vein. It is given by a health care professional in a hospital or clinic setting. °A special MedGuide will be given to you before each treatment. Be sure to read this information carefully each time. °Talk to your pediatrician regarding the use of this medicine in children. Special care may be needed. °Overdosage: If you think you have taken too much of this medicine contact a poison control center or emergency room at once. °NOTE: This medicine is only for you. Do not share this medicine with others. °What if I miss a dose? °Keep appointments for follow-up doses as directed. It is important not to miss your dose. Call your doctor or health care professional if you are unable to keep an appointment. °What may interact with this medicine? °· alemtuzumab °· daclizumab °· dimethyl fumarate °· fingolimod °· glatiramer °· interferon beta °· live virus vaccines °· mitoxantrone °· natalizumab °· peginterferon beta °· rituximab °· steroid medicines like prednisone or cortisone °· teriflunomide °This list may not describe all possible interactions. Give your health care provider a list of all the medicines, herbs,  non-prescription drugs, or dietary supplements you use. Also tell them if you smoke, drink alcohol, or use illegal drugs. Some items may interact with your medicine. °What should I watch for while using this medicine? °Tell your doctor or healthcare professional if your symptoms do not start to get better or if they get worse. °This medicine can cause serious allergic reactions. To reduce your risk you may need to take medicine before treatment with this medicine. Take your medicine as directed. °Women should inform their doctor if they wish to become pregnant or think they might be pregnant. There is a potential for serious side effects to an unborn child. Talk to your health care professional or pharmacist for more information. Male patients should use effective birth control methods while receiving this medicine and for 6 months after the last dose. °Call your doctor or health care professional for advice if you get a fever, chills or sore throat, or other symptoms of a cold or flu. Do not treat yourself. This drug decreases your body's ability to fight infections. Try to avoid being around people who are sick. °If you have a hepatitis B infection or a history of a hepatitis B infection, talk to your doctor. The symptoms of hepatitis B may get worse if you take this medicine. °In some patients, this medicine may cause a serious brain infection that may cause death. If you have any problems seeing, thinking, speaking, walking, or standing, tell your doctor right away. If you cannot reach your doctor, urgently seek other source of medical care. °This medicine can decrease the response to a vaccine. If you need to get   vaccinated, tell your healthcare professional if you have received this medicine. Extra booster doses may be needed. Talk to your doctor to see if a different vaccination schedule is needed. °Talk to your doctor about your risk of cancer. You may be more at risk for certain types of cancers if you  take this medicine. °What side effects may I notice from receiving this medicine? °Side effects that you should report to your doctor or health care professional as soon as possible: °· allergic reactions like skin rash, itching or hives, swelling of the face, lips, or tongue °· breathing problems °· facial flushing °· fast, irregular heartbeat °· lump or soreness in the breast °· signs and symptoms of herpes such as cold sore, shingles, or genital sores °· signs and symptoms of infection like fever or chills, cough, sore throat, pain or trouble passing urine °· signs and symptoms of low blood pressure like dizziness; feeling faint or lightheaded, falls; unusually weak or tired °· signs and symptoms of progressive multifocal leukoencephalopathy (PML) like changes in vision; clumsiness; confusion; personality changes; weakness on one side of the body °· swelling of the ankles, feet, hands °Side effects that usually do not require medical attention (report these to your doctor or health care professional if they continue or are bothersome): °· back pain °· depressed mood °· diarrhea °· pain, redness, or irritation at site where injected °This list may not describe all possible side effects. Call your doctor for medical advice about side effects. You may report side effects to FDA at 1-800-FDA-1088. °Where should I keep my medicine? °This drug is given in a hospital or clinic and will not be stored at home. °NOTE: This sheet is a summary. It may not cover all possible information. If you have questions about this medicine, talk to your doctor, pharmacist, or health care provider. °© 2020 [employer] (2018-04-28 07:41:53) ° °

## 2020-03-25 NOTE — Progress Notes (Signed)
Patient received IV Ocrevus as ordered by Joycelyn Schmid, MD. Titrated per protocol. Pre medications were given as ordered. Declined the 60 minutes post infusion observation.Tolerated well, vitals stable, discharge instructions given, verbalized understanding. Patient alert, oriented and ambulatory at the time of discharge.

## 2020-04-05 ENCOUNTER — Other Ambulatory Visit: Payer: Self-pay

## 2020-04-05 ENCOUNTER — Ambulatory Visit
Admission: RE | Admit: 2020-04-05 | Discharge: 2020-04-05 | Disposition: A | Discharge status: Home / Self Care | Payer: No Typology Code available for payment source | Source: Ambulatory Visit | Attending: Diagnostic Neuroimaging | Admitting: Diagnostic Neuroimaging

## 2020-04-05 DIAGNOSIS — G35 Multiple sclerosis: Secondary | ICD-10-CM

## 2020-04-05 MED ORDER — GADOBENATE DIMEGLUMINE 529 MG/ML IV SOLN
20.0000 mL | Freq: Once | INTRAVENOUS | Status: AC | PRN
  Administered 2020-04-05: 20 mL via INTRAVENOUS

## 2020-04-11 ENCOUNTER — Encounter: Payer: Self-pay | Admitting: *Deleted

## 2020-04-26 ENCOUNTER — Emergency Department (HOSPITAL_COMMUNITY)
Admission: EM | Admit: 2020-04-26 | Discharge: 2020-04-26 | Disposition: A | Discharge status: Home / Self Care | Payer: HRSA Program | Attending: Emergency Medicine | Admitting: Emergency Medicine

## 2020-04-26 ENCOUNTER — Other Ambulatory Visit: Payer: Self-pay

## 2020-04-26 ENCOUNTER — Emergency Department (HOSPITAL_COMMUNITY): Payer: HRSA Program

## 2020-04-26 ENCOUNTER — Encounter (HOSPITAL_COMMUNITY): Payer: Self-pay

## 2020-04-26 DIAGNOSIS — Z87891 Personal history of nicotine dependence: Secondary | ICD-10-CM | POA: Insufficient documentation

## 2020-04-26 DIAGNOSIS — E860 Dehydration: Secondary | ICD-10-CM

## 2020-04-26 DIAGNOSIS — R509 Fever, unspecified: Secondary | ICD-10-CM | POA: Diagnosis present

## 2020-04-26 DIAGNOSIS — U071 COVID-19: Secondary | ICD-10-CM | POA: Insufficient documentation

## 2020-04-26 LAB — COMPREHENSIVE METABOLIC PANEL
ALT: 24 U/L (ref 0–44)
AST: 29 U/L (ref 15–41)
Albumin: 3.6 g/dL (ref 3.5–5.0)
Alkaline Phosphatase: 46 U/L (ref 38–126)
Anion gap: 12 (ref 5–15)
BUN: 8 mg/dL (ref 6–20)
CO2: 27 mmol/L (ref 22–32)
Calcium: 9.3 mg/dL (ref 8.9–10.3)
Chloride: 96 mmol/L — ABNORMAL LOW (ref 98–111)
Creatinine, Ser: 1.05 mg/dL (ref 0.61–1.24)
GFR, Estimated: >60 mL/min (ref >60)
Glucose, Bld: 130 mg/dL — ABNORMAL HIGH (ref 70–99)
Potassium: 3.8 mmol/L (ref 3.5–5.1)
Sodium: 135 mmol/L (ref 135–145)
Total Bilirubin: 1.4 mg/dL — ABNORMAL HIGH (ref 0.3–1.2)
Total Protein: 7.7 g/dL (ref 6.5–8.1)

## 2020-04-26 LAB — CBC WITH DIFFERENTIAL/PLATELET
Abs Immature Granulocytes: 0.04 10*3/uL (ref 0.00–0.07)
Basophils Absolute: 0 10*3/uL (ref 0.0–0.1)
Basophils Relative: 0 %
Eosinophils Absolute: 0 10*3/uL (ref 0.0–0.5)
Eosinophils Relative: 0 %
HCT: 51.2 % (ref 39.0–52.0)
Hemoglobin: 17 g/dL (ref 13.0–17.0)
Immature Granulocytes: 1 %
Lymphocytes Relative: 33 %
Lymphs Abs: 1.7 10*3/uL (ref 0.7–4.0)
MCH: 26.4 pg (ref 26.0–34.0)
MCHC: 33.2 g/dL (ref 30.0–36.0)
MCV: 79.6 fL — ABNORMAL LOW (ref 80.0–100.0)
Monocytes Absolute: 0.6 10*3/uL (ref 0.1–1.0)
Monocytes Relative: 12 %
Neutro Abs: 2.7 10*3/uL (ref 1.7–7.7)
Neutrophils Relative %: 54 %
Platelets: 351 10*3/uL (ref 150–400)
RBC: 6.43 MIL/uL — ABNORMAL HIGH (ref 4.22–5.81)
RDW: 13.4 % (ref 11.5–15.5)
WBC: 5.1 10*3/uL (ref 4.0–10.5)
nRBC: 0 % (ref 0.0–0.2)

## 2020-04-26 LAB — LACTIC ACID, PLASMA: Lactic Acid, Venous: 2 mmol/L (ref 0.5–1.9)

## 2020-04-26 MED ORDER — SODIUM CHLORIDE 0.9 % IV BOLUS
1000.0000 mL | Freq: Once | INTRAVENOUS | Status: AC
  Administered 2020-04-26: 1000 mL via INTRAVENOUS

## 2020-04-26 NOTE — ED Provider Notes (Signed)
[facility] EMERGENCY DEPARTMENT Provider Note   CSN: [id number] Arrival date & time: 04/26/20  1312     History Chief Complaint  Patient presents with  . possible dehydration    William Fitzgerald is a 34 y.o. male.  HPI   34 year old otherwise healthy male who tested positive for Covid 9 days ago presents to the emergency department with concern for dehydration and fatigue.  Patient states his main symptoms have been fever, fatigue, loss of taste, decreased appetite and body aches.  He denies any chest pain, shortness of breath, abdominal pain, vomiting/diarrhea.  He noticed over the last day that his urine output has decreased and that it has been more concentrated.  He denies any dysuria, hematuria.  Past Medical History:  Diagnosis Date  . Anxiety   . Depression   . DVT (deep venous thrombosis) (HCC) 2014  . MS (multiple sclerosis) (HCC)   . MS (multiple sclerosis) (HCC)     Patient Active Problem List   Diagnosis Date Noted  . OA (osteoarthritis) of knee 06/20/2018  . Major depressive disorder, recurrent episode (HCC) 07/06/2014  . Social anxiety disorder 07/06/2014  . Multiple sclerosis (HCC) 08/24/2013  . Nonspecific (abnormal) findings on radiological and other examination of skull and head 08/24/2013  . Disturbance of skin sensation 08/24/2013  . Other malaise and fatigue 08/24/2013  . Spasm of muscle 08/24/2013    Past Surgical History:  Procedure Laterality Date  . DENTAL SURGERY  2016  . VASCULAR SURGERY Right 06/2013       Family History  Problem Relation Age of Onset  . Seizures Other   . Lung cancer Maternal Aunt   . Diabetes Maternal Grandfather   . Lung cancer Maternal Aunt   . Schizophrenia Cousin   . ADD / ADHD Cousin     Social History   Tobacco Use  . Smoking status: Former Smoker    Types: Cigarettes  . Smokeless tobacco: Never Used  Vaping Use  . Vaping Use: Never used  Substance Use Topics  . Alcohol use: Yes     Alcohol/week: 0.0 standard drinks    Comment: Reports a beer or glass of wine every other week.   . Drug use: No    Comment: quit marijuana 2014    Home Medications Prior to Admission medications   Medication Sig Start Date End Date Taking? Authorizing Provider  cholecalciferol (VITAMIN D3) 25 MCG (1000 UNIT) tablet Take 3,000 Units by mouth daily.   Yes [provider]  Ocrelizumab (OCREVUS IV) Inject into the vein every 6 (six) months.   Yes [provider]  OVER THE COUNTER MEDICATION Take 1 Dose by mouth once. OTC cough syrup   Yes [provider]    Allergies    Gadolinium derivatives  Review of Systems   Review of Systems  Constitutional: Positive for appetite change, fatigue and fever. Negative for chills.  HENT: Negative for congestion and sore throat.   Eyes: Negative for visual disturbance.  Respiratory: Negative for shortness of breath.   Cardiovascular: Negative for chest pain.  Gastrointestinal: Negative for abdominal pain, diarrhea and vomiting.  Genitourinary: Negative for dysuria and hematuria.       + Decreased urine output and concentrated urine  Musculoskeletal: Positive for myalgias. Negative for neck pain and neck stiffness.  Skin: Negative for rash.  Neurological: Negative for headaches.    Physical Exam Updated Vital Signs BP 98/65 (BP Location: Right Arm)   Pulse 95  Temp 98 F (36.7 C) (Oral)   Resp 16   Ht 6\' 6"  (1.981 m)   Wt (!) 149.7 kg   SpO2 98%   BMI 38.14 kg/m   Physical Exam Vitals and nursing note reviewed.  Constitutional:      General: He is not in acute distress.    Appearance: Normal appearance. He is not ill-appearing or toxic-appearing.  HENT:     Head: Normocephalic.     Mouth/Throat:     Mouth: Mucous membranes are dry.  Cardiovascular:     Rate and Rhythm: Normal rate.  Pulmonary:     Effort: Pulmonary effort is normal. No respiratory distress.  Abdominal:     General: There is no  distension.     Palpations: Abdomen is soft.     Tenderness: There is no abdominal tenderness. There is no right CVA tenderness, left CVA tenderness or guarding.  Musculoskeletal:        General: No swelling.  Skin:    General: Skin is warm.  Neurological:     Mental Status: He is alert and oriented to person, place, and time. Mental status is at baseline.  Psychiatric:        Mood and Affect: Mood normal.     ED Results / Procedures / Treatments   Labs (all labs ordered are listed, but only abnormal results are displayed) Labs Reviewed  LACTIC ACID, PLASMA - Abnormal; Notable for the following components:      Result Value   Lactic Acid, Venous 2.0 (*)    All other components within normal limits  COMPREHENSIVE METABOLIC PANEL - Abnormal; Notable for the following components:   Chloride 96 (*)    Glucose, Bld 130 (*)    Total Bilirubin 1.4 (*)    All other components within normal limits  CBC WITH DIFFERENTIAL/PLATELET - Abnormal; Notable for the following components:   RBC 6.43 (*)    MCV 79.6 (*)    All other components within normal limits    EKG None  Radiology DG Chest 1 View  Result Date: 04/26/2020 CLINICAL DATA:  COVID positive. EXAM: CHEST  1 VIEW COMPARISON:  One-view chest x-ray 11/24/2016 FINDINGS: Heart size is normal. Patchy airspace opacities are present at the lung bases bilaterally, left greater than right. Lung apices are clear. Visualized soft tissues and bony thorax are unremarkable. IMPRESSION: Patchy bibasilar airspace disease, left greater than right, consistent with multifocal pneumonia. Electronically Signed   By: 11/26/2016 M.D.   On: 04/26/2020 13:48    Procedures Procedures (including critical care time)  Medications Ordered in ED Medications  sodium chloride 0.9 % bolus 1,000 mL (has no administration in time range)    ED Course  I have reviewed the triage vital signs and the nursing notes.  Pertinent labs & imaging results  that were available during my care of the patient were reviewed by me and considered in my medical decision making (see chart for details).    MDM Rules/Calculators/A&P                          34 year old male presents for concern of dehydration, patient is Covid positive.  He states he has had a significant decrease in appetite, but has had minimal oral intake over the last 2 days.  He is concerned for decreased urine output.  He was tachycardic on arrival, normal heart rate on my evaluation, blood pressure is 98/65.  He is 36  in bed, fatigued but nontoxic-appearing, abdomen is benign.  He denies any other urinary symptoms.  Blood work looks slightly dehydrated, plan for IV hydration, reevaluation and outpatient follow-up. Final Clinical Impression(s) / ED Diagnoses Final diagnoses:  None    Rx / DC Orders ED Discharge Orders    None       Rozelle Logan, DO 04/26/20 2012

## 2020-04-26 NOTE — ED Triage Notes (Signed)
Patient had positive covid 2 weeks ago with minimal intake. Now complaining of ongoing fatigue with decreased urine output and dark urine. Patient alert and oriented.

## 2020-04-26 NOTE — ED Notes (Signed)
DC instructions reviewed with pt. PT verbalized understanding. Pt DC °

## 2020-04-26 NOTE — Discharge Instructions (Signed)
You have been seen and discharged from the emergency department.  Follow-up with your primary provider for reevaluation. You may take over-the-counter Tylenol and ibuprofen for fever/pain control. Push the fluids and stay well-hydrated. Take home medications as prescribed. If you have any worsening symptoms or further concerns or health please return to emergency department for further evaluation.

## 2020-08-23 ENCOUNTER — Telehealth: Payer: Self-pay | Admitting: *Deleted

## 2020-08-23 ENCOUNTER — Encounter: Payer: Self-pay | Admitting: *Deleted

## 2020-08-23 NOTE — Telephone Encounter (Signed)
Received fax from [place] re: unable to get in touch with patient to discuss ongoing eligibility for [employer] [facility]. Called patient, LVM requesting he call back.

## 2020-08-25 ENCOUNTER — Encounter: Payer: Self-pay | Admitting: *Deleted

## 2020-08-29 NOTE — Telephone Encounter (Signed)
Called patient, LVM #2 requesting he call [employer] re: Ocrevus PAP. Gave their phone #.  Requested he let me know when he has called them.

## 2020-09-07 NOTE — Telephone Encounter (Addendum)
Called Patient care center, spoke with Nelva Bush who stated patient does not have next Ocrevus infusion scheduled; last infusion was Nov 2021.  Called patient and left VM #3 advising that [place] needs him to call with updated insurance information to reconsider him for PAP. Advised I spoke with infusion center who said he doesn't have next infusion scheduled; it is due this month. Advised his FU is 11/09/20, and if he wants to discuss other DMT I can move FU sooner. Requested a call back or my chart message.

## 2020-09-12 NOTE — Telephone Encounter (Signed)
Patient came into office. He stated he has tried to reach [facility] once, and has tried calling me back. I gave him [employer] # and also gave him # for Patient care center where he gets Ocrevus infusion. I advised he call them because he may still be able to get infusion if PAP has not expired.  He stated he will make both calls. I advised he let me know if he has other questions. Patient verbalized understanding, appreciation.

## 2020-11-09 ENCOUNTER — Ambulatory Visit: Payer: Self-pay | Admitting: Diagnostic Neuroimaging

## 2020-12-12 ENCOUNTER — Other Ambulatory Visit: Payer: Self-pay

## 2020-12-12 DIAGNOSIS — G35 Multiple sclerosis: Secondary | ICD-10-CM

## 2020-12-12 NOTE — Addendum Note (Signed)
Addended by: Ann Maki on: 12/12/2020 04:54 PM   Modules accepted: Orders

## 2020-12-13 ENCOUNTER — Other Ambulatory Visit: Payer: Self-pay

## 2020-12-13 ENCOUNTER — Non-Acute Institutional Stay (HOSPITAL_COMMUNITY)
Admission: RE | Admit: 2020-12-13 | Discharge: 2020-12-13 | Disposition: A | Discharge status: Home / Self Care | Payer: Self-pay | Source: Ambulatory Visit | Attending: Internal Medicine | Admitting: Internal Medicine

## 2020-12-13 DIAGNOSIS — G35 Multiple sclerosis: Secondary | ICD-10-CM

## 2020-12-13 LAB — COMPREHENSIVE METABOLIC PANEL
ALT: 35 U/L (ref 0–44)
AST: 28 U/L (ref 15–41)
Albumin: 4.1 g/dL (ref 3.5–5.0)
Alkaline Phosphatase: 42 U/L (ref 38–126)
Anion gap: 8 (ref 5–15)
BUN: 9 mg/dL (ref 6–20)
CO2: 24 mmol/L (ref 22–32)
Calcium: 9.2 mg/dL (ref 8.9–10.3)
Chloride: 108 mmol/L (ref 98–111)
Creatinine, Ser: 0.71 mg/dL (ref 0.61–1.24)
GFR, Estimated: >60 mL/min (ref >60)
Glucose, Bld: 95 mg/dL (ref 70–99)
Potassium: 4 mmol/L (ref 3.5–5.1)
Sodium: 140 mmol/L (ref 135–145)
Total Bilirubin: 1.8 mg/dL — ABNORMAL HIGH (ref 0.3–1.2)
Total Protein: 7 g/dL (ref 6.5–8.1)

## 2020-12-13 MED ORDER — FAMOTIDINE IN NACL 20-0.9 MG/50ML-% IV SOLN
20.0000 mg | Freq: Once | INTRAVENOUS | Status: AC
  Administered 2020-12-13: 20 mg via INTRAVENOUS
  Filled 2020-12-13: qty 50

## 2020-12-13 MED ORDER — SODIUM CHLORIDE 0.9 % IV SOLN
INTRAVENOUS | Status: DC | PRN
  Administered 2020-12-13: 250 mL via INTRAVENOUS

## 2020-12-13 MED ORDER — SODIUM CHLORIDE 0.9 % IV SOLN
600.0000 mg | Freq: Once | INTRAVENOUS | Status: AC
  Administered 2020-12-13: 600 mg via INTRAVENOUS
  Filled 2020-12-13: qty 20

## 2020-12-13 MED ORDER — METHYLPREDNISOLONE SODIUM SUCC 125 MG IJ SOLR
125.0000 mg | Freq: Once | INTRAMUSCULAR | Status: AC
  Administered 2020-12-13: 125 mg via INTRAVENOUS
  Filled 2020-12-13: qty 2

## 2020-12-13 MED ORDER — DIPHENHYDRAMINE HCL 50 MG/ML IJ SOLN
50.0000 mg | Freq: Once | INTRAMUSCULAR | Status: AC
  Administered 2020-12-13: 50 mg via INTRAVENOUS
  Filled 2020-12-13: qty 1

## 2020-12-13 MED ORDER — ACETAMINOPHEN 325 MG PO TABS
650.0000 mg | ORAL_TABLET | Freq: Once | ORAL | Status: AC
  Administered 2020-12-13: 650 mg via ORAL
  Filled 2020-12-13: qty 2

## 2020-12-13 NOTE — Progress Notes (Signed)
Patient received IV Ocrevus  600mg  as ordered by MD. Pre infusion medications - Benadryl IV, Solu-medrol IV, Famotidine IV and Tylenol PO were given. CBC and CMP collected per orders. Medication was titrated per protocol. Patient declined to wait for the 60 minutes post infusion observation. Tolerated well, vitals stable, discharge instructions given, verbalized understanding. Patient alert, oriented and ambulatory at the time of discharge

## 2021-07-04 ENCOUNTER — Telehealth: Payer: Self-pay | Admitting: *Deleted

## 2021-07-04 ENCOUNTER — Encounter: Payer: Self-pay | Admitting: *Deleted

## 2021-07-04 NOTE — Telephone Encounter (Signed)
Patient replied with new phone #, demographics updated. Faxed form to [place] with his new #. Received confirmation.

## 2021-07-04 NOTE — Telephone Encounter (Signed)
Received fax from [employer] re: unable to reach patient to verify benefits prior to next infusion- phone # not in service.  I sent him my chart asking for new phone #, advised he call to schedule Ocrevus which is due this month and to schedule FU in our office, last seen 03/2020.

## 2021-08-21 ENCOUNTER — Telehealth: Payer: Self-pay

## 2021-08-21 NOTE — Telephone Encounter (Signed)
Received message from [place] stating they have had trouble getting in touch with the pt to verify  financial information. I have sent a mychart message to patient asking him to call [employer] at # [phone] to update.  ?

## 2021-08-26 ENCOUNTER — Encounter (HOSPITAL_COMMUNITY): Payer: Self-pay

## 2021-08-26 ENCOUNTER — Emergency Department (HOSPITAL_COMMUNITY): Payer: No Typology Code available for payment source

## 2021-08-26 ENCOUNTER — Other Ambulatory Visit: Payer: Self-pay

## 2021-08-26 ENCOUNTER — Emergency Department (HOSPITAL_COMMUNITY)
Admission: EM | Admit: 2021-08-26 | Discharge: 2021-08-26 | Disposition: A | Discharge status: Home / Self Care | Payer: No Typology Code available for payment source | Attending: Emergency Medicine | Admitting: Emergency Medicine

## 2021-08-26 DIAGNOSIS — S0101XA Laceration without foreign body of scalp, initial encounter: Secondary | ICD-10-CM | POA: Diagnosis not present

## 2021-08-26 DIAGNOSIS — S12401A Unspecified nondisplaced fracture of fifth cervical vertebra, initial encounter for closed fracture: Secondary | ICD-10-CM | POA: Diagnosis not present

## 2021-08-26 DIAGNOSIS — M25512 Pain in left shoulder: Secondary | ICD-10-CM | POA: Insufficient documentation

## 2021-08-26 DIAGNOSIS — S199XXA Unspecified injury of neck, initial encounter: Secondary | ICD-10-CM | POA: Diagnosis present

## 2021-08-26 DIAGNOSIS — S299XXA Unspecified injury of thorax, initial encounter: Secondary | ICD-10-CM | POA: Diagnosis not present

## 2021-08-26 DIAGNOSIS — S12201A Unspecified nondisplaced fracture of third cervical vertebra, initial encounter for closed fracture: Secondary | ICD-10-CM | POA: Diagnosis not present

## 2021-08-26 DIAGNOSIS — S3991XA Unspecified injury of abdomen, initial encounter: Secondary | ICD-10-CM | POA: Diagnosis not present

## 2021-08-26 DIAGNOSIS — Y92410 Unspecified street and highway as the place of occurrence of the external cause: Secondary | ICD-10-CM | POA: Diagnosis not present

## 2021-08-26 LAB — BASIC METABOLIC PANEL
Anion gap: 7 (ref 5–15)
BUN: 11 mg/dL (ref 6–20)
CO2: 24 mmol/L (ref 22–32)
Calcium: 9.6 mg/dL (ref 8.9–10.3)
Chloride: 107 mmol/L (ref 98–111)
Creatinine, Ser: 0.86 mg/dL (ref 0.61–1.24)
GFR, Estimated: >60 mL/min (ref >60)
Glucose, Bld: 99 mg/dL (ref 70–99)
Potassium: 3.9 mmol/L (ref 3.5–5.1)
Sodium: 138 mmol/L (ref 135–145)

## 2021-08-26 LAB — CBC WITH DIFFERENTIAL/PLATELET
Abs Immature Granulocytes: 0.02 10*3/uL (ref 0.00–0.07)
Basophils Absolute: 0.1 10*3/uL (ref 0.0–0.1)
Basophils Relative: 1 %
Eosinophils Absolute: 0.2 10*3/uL (ref 0.0–0.5)
Eosinophils Relative: 2 %
HCT: 43.8 % (ref 39.0–52.0)
Hemoglobin: 14.6 g/dL (ref 13.0–17.0)
Immature Granulocytes: 0 %
Lymphocytes Relative: 19 %
Lymphs Abs: 1.6 10*3/uL (ref 0.7–4.0)
MCH: 27.5 pg (ref 26.0–34.0)
MCHC: 33.3 g/dL (ref 30.0–36.0)
MCV: 82.5 fL (ref 80.0–100.0)
Monocytes Absolute: 0.6 10*3/uL (ref 0.1–1.0)
Monocytes Relative: 7 %
Neutro Abs: 6 10*3/uL (ref 1.7–7.7)
Neutrophils Relative %: 71 %
Platelets: 307 10*3/uL (ref 150–400)
RBC: 5.31 MIL/uL (ref 4.22–5.81)
RDW: 13.9 % (ref 11.5–15.5)
WBC: 8.4 10*3/uL (ref 4.0–10.5)
nRBC: 0 % (ref 0.0–0.2)

## 2021-08-26 LAB — RAPID URINE DRUG SCREEN, HOSP PERFORMED
Amphetamines: NOT DETECTED
Barbiturates: NOT DETECTED
Benzodiazepines: NOT DETECTED
Cocaine: NOT DETECTED
Opiates: NOT DETECTED
Tetrahydrocannabinol: POSITIVE — AB

## 2021-08-26 LAB — URINALYSIS, ROUTINE W REFLEX MICROSCOPIC
Bacteria, UA: NONE SEEN
Bilirubin Urine: NEGATIVE
Glucose, UA: NEGATIVE mg/dL
Hgb urine dipstick: NEGATIVE
Ketones, ur: 5 mg/dL — AB
Nitrite: NEGATIVE
Protein, ur: NEGATIVE mg/dL
Specific Gravity, Urine: 1.034 — ABNORMAL HIGH (ref 1.005–1.030)
pH: 5 (ref 5.0–8.0)

## 2021-08-26 LAB — I-STAT CHEM 8, ED
BUN: 9 mg/dL (ref 6–20)
Calcium, Ion: 1.25 mmol/L (ref 1.15–1.40)
Chloride: 102 mmol/L (ref 98–111)
Creatinine, Ser: 0.9 mg/dL (ref 0.61–1.24)
Glucose, Bld: 102 mg/dL — ABNORMAL HIGH (ref 70–99)
HCT: 45 % (ref 39.0–52.0)
Hemoglobin: 15.3 g/dL (ref 13.0–17.0)
Potassium: 3.9 mmol/L (ref 3.5–5.1)
Sodium: 139 mmol/L (ref 135–145)
TCO2: 25 mmol/L (ref 22–32)

## 2021-08-26 MED ORDER — CYCLOBENZAPRINE HCL 10 MG PO TABS: 10.0000 mg | ORAL_TABLET | Freq: Two times a day (BID) | ORAL | 0 refills | Status: DC | PRN

## 2021-08-26 MED ORDER — HYDROCODONE-ACETAMINOPHEN 5-325 MG PO TABS
1.0000 | ORAL_TABLET | Freq: Once | ORAL | Status: AC
  Administered 2021-08-26: 1 via ORAL
  Filled 2021-08-26: qty 1

## 2021-08-26 MED ORDER — SODIUM CHLORIDE (PF) 0.9 % IJ SOLN
INTRAMUSCULAR | Status: AC
  Filled 2021-08-26: qty 50

## 2021-08-26 MED ORDER — LIDOCAINE-EPINEPHRINE-TETRACAINE (LET) TOPICAL GEL
3.0000 mL | Freq: Once | TOPICAL | Status: AC
  Administered 2021-08-26: 3 mL via TOPICAL
  Filled 2021-08-26: qty 3

## 2021-08-26 MED ORDER — HYDROCODONE-ACETAMINOPHEN 5-325 MG PO TABS: 1.0000 | ORAL_TABLET | ORAL | 0 refills | Status: DC | PRN

## 2021-08-26 MED ORDER — IOHEXOL 300 MG/ML  SOLN
100.0000 mL | Freq: Once | INTRAMUSCULAR | Status: AC | PRN
  Administered 2021-08-26: 100 mL via INTRAVENOUS
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
## 2021-09-11 ENCOUNTER — Encounter (HOSPITAL_COMMUNITY): Payer: Self-pay | Admitting: Emergency Medicine

## 2021-09-11 ENCOUNTER — Emergency Department (HOSPITAL_COMMUNITY)
Admission: EM | Admit: 2021-09-11 | Discharge: 2021-09-11 | Disposition: A | Discharge status: Home / Self Care | Payer: No Typology Code available for payment source | Attending: Emergency Medicine | Admitting: Emergency Medicine

## 2021-09-11 DIAGNOSIS — Z4802 Encounter for removal of sutures: Secondary | ICD-10-CM | POA: Insufficient documentation

## 2021-09-11 NOTE — ED Triage Notes (Signed)
Pt here for staple removal on right side of head. Placed on 4/22.  ?

## 2021-09-11 NOTE — ED Provider Notes (Signed)
?[facility] EMERGENCY DEPARTMENT ?Provider Note ? ? ?CSN: [id number] ?Arrival date & time: 09/11/21  1215 ? ?  ? ?History ? ?Chief Complaint  ?Patient presents with  ? Suture / Staple Removal  ? ? ?William Fitzgerald is a 36 y.o. male presents to the emergency department for suture removal.  Patient reports that sutures were placed on 08/26/2021.  Patient denies any fever, chills, purulent discharge, rash, erythema, pain or discomfort to laceration site. ? ? ?Suture / Staple Removal ? ? ?  ? ?Home Medications ?Prior to Admission medications   ?Medication Sig Start Date End Date Taking? Authorizing Provider  ?cholecalciferol (VITAMIN D3) 25 MCG (1000 UNIT) tablet Take 3,000 Units by mouth daily.    [provider]  ?cyclobenzaprine (FLEXERIL) 10 MG tablet Take 1 tablet (10 mg total) by mouth 2 (two) times daily as needed for muscle spasms. 08/26/21   Darrick Grinder, PA-C  ?HYDROcodone-acetaminophen (NORCO/VICODIN) 5-325 MG tablet Take 1 tablet by mouth every 4 (four) hours as needed. 08/26/21   Darrick Grinder, PA-C  ?Ocrelizumab (OCREVUS IV) Inject into the vein every 6 (six) months.    [provider]  ?OVER THE COUNTER MEDICATION Take 1 Dose by mouth once. OTC cough syrup    [provider]  ?   ? ?Allergies    ?Gadolinium derivatives   ? ?Review of Systems   ?Review of Systems  ?Constitutional:  Negative for chills and fever.  ?Skin:  Positive for wound. Negative for color change, pallor and rash.  ? ?Physical Exam ?Updated Vital Signs ?Ht 6\' 6"  (1.981 m)   Wt (!) 158 kg   BMI 40.25 kg/m?  ?Physical Exam ?Vitals and nursing note reviewed.  ?Constitutional:   ?   General: He is not in acute distress. ?   Appearance: He is not ill-appearing, toxic-appearing or diaphoretic.  ?   Interventions: Cervical collar in place.  ?HENT:  ?   Head: Normocephalic.  ? ?   Comments: Well approximated wound to right scalp.  3 staples present.  No surrounding erythema, swelling, warmth  or purulent discharge. ?Eyes:  ?   General: No scleral icterus.    ?   Right eye: No discharge.     ?   Left eye: No discharge.  ?Cardiovascular:  ?   Rate and Rhythm: Normal rate.  ?Pulmonary:  ?   Effort: Pulmonary effort is normal.  ?Skin: ?   General: Skin is warm and dry.  ?Neurological:  ?   General: No focal deficit present.  ?   Mental Status: He is alert.  ?Psychiatric:     ?   Behavior: Behavior is cooperative.  ? ? ?ED Results / Procedures / Treatments   ?Labs ?(all labs ordered are listed, but only abnormal results are displayed) ?Labs Reviewed - No data to display ? ?EKG ?None ? ?Radiology ?No results found. ? ?Procedures ? Suture Removal ? ?Date/Time: 09/11/2021 12:42 PM ?Performed by: 11/11/2021, PA-C ?Authorized by: Haskel Schroeder, PA-C  ? ?Consent:  ?  Consent obtained:  Verbal ?  Consent given by:  Patient ?  Risks discussed:  Bleeding, pain and wound separation ?  Alternatives discussed:  No treatment ?Universal protocol:  ?  Procedure explained and questions answered to patient or proxy's satisfaction: yes   ?  Relevant documents present and verified: yes   ?  Required blood products, implants, devices, and special equipment available: yes   ?  Patient identity confirmed:  Verbally with patient and arm band ?Location:  ?  Location:  Head/neck ?Procedure details:  ?  Wound appearance:  No signs of infection, good wound healing, clean, nonpurulent and nontender ?  Number of staples removed:  3 ?Post-procedure details:  ?  Procedure completion:  Tolerated well, no immediate complications  ? ? ?Medications Ordered in ED ?Medications - No data to display ? ?ED Course/ Medical Decision Making/ A&P ?  ?                        ?Medical Decision Making ? ?Alert 36 year old male no acute stress, nontoxic-appearing.  Presents emergency department with a chief complaint of suture removal. ? ?Information obtained from patient.  Past medical records were reviewed including previous provider  notes, labs, and imaging. ? ?Per chart review patient had staples placed 08/26/2021.  No signs of infection.  Staples were removed as indicated above.  Patient advised to follow-up with PCP as needed. ? ?Based on patient's chief complaint, I considered admission might be necessary, however after reassuring ED workup feel patient is reasonable for discharge.  Discussed results, findings, treatment and follow up. Patient advised of return precautions. Patient verbalized understanding and agreed with plan. ? ?Portions of this note were generated with Scientist, clinical (histocompatibility and immunogenetics). Dictation errors may occur despite best attempts at proofreading. ? ? ? ? ? ? ? ? ?Final Clinical Impression(s) / ED Diagnoses ?Final diagnoses:  ?Visit for suture removal  ? ? ?Rx / DC Orders ?ED Discharge Orders   ? ? None  ? ?  ? ? ?  ?Haskel Schroeder, PA-C ?09/11/21 1243 ? ?  ?Ernie Avena, MD ?09/11/21 1336 ? ?

## 2021-09-11 NOTE — Discharge Instructions (Signed)
Get help right away if: ?You have a fever or chills. ?You have red streaks coming from your wound. ?

## 2021-09-15 ENCOUNTER — Encounter (INDEPENDENT_AMBULATORY_CARE_PROVIDER_SITE_OTHER): Payer: Self-pay | Admitting: Primary Care

## 2021-09-15 ENCOUNTER — Ambulatory Visit (INDEPENDENT_AMBULATORY_CARE_PROVIDER_SITE_OTHER): Payer: Self-pay | Admitting: Primary Care

## 2021-09-15 VITALS — BP 120/85 | HR 81 | Temp 98.1°F | Ht 78.0 in | Wt 342.8 lb

## 2021-09-15 DIAGNOSIS — Z7689 Persons encountering health services in other specified circumstances: Secondary | ICD-10-CM

## 2021-09-15 DIAGNOSIS — Z09 Encounter for follow-up examination after completed treatment for conditions other than malignant neoplasm: Secondary | ICD-10-CM
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
## 2021-10-06 ENCOUNTER — Ambulatory Visit (INDEPENDENT_AMBULATORY_CARE_PROVIDER_SITE_OTHER): Payer: Self-pay | Admitting: Primary Care

## 2021-12-04 ENCOUNTER — Telehealth: Payer: Self-pay | Admitting: *Deleted

## 2021-12-04 ENCOUNTER — Encounter: Payer: Self-pay | Admitting: *Deleted

## 2021-12-04 NOTE — Telephone Encounter (Signed)
Received fax from [employer]: need new Ocrevus start form for patient. Patient has not been seen since 03/2020. I called him and we scheduled for a follow up next week. He stated he called [place] after getting message in April, but could not remember if he has been approved for Ocrevus PAP. Called [employer] ocrevus access solutions, spoke with Ayly. She stated he is still active with patient [facility], but his record needs to be updated with current insurance, patient information and prescriber start form.  I informed her he has appointment next Mon, will  get information then. She verbalized understanding, appreciation.

## 2021-12-11 ENCOUNTER — Ambulatory Visit: Payer: Self-pay | Admitting: Diagnostic Neuroimaging

## 2022-01-11 ENCOUNTER — Telehealth: Payer: Self-pay | Admitting: *Deleted

## 2022-01-11 NOTE — Telephone Encounter (Signed)
Received fax from [employer] advising that the patient will no longer be eligible for PAP on 02/08/22. I called him and he stated he is still out of work, no insurance, was denied by [employer]. He  spoke to someone yesterday about financial assistance through [facility], has a form to fill out. I advised once he gets that assistance to call us and schedule a follow up. He was last seen 03/2020, will have to be seen before restarting MS treatment. Patient verbalized understanding, appreciation. Called [facility] spoke with Parminder and advised him of above. He will close case, stated if patient needs assistance in future he can fill out form again.

## 2022-01-15 NOTE — Telephone Encounter (Signed)
Called patient, advised Dr Marjory Lies wants him to follow up,  May need to get him on another agent temporarily. He agreed and we scheduled him to be seen Wed. Patient verbalized understanding, appreciation.

## 2022-01-17 ENCOUNTER — Encounter: Payer: Self-pay | Admitting: Diagnostic Neuroimaging

## 2022-01-17 ENCOUNTER — Telehealth: Payer: Self-pay | Admitting: *Deleted

## 2022-01-17 ENCOUNTER — Ambulatory Visit: Payer: Self-pay | Admitting: Diagnostic Neuroimaging

## 2022-01-17 VITALS — BP 129/84 | HR 70 | Ht 78.0 in | Wt 332.0 lb

## 2022-01-17 DIAGNOSIS — G35 Multiple sclerosis: Secondary | ICD-10-CM

## 2022-01-17 NOTE — Telephone Encounter (Signed)
Received fax from [employer]: they have received [employer] referral form. Contact # [phone].
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
## 2022-01-17 NOTE — Telephone Encounter (Signed)
Vumerity start form signed, dated by patient, completed by Dr Marjory Lies, faxed to [employer]. Received confirmation of fax.

## 2022-01-17 NOTE — Progress Notes (Signed)
Gave patient application for [facility] financial assistance.

## 2022-01-18 LAB — COMPREHENSIVE METABOLIC PANEL
ALT: 14 IU/L (ref 0–44)
AST: 16 IU/L (ref 0–40)
Albumin/Globulin Ratio: 1.8 (ref 1.2–2.2)
Albumin: 4.5 g/dL (ref 4.1–5.1)
Alkaline Phosphatase: 63 IU/L (ref 44–121)
BUN/Creatinine Ratio: 9 (ref 9–20)
BUN: 7 mg/dL (ref 6–20)
Bilirubin Total: 1.7 mg/dL — ABNORMAL HIGH (ref 0.0–1.2)
CO2: 24 mmol/L (ref 20–29)
Calcium: 10.1 mg/dL (ref 8.7–10.2)
Chloride: 103 mmol/L (ref 96–106)
Creatinine, Ser: 0.78 mg/dL (ref 0.76–1.27)
Globulin, Total: 2.5 g/dL (ref 1.5–4.5)
Glucose: 94 mg/dL (ref 70–99)
Potassium: 4.3 mmol/L (ref 3.5–5.2)
Sodium: 140 mmol/L (ref 134–144)
Total Protein: 7 g/dL (ref 6.0–8.5)
eGFR: 120 mL/min/{1.73_m2} (ref >59)

## 2022-01-18 LAB — CBC WITH DIFFERENTIAL/PLATELET
Basophils Absolute: 0.1 10*3/uL (ref 0.0–0.2)
Basos: 1 %
EOS (ABSOLUTE): 0.1 10*3/uL (ref 0.0–0.4)
Eos: 1 %
Hematocrit: 44.3 % (ref 37.5–51.0)
Hemoglobin: 14.2 g/dL (ref 13.0–17.7)
Immature Grans (Abs): 0 10*3/uL (ref 0.0–0.1)
Immature Granulocytes: 0 %
Lymphocytes Absolute: 1.5 10*3/uL (ref 0.7–3.1)
Lymphs: 21 %
MCH: 27.1 pg (ref 26.6–33.0)
MCHC: 32.1 g/dL (ref 31.5–35.7)
MCV: 85 fL (ref 79–97)
Monocytes Absolute: 0.5 10*3/uL (ref 0.1–0.9)
Monocytes: 7 %
Neutrophils Absolute: 4.8 10*3/uL (ref 1.4–7.0)
Neutrophils: 70 %
Platelets: 312 10*3/uL (ref 150–450)
RBC: 5.24 x10E6/uL (ref 4.14–5.80)
RDW: 13.3 % (ref 11.6–15.4)
WBC: 6.9 10*3/uL (ref 3.4–10.8)

## 2022-01-23 MED ORDER — VUMERITY 231 MG PO CPDR: 231.0000 mg | DELAYED_RELEASE_CAPSULE | Freq: Two times a day (BID) | ORAL | 0 refills | Status: DC

## 2022-01-23 MED ORDER — VUMERITY 231 MG PO CPDR: 462.0000 mg | DELAYED_RELEASE_CAPSULE | Freq: Two times a day (BID) | ORAL | 3 refills | Status: DC

## 2022-01-23 NOTE — Addendum Note (Signed)
Addended by: [place] A on: 01/23/2022 02:23 PM   Modules accepted: Orders

## 2022-01-23 NOTE — Telephone Encounter (Signed)
[facility])  request a new prescription for starter dose for Vumerity. Prescription we received is only for maintenance . Would like a call from the nurse.  Contact info: [phone] ext: [phone]

## 2022-01-23 NOTE — Telephone Encounter (Signed)
RXs for vumerity starter dose of 231mg  BID x 7 days and then maintenance dose of 462mg  BID sent to [facility].

## 2022-01-29 ENCOUNTER — Telehealth: Payer: Self-pay | Admitting: *Deleted

## 2022-01-29 NOTE — Telephone Encounter (Signed)
I called patient. He reports that he received his Vumerity and is tolerating it well. I reminded him of his appointment in February of 2024. He will call us with interim questions or concerns.

## 2022-01-29 NOTE — Telephone Encounter (Signed)
Received fax from [place] [facility] drug program re: need Vumerity maintenance Rx. Form on MD desk for signature when he returns to office tomorrow.

## 2022-01-30 NOTE — Telephone Encounter (Signed)
Vumerity Rx signed, faxed to [place]. Received confirmation.

## 2022-06-18 ENCOUNTER — Ambulatory Visit: Payer: Self-pay | Admitting: Diagnostic Neuroimaging

## 2022-07-24 ENCOUNTER — Encounter: Payer: Self-pay | Admitting: Diagnostic Neuroimaging

## 2022-07-24 ENCOUNTER — Ambulatory Visit: Payer: Medicaid Other | Admitting: Diagnostic Neuroimaging

## 2022-07-24 VITALS — BP 128/79 | HR 64 | Ht 77.0 in | Wt 321.0 lb

## 2022-07-24 DIAGNOSIS — G35 Multiple sclerosis: Secondary | ICD-10-CM

## 2022-07-24 NOTE — Patient Instructions (Signed)
  MULTIPLE SCLEROSIS (stable) - continue vumerity; check labs - continue vitamin D
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
## 2022-07-25 LAB — CBC WITH DIFFERENTIAL/PLATELET
Basophils Absolute: 0.1 10*3/uL (ref 0.0–0.2)
Basos: 1 %
EOS (ABSOLUTE): 0.1 10*3/uL (ref 0.0–0.4)
Eos: 2 %
Hematocrit: 44.5 % (ref 37.5–51.0)
Hemoglobin: 13.9 g/dL (ref 13.0–17.7)
Immature Grans (Abs): 0 10*3/uL (ref 0.0–0.1)
Immature Granulocytes: 0 %
Lymphocytes Absolute: 1.9 10*3/uL (ref 0.7–3.1)
Lymphs: 24 %
MCH: 26.4 pg — ABNORMAL LOW (ref 26.6–33.0)
MCHC: 31.2 g/dL — ABNORMAL LOW (ref 31.5–35.7)
MCV: 85 fL (ref 79–97)
Monocytes Absolute: 0.5 10*3/uL (ref 0.1–0.9)
Monocytes: 7 %
Neutrophils Absolute: 5.1 10*3/uL (ref 1.4–7.0)
Neutrophils: 66 %
Platelets: 341 10*3/uL (ref 150–450)
RBC: 5.26 x10E6/uL (ref 4.14–5.80)
RDW: 13.2 % (ref 11.6–15.4)
WBC: 7.7 10*3/uL (ref 3.4–10.8)

## 2022-07-25 LAB — COMPREHENSIVE METABOLIC PANEL
ALT: 14 IU/L (ref 0–44)
AST: 18 IU/L (ref 0–40)
Albumin/Globulin Ratio: 2 (ref 1.2–2.2)
Albumin: 4.5 g/dL (ref 4.1–5.1)
Alkaline Phosphatase: 59 IU/L (ref 44–121)
BUN/Creatinine Ratio: 9 (ref 9–20)
BUN: 7 mg/dL (ref 6–20)
Bilirubin Total: 2 mg/dL — ABNORMAL HIGH (ref 0.0–1.2)
CO2: 24 mmol/L (ref 20–29)
Calcium: 9.9 mg/dL (ref 8.7–10.2)
Chloride: 103 mmol/L (ref 96–106)
Creatinine, Ser: 0.74 mg/dL — ABNORMAL LOW (ref 0.76–1.27)
Globulin, Total: 2.3 g/dL (ref 1.5–4.5)
Glucose: 82 mg/dL (ref 70–99)
Potassium: 4.7 mmol/L (ref 3.5–5.2)
Sodium: 141 mmol/L (ref 134–144)
Total Protein: 6.8 g/dL (ref 6.0–8.5)
eGFR: 122 mL/min/{1.73_m2} (ref >59)

## 2022-07-25 LAB — VITAMIN D 25 HYDROXY (VIT D DEFICIENCY, FRACTURES): Vit D, 25-Hydroxy: 26.6 ng/mL — ABNORMAL LOW (ref 30.0–100.0)

## 2022-08-08 NOTE — Telephone Encounter (Signed)
PA submitted via CMM Key: [id number].  Your information has been sent to [employer]. Awaiting determination

## 2022-08-09 ENCOUNTER — Ambulatory Visit (INDEPENDENT_AMBULATORY_CARE_PROVIDER_SITE_OTHER): Payer: Self-pay

## 2022-08-09 ENCOUNTER — Encounter (INDEPENDENT_AMBULATORY_CARE_PROVIDER_SITE_OTHER): Payer: No Typology Code available for payment source | Admitting: Primary Care

## 2022-08-09 NOTE — Telephone Encounter (Signed)
  Chief Complaint: Nightly headaches - right side Symptoms: above Frequency: 1 week Pertinent Negatives: Patient denies  Disposition: [] ED /[] Urgent Care (no appt availability in office) / [x] Appointment(In office/virtual)/ []  [facility]/ [] Home Care/ [] Refused Recommended Disposition /[] [facility]/ []  Follow-up with PCP Additional Notes: PT states that he has been having HAs every night on the right side of his head.  HA is behind his eye to the back of his head and down to his ear.    Reason for Disposition  [1] MODERATE headache (e.g., interferes with normal activities) AND [2] present > 24 hours AND [3] unexplained  (Exceptions: analgesics not tried, typical migraine, or headache part of viral illness)  Answer Assessment - Initial Assessment Questions 1. LOCATION: "Where does it hurt?"      Right side 2. ONSET: "When did the headache start?" (Minutes, hours or days)      1 week ago 3. PATTERN: "Does the pain come and go, or has it been constant since it started?"     Nightly it starts  - starts at eye 4. SEVERITY: "How bad is the pain?" and "What does it keep you from doing?"  (e.g., Scale 1-10; mild, moderate, or severe)   - MILD (1-3): doesn't interfere with normal activities    - MODERATE (4-7): interferes with normal activities or awakens from sleep    - SEVERE (8-10): excruciating pain, unable to do any normal activities        10/10 5. RECURRENT SYMPTOM: "Have you ever had headaches before?" If Yes, ask: "When was the last time?" and "What happened that time?"      no 6. CAUSE: "What do you think is causing the headache?"     Unsure 7. MIGRAINE: "Have you been diagnosed with migraine headaches?" If Yes, ask: "Is this headache similar?"      no 8. HEAD INJURY: "Has there been any recent injury to the head?"      no 9. OTHER SYMPTOMS: "Do you have any other symptoms?" (fever, stiff neck, eye pain, sore throat, cold symptoms)     MS pt  Protocols  used: Headache-A-AH

## 2022-08-13 ENCOUNTER — Encounter: Payer: Self-pay | Admitting: Diagnostic Neuroimaging

## 2022-08-13 ENCOUNTER — Ambulatory Visit (INDEPENDENT_AMBULATORY_CARE_PROVIDER_SITE_OTHER): Payer: No Typology Code available for payment source | Admitting: Primary Care

## 2022-08-13 ENCOUNTER — Telehealth: Payer: Self-pay | Admitting: Diagnostic Neuroimaging

## 2022-08-13 DIAGNOSIS — G35 Multiple sclerosis: Secondary | ICD-10-CM

## 2022-08-13 DIAGNOSIS — R519 Headache, unspecified: Secondary | ICD-10-CM

## 2022-08-13 NOTE — Telephone Encounter (Addendum)
Pt states he has missed his medications for insurance issues for about a week so he is not sure if that is the cause of the sever headaches he has been having.  Pt states they have been so bad that he has been awaken from his sleep.  Pt would like to know if a MRI could be ordered.  Pt stated when asked that no, he has not been to ED, please call. Pt  was encouraged to go to ED if he feels the need and to not wait for a call back.

## 2022-08-13 NOTE — Telephone Encounter (Signed)
Request Reference Number: [id number]. VUMERITY CAP [id number]  is denied for not meeting the prior authorization requirements. E-appeal has been submitted, awaiting determination.

## 2022-08-14 NOTE — Telephone Encounter (Signed)
Called the patient and there was no answer. LVM advising I was calling the patient back to review his concern. Dr Marjory Lies is recommending for sudden headache the patient complete a CT head asap. Advised will send a mychart message to the pt as well.

## 2022-08-14 NOTE — Addendum Note (Signed)
Addended by: Judi Cong on: 08/14/2022 11:33 AM   Modules accepted: Orders

## 2022-08-14 NOTE — Telephone Encounter (Signed)
Pt returned call and I was able to review the recommendation from Dr Marjory Lies advising the pt get a head CT STAT order has been placed and number to GI given to the pt to call and make sure he can head over to complete.  Pt also given [employer] number for him to call and see if there is a option to get a bridge of the medication while waiting on the appeal determination Pt verbalized understanding.

## 2022-08-18 NOTE — Progress Notes (Signed)
No visit

## 2022-08-20 NOTE — Telephone Encounter (Signed)
[facility] says his insurance denied the CT because they said that a MRI is better for his diagnosis codes. Do you want me to get a MRI authorized instead and send it to the hospital to get scheduled? I have the denial to print too if you want it.

## 2022-08-21 NOTE — Telephone Encounter (Signed)
I called and spoke to [facility] in [place].  She said that insurance denied.  I relayed that we send pts to imaging for CT without (as walkin).  Will need peer to peer, see note from MRI auth (tori).

## 2022-08-21 NOTE — Telephone Encounter (Signed)
LMVM for [facility] to return call about the CT head for this pt.

## 2022-08-21 NOTE — Telephone Encounter (Signed)
I tried to submit this again as expedited and it was denied again. The diagnosis code on the order for the headache is what I used and this is what [facility] said: Here are the policy requirements your request did not meet: For 16[address] CT HEAD/BRAIN W/O CONTRAST Your doctor told us that you have a headache. An  imaging study was asked for.We cannot approve this request because: The type of picture study requested is not supported for your reported condition. The type of  picture study(ies) supported is listed in the guideline. Different imaging studies (70551-Magnetic Resonance Imaging (MRI), a special kind of picture  of your head without dye and 70553-Magnetic Resonance Imaging (MRI), a special kind of  picture of your head before and after contrast (dye)) are more likely to show your doctor all of  the details they need to see in order to treat you. These studies will be approved if requested. If you have any questions about this decision please call the Medical Director at [phone]. You may ask for reconsideration by the Medical Director and discuss the decision. Case #[id number]    I also tried to submit a CT head with and without contrast, but it also required notes and was sent to medical review.

## 2022-08-21 NOTE — Addendum Note (Signed)
Addended by: Joycelyn Schmid R on: 08/21/2022 12:16 PM   Modules accepted: Orders

## 2022-08-21 NOTE — Telephone Encounter (Signed)
Noted  

## 2022-08-21 NOTE — Telephone Encounter (Signed)
Orders Placed This Encounter  Procedures   MR BRAIN W WO CONTRAST   Suanne Marker, MD 08/21/2022, 12:15 PM Certified in Neurology, Neurophysiology and Neuroimaging  [facility] [address] [place], [place] [address] [phone]

## 2022-08-22 NOTE — Telephone Encounter (Signed)
[employer] medicaid Berkley Harvey: [id number] exp. 08/22/22-10/06/22 sent to GI [phone]

## 2022-08-22 NOTE — Telephone Encounter (Signed)
Sent the patient a message advising of the MRI being ordered

## 2022-08-27 NOTE — Telephone Encounter (Signed)
Contacted [employer], spoke to [place] to check on the status of appeal. She stated it is still in process and decision has not been made yet. She stated decision should be in by next week based off of status.  Ref [id number]

## 2022-08-28 ENCOUNTER — Telehealth: Payer: Self-pay

## 2022-08-28 NOTE — Telephone Encounter (Signed)
Called patient and informed him that his Vit D still slightly low. Patient states he doesn't take his Vit D everyday but he will make sure to start. Patient should continue current plan discuss during his Visit. Pt verbalized understanding. Pt had no questions at this time but was encouraged to call back if questions arise.

## 2022-08-28 NOTE — Telephone Encounter (Signed)
-----   Message from Suanne Marker, MD sent at 08/27/2022  5:51 PM EDT ----- Unremarkable labs; except Vit D still slightly low. Continue current plan. Please call patient. -VRP

## 2022-08-29 ENCOUNTER — Other Ambulatory Visit: Payer: Self-pay

## 2022-08-29 MED ORDER — VUMERITY 231 MG PO CPDR: 462.0000 mg | DELAYED_RELEASE_CAPSULE | Freq: Two times a day (BID) | ORAL | 3 refills | Status: AC

## 2022-08-29 NOTE — Telephone Encounter (Signed)
Appeal approval received via CMM, no dates available. Waiting on fax

## 2022-10-03 ENCOUNTER — Telehealth: Payer: Self-pay | Admitting: Diagnostic Neuroimaging

## 2022-10-03 NOTE — Telephone Encounter (Signed)
Sent mychart msg informing pt of appt change due to provider schedule change 

## 2022-10-05 ENCOUNTER — Ambulatory Visit
Admission: RE | Admit: 2022-10-05 | Discharge: 2022-10-05 | Disposition: A | Discharge status: Home / Self Care | Payer: Medicaid Other | Source: Ambulatory Visit | Attending: Diagnostic Neuroimaging | Admitting: Diagnostic Neuroimaging

## 2022-10-05 DIAGNOSIS — G35 Multiple sclerosis: Secondary | ICD-10-CM

## 2022-10-05 DIAGNOSIS — R519 Headache, unspecified: Secondary | ICD-10-CM

## 2022-10-05 MED ORDER — GADOPICLENOL 0.5 MMOL/ML IV SOLN
10.0000 mL | Freq: Once | INTRAVENOUS | Status: AC | PRN
  Administered 2022-10-05: 10 mL via INTRAVENOUS

## 2023-02-18 ENCOUNTER — Telehealth: Payer: Self-pay

## 2023-02-18 NOTE — Telephone Encounter (Signed)
[employer] faxes mentioning they have made several attempts to contact the patient about her Vumerity Rx. #[id number]

## 2023-03-22 ENCOUNTER — Other Ambulatory Visit

## 2023-06-24 ENCOUNTER — Telehealth: Payer: Self-pay

## 2023-06-24 NOTE — Telephone Encounter (Signed)
 Received fax from [employer] stating they  have made several attempts to contact the patient about his Vumerity and has been unable to reach pt. I Attempted to contact pt to discuss, LVM rq a call back. Office hours provided.

## 2023-07-22 ENCOUNTER — Telehealth: Payer: Self-pay | Admitting: Diagnostic Neuroimaging

## 2023-07-22 ENCOUNTER — Ambulatory Visit: Payer: Medicaid Other | Admitting: Diagnostic Neuroimaging

## 2023-07-22 NOTE — Telephone Encounter (Signed)
 Pt rescheduling appt for personal reason

## 2023-07-25 ENCOUNTER — Ambulatory Visit: Payer: Medicaid Other | Admitting: Diagnostic Neuroimaging

## 2023-12-26 ENCOUNTER — Ambulatory Visit: Payer: Self-pay | Admitting: *Deleted
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
## 2023-12-27 ENCOUNTER — Encounter (HOSPITAL_BASED_OUTPATIENT_CLINIC_OR_DEPARTMENT_OTHER): Payer: Self-pay | Admitting: Physician Assistant

## 2023-12-27 ENCOUNTER — Ambulatory Visit (HOSPITAL_BASED_OUTPATIENT_CLINIC_OR_DEPARTMENT_OTHER): Admitting: Physician Assistant

## 2023-12-27 ENCOUNTER — Other Ambulatory Visit (HOSPITAL_BASED_OUTPATIENT_CLINIC_OR_DEPARTMENT_OTHER): Payer: Self-pay | Admitting: Physician Assistant

## 2023-12-27 ENCOUNTER — Ambulatory Visit (INDEPENDENT_AMBULATORY_CARE_PROVIDER_SITE_OTHER)

## 2023-12-27 DIAGNOSIS — M542 Cervicalgia: Secondary | ICD-10-CM

## 2023-12-27 MED ORDER — METHOCARBAMOL 500 MG PO TABS: 500.0000 mg | ORAL_TABLET | Freq: Four times a day (QID) | ORAL | 0 refills | Status: AC | PRN

## 2023-12-27 MED ORDER — MELOXICAM 15 MG PO TABS: 15.0000 mg | ORAL_TABLET | Freq: Every day | ORAL | 0 refills | Status: AC
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
## 2024-01-06 ENCOUNTER — Other Ambulatory Visit: Payer: Self-pay

## 2024-01-06 ENCOUNTER — Ambulatory Visit
Admission: EM | Admit: 2024-01-06 | Discharge: 2024-01-06 | Disposition: A | Discharge status: Home / Self Care | Attending: Physician Assistant | Admitting: Physician Assistant

## 2024-01-06 DIAGNOSIS — R079 Chest pain, unspecified: Secondary | ICD-10-CM

## 2024-01-06 DIAGNOSIS — K219 Gastro-esophageal reflux disease without esophagitis: Secondary | ICD-10-CM

## 2024-01-06 MED ORDER — ALUM & MAG HYDROXIDE-SIMETH 200-200-20 MG/5ML PO SUSP
30.0000 mL | Freq: Once | ORAL | Status: AC
  Administered 2024-01-06: 30 mL via ORAL

## 2024-01-06 MED ORDER — FAMOTIDINE 20 MG PO TABS: 20.0000 mg | ORAL_TABLET | Freq: Every day | ORAL | 1 refills | Status: DC

## 2024-01-06 MED ORDER — ACETAMINOPHEN 325 MG PO TABS
650.0000 mg | ORAL_TABLET | Freq: Once | ORAL | Status: AC
  Administered 2024-01-06: 650 mg via ORAL
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
## 2024-01-06 NOTE — ED Notes (Signed)
 This RN checked on patient at this time. Pt states he was feeling a little bit better following medication administration. PA will be made aware.

## 2024-01-06 NOTE — Discharge Instructions (Addendum)
 You are seen today for concerns of left-sided chest pain that has been ongoing for several weeks and recently became worse.  Your EKG was reassuring today and does not show evidence of a heart attack.  While your EKG is reassuring I cannot definitively say that you are not having some kind of cardiac condition without a full workup with labs and monitoring.  These labs and monitoring are performed in the emergency room so if you are still having concerns I recommend going there for further evaluation.  Based on your presentation as well as your symptoms I suspect that your chest pain may be secondary to acid reflux.  Your symptoms seem to improve following administration of Tylenol  and Maalox/Mylanta.  I am sending in a prescription for a medication called famotidine  which helps reduce stomach acid.  Please take this once per day for at least a month to see if this helps improve your symptoms.  If you have acute flares I recommend using Pepto-Bismol or Tums to further assist with symptoms. If you feel like your symptoms are not improving or seem to be getting worse again I recommend following up with your PCP as you may need a long-term cardiac monitor as well as further evaluation to make sure that everything is okay with your heart

## 2024-01-06 NOTE — ED Triage Notes (Addendum)
 Pt presents with a chief complaint of sharp, left-sided chest pain x 3 weeks. Pt states the pain has been coming and going however is now more consistent. Chest pain is accompanied with some SOB, dizziness, lightheadedness, and weakness. Currently rates overall pain in chest a 6/10. VSS. Pt does not appear to be in any distress at this time. Pt mentions he notices the pain more when lifting objects.

## 2024-01-06 NOTE — ED Notes (Signed)
 EKG handed off to [facility].

## 2024-01-13 ENCOUNTER — Other Ambulatory Visit: Payer: Self-pay

## 2024-01-13 ENCOUNTER — Ambulatory Visit: Attending: Primary Care

## 2024-01-13 DIAGNOSIS — R293 Abnormal posture: Secondary | ICD-10-CM | POA: Diagnosis present

## 2024-01-13 DIAGNOSIS — M6281 Muscle weakness (generalized): Secondary | ICD-10-CM | POA: Diagnosis present

## 2024-01-13 DIAGNOSIS — M542 Cervicalgia: Secondary | ICD-10-CM | POA: Diagnosis present
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
## 2024-01-22 ENCOUNTER — Encounter: Payer: Self-pay | Admitting: Physical Therapy

## 2024-01-22 ENCOUNTER — Ambulatory Visit: Admitting: Physical Therapy

## 2024-01-22 DIAGNOSIS — R293 Abnormal posture: Secondary | ICD-10-CM

## 2024-01-22 DIAGNOSIS — M542 Cervicalgia: Secondary | ICD-10-CM

## 2024-01-22 DIAGNOSIS — M6281 Muscle weakness (generalized): Secondary | ICD-10-CM
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
## 2024-01-23 ENCOUNTER — Encounter: Payer: Self-pay | Admitting: Physical Therapy

## 2024-01-23 ENCOUNTER — Ambulatory Visit: Admitting: Physical Therapy

## 2024-01-23 DIAGNOSIS — M542 Cervicalgia: Secondary | ICD-10-CM

## 2024-01-23 DIAGNOSIS — M6281 Muscle weakness (generalized): Secondary | ICD-10-CM

## 2024-01-23 DIAGNOSIS — R293 Abnormal posture: Secondary | ICD-10-CM
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
## 2024-01-27 ENCOUNTER — Ambulatory Visit (INDEPENDENT_AMBULATORY_CARE_PROVIDER_SITE_OTHER): Admitting: Physical Medicine and Rehabilitation

## 2024-01-27 ENCOUNTER — Encounter: Payer: Self-pay | Admitting: Physical Medicine and Rehabilitation

## 2024-01-27 ENCOUNTER — Ambulatory Visit: Admitting: Physical Therapy

## 2024-01-27 DIAGNOSIS — M7918 Myalgia, other site: Secondary | ICD-10-CM

## 2024-01-27 DIAGNOSIS — G8929 Other chronic pain: Secondary | ICD-10-CM

## 2024-01-27 DIAGNOSIS — M546 Pain in thoracic spine: Secondary | ICD-10-CM | POA: Diagnosis not present

## 2024-01-27 DIAGNOSIS — M542 Cervicalgia: Secondary | ICD-10-CM

## 2024-01-27 NOTE — Progress Notes (Unsigned)
 Pain Scale   Average Pain 5 Patient advised that he chronic neck pain that increases when driving and radiates to middle.        +Driver, -BT, -Dye Allergies.
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
## 2024-01-30 ENCOUNTER — Ambulatory Visit

## 2024-01-30 DIAGNOSIS — M6281 Muscle weakness (generalized): Secondary | ICD-10-CM

## 2024-01-30 DIAGNOSIS — R293 Abnormal posture: Secondary | ICD-10-CM

## 2024-01-30 DIAGNOSIS — M542 Cervicalgia: Secondary | ICD-10-CM

## 2024-02-03 ENCOUNTER — Ambulatory Visit

## 2024-02-03 DIAGNOSIS — M542 Cervicalgia: Secondary | ICD-10-CM

## 2024-02-03 DIAGNOSIS — M6281 Muscle weakness (generalized): Secondary | ICD-10-CM

## 2024-02-03 DIAGNOSIS — R293 Abnormal posture: Secondary | ICD-10-CM
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
## 2024-02-04 ENCOUNTER — Encounter: Payer: Self-pay | Admitting: Diagnostic Neuroimaging

## 2024-02-04 ENCOUNTER — Ambulatory Visit: Admitting: Diagnostic Neuroimaging

## 2024-02-04 VITALS — BP 128/90 | HR 66 | Ht 77.0 in | Wt 363.8 lb

## 2024-02-04 DIAGNOSIS — G35 Multiple sclerosis: Secondary | ICD-10-CM | POA: Diagnosis not present

## 2024-02-04 DIAGNOSIS — G35A Relapsing-remitting multiple sclerosis: Secondary | ICD-10-CM

## 2024-02-04 DIAGNOSIS — G35D Multiple sclerosis, unspecified: Secondary | ICD-10-CM
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
## 2024-02-05 ENCOUNTER — Telehealth: Payer: Self-pay

## 2024-02-05 NOTE — Telephone Encounter (Signed)
 Cld Pt to make him aware of info being sent via [facility] in which requires a response. No answer, LVM and ofc # if Pt has questions.

## 2024-02-06 ENCOUNTER — Ambulatory Visit

## 2024-02-06 ENCOUNTER — Telehealth: Payer: Self-pay

## 2024-02-06 DIAGNOSIS — G35D Multiple sclerosis, unspecified: Secondary | ICD-10-CM

## 2024-02-06 LAB — COMPREHENSIVE METABOLIC PANEL WITH GFR
ALT: 8 IU/L (ref 0–44)
AST: 16 IU/L (ref 0–40)
Albumin: 4.2 g/dL (ref 4.1–5.1)
Alkaline Phosphatase: 56 IU/L (ref 47–123)
BUN/Creatinine Ratio: 11 (ref 9–20)
BUN: 10 mg/dL (ref 6–20)
Bilirubin Total: 1.9 mg/dL — ABNORMAL HIGH (ref 0.0–1.2)
CO2: 20 mmol/L (ref 20–29)
Calcium: 9.5 mg/dL (ref 8.7–10.2)
Chloride: 106 mmol/L (ref 96–106)
Creatinine, Ser: 0.88 mg/dL (ref 0.76–1.27)
Globulin, Total: 2.4 g/dL (ref 1.5–4.5)
Glucose: 95 mg/dL (ref 70–99)
Potassium: 4.1 mmol/L (ref 3.5–5.2)
Sodium: 139 mmol/L (ref 134–144)
Total Protein: 6.6 g/dL (ref 6.0–8.5)
eGFR: 114 mL/min/1.73 (ref >59)

## 2024-02-06 LAB — CBC WITH DIFFERENTIAL/PLATELET
Basophils Absolute: 0.1 x10E3/uL (ref 0.0–0.2)
Basos: 1 %
EOS (ABSOLUTE): 0.1 x10E3/uL (ref 0.0–0.4)
Eos: 1 %
Hematocrit: 43.6 % (ref 37.5–51.0)
Hemoglobin: 13.9 g/dL (ref 13.0–17.7)
Immature Grans (Abs): 0 x10E3/uL (ref 0.0–0.1)
Immature Granulocytes: 0 %
Lymphocytes Absolute: 1.7 x10E3/uL (ref 0.7–3.1)
Lymphs: 25 %
MCH: 26.6 pg (ref 26.6–33.0)
MCHC: 31.9 g/dL (ref 31.5–35.7)
MCV: 83 fL (ref 79–97)
Monocytes Absolute: 0.5 x10E3/uL (ref 0.1–0.9)
Monocytes: 7 %
Neutrophils Absolute: 4.3 x10E3/uL (ref 1.4–7.0)
Neutrophils: 66 %
Platelets: 295 x10E3/uL (ref 150–450)
RBC: 5.23 x10E6/uL (ref 4.14–5.80)
RDW: 13.7 % (ref 11.6–15.4)
WBC: 6.6 x10E3/uL (ref 3.4–10.8)

## 2024-02-06 LAB — IMMUNOGLOBULINS A/E/G/M, SERUM
IgA/Immunoglobulin A, Serum: 428 mg/dL — ABNORMAL HIGH (ref 90–386)
IgE (Immunoglobulin E), Serum: 68 [IU]/mL (ref 6–495)
IgG (Immunoglobin G), Serum: 983 mg/dL (ref 603–1613)
IgM (Immunoglobulin M), Srm: 56 mg/dL (ref 20–172)

## 2024-02-06 NOTE — Telephone Encounter (Signed)
 Spoke w/Pt who stated he completed the Pt portion of the start form for Ocrevus  and received a confirmation email it was completed. Thanked Pt for info and informed him we will get the paperwork completed on [facility] side and get sent in. Pt voiced understanding and thanks.

## 2024-02-06 NOTE — Telephone Encounter (Signed)
 Ocrevus  start form completed and faxed to [employer]. Rec'd transmission confirmation. Order completed and signed. New start and order given to [facility].

## 2024-02-09 ENCOUNTER — Ambulatory Visit
Admission: RE | Admit: 2024-02-09 | Discharge: 2024-02-09 | Disposition: A | Source: Ambulatory Visit | Attending: Physical Medicine and Rehabilitation | Admitting: Physical Medicine and Rehabilitation

## 2024-02-10 ENCOUNTER — Encounter: Admitting: Physical Therapy

## 2024-02-12 NOTE — Telephone Encounter (Signed)
 [facility] is not able to infuse patient due to insurance. Can you follow-up on this as far as infusing elsewhere?

## 2024-02-13 ENCOUNTER — Encounter

## 2024-02-13 NOTE — Telephone Encounter (Signed)
 I called [employer]. Last 4 digits of call ref # is [id number]. For J2350, no prior authorization is required for J2350 using dx code G35 at all places of service.  Completed PA for Ocrevus  via [facility]. Sent to [facility] [employer]. Key: [id number]. Should have a determination within 3- business days.

## 2024-02-19 NOTE — Telephone Encounter (Signed)
 PA for Ocrevus  has been denied by [facility] for not meeting formulary requirements. Will attempt an appeal.

## 2024-02-20 NOTE — Telephone Encounter (Signed)
 Appeal letter has been signed by Dr Margaret and patient's appeal consent form has been placed at the front desk for patient signature.

## 2024-02-21 ENCOUNTER — Ambulatory Visit: Payer: Self-pay | Admitting: Diagnostic Neuroimaging

## 2024-02-21 NOTE — Telephone Encounter (Signed)
 Received signed patient form from front staff. Appeal has been faxed to [facility] community plan with urgent flag on cover sheet. Received a receipt of confirmation.

## 2024-02-27 NOTE — Telephone Encounter (Signed)
 I called [employer]. I spoke with Carlin BIRCH. The urgent appeal has not been received. Turnaround time is usually 72  hours. He gave me another fax for expedited appeals:  Fax #[phone]. Please include the PA # [id number] and member's ID of [id number] C and DOB on the appeal.  Call Ref #: [id number]

## 2024-02-27 NOTE — Telephone Encounter (Signed)
 Appeal faxed to new number included below [phone] and I included the PA # on the cover sheet.

## 2024-03-09 ENCOUNTER — Encounter: Payer: Self-pay | Admitting: Radiology

## 2024-03-11 NOTE — Telephone Encounter (Signed)
 Faxed the appeal (includes patient consent page and signed appeal letter) to [facility] at [phone]. The cover sheet is marked urgent and I have also included pt's ID #, DOB, and PA # of [id number].

## 2024-03-11 NOTE — Telephone Encounter (Signed)
 I called [employer] I spoke with Kayma.  She reports that she cannot find the pharmacy appeal.  She asked that we refax this expedited appeal to fax number: [phone].  We must include the patient consent and the appeal letter.  Please include the PA number of [id number] and member's ID of [id number] C and his DOB.

## 2024-03-12 NOTE — Addendum Note (Signed)
 Addended by: MARGARET CARNE R on: 03/12/2024 06:09 PM   Modules accepted: Orders

## 2024-03-12 NOTE — Telephone Encounter (Signed)
 Cassandra from appeals dept from [employer] called to give Ocrevus  approval into as of Oct 18th for 1 year, her call back # if there are questions is [phone] this has a auth# of [id number]

## 2024-03-12 NOTE — Telephone Encounter (Addendum)
 Dr. Margaret- Pt is restarting Ocrevus . We have to place therapy plan order (I can help with this). However, I saw where last Hep B labs 2017    Did you want to have him get these rechecked first prior to starting therapy? Dr. Sater has recommended at least every 2 years to have these updated.

## 2024-03-13 NOTE — Telephone Encounter (Signed)
 I called the patient and left a message for him to call the office back regarding getting bloodwork done.

## 2024-03-13 NOTE — Telephone Encounter (Signed)
 Patient called back and he plan to come next week. He was unable to make it today 03/13/24 before noon.

## 2024-03-20 ENCOUNTER — Other Ambulatory Visit (INDEPENDENT_AMBULATORY_CARE_PROVIDER_SITE_OTHER): Payer: Self-pay

## 2024-03-20 ENCOUNTER — Other Ambulatory Visit: Payer: Self-pay

## 2024-03-20 DIAGNOSIS — Z0289 Encounter for other administrative examinations: Secondary | ICD-10-CM

## 2024-03-20 DIAGNOSIS — G35D Multiple sclerosis, unspecified: Secondary | ICD-10-CM

## 2024-03-21 LAB — HEPATITIS B SURFACE ANTIGEN: Hepatitis B Surface Ag: NEGATIVE

## 2024-03-21 LAB — HEPATITIS B SURFACE ANTIBODY,QUALITATIVE: Hep B Surface Ab, Qual: REACTIVE

## 2024-03-26 ENCOUNTER — Other Ambulatory Visit: Payer: Self-pay | Admitting: *Deleted

## 2024-03-26 NOTE — Telephone Encounter (Signed)
 Placed therapy plan order for pt to go to [facility]. They will work on prior auth before scheduling pt.

## 2024-03-27 ENCOUNTER — Other Ambulatory Visit (HOSPITAL_COMMUNITY): Payer: Self-pay | Admitting: Diagnostic Neuroimaging

## 2024-03-30 ENCOUNTER — Telehealth (HOSPITAL_COMMUNITY): Payer: Self-pay

## 2024-03-30 NOTE — Telephone Encounter (Signed)
 Auth Submission: APPROVED Site of care: Site of care: [facility] Payer: [facility] Community Medication & CPT/J Code(s) submitted: Ocrevus  William Fitzgerald) G7649 Diagnosis Code: G35.D Route of submission (phone, fax, portal):  Phone # Fax # Auth type: Buy/Bill HB Units/visits requested: 300mg  x 2 doses, then 600mg  q1months Reference number: [id number]  Approval from: 02/22/24 to 02/21/25

## 2024-04-03 ENCOUNTER — Ambulatory Visit: Payer: Self-pay | Admitting: Diagnostic Neuroimaging

## 2024-04-07 NOTE — Telephone Encounter (Signed)
 It appears that patient has been scheduled for December 9 at the [facility] for an Ocrevus  infusion.

## 2024-04-14 ENCOUNTER — Ambulatory Visit (HOSPITAL_COMMUNITY)

## 2024-04-20 ENCOUNTER — Ambulatory Visit (HOSPITAL_COMMUNITY)
Admission: RE | Admit: 2024-04-20 | Discharge: 2024-04-20 | Disposition: A | Discharge status: Home / Self Care | Source: Ambulatory Visit | Attending: Internal Medicine | Admitting: Internal Medicine

## 2024-04-20 VITALS — BP 124/56 | HR 62 | Temp 98.1°F | Resp 16

## 2024-04-20 DIAGNOSIS — G35D Multiple sclerosis, unspecified: Secondary | ICD-10-CM

## 2024-04-20 MED ORDER — METHYLPREDNISOLONE SODIUM SUCC 125 MG IJ SOLR
125.0000 mg | Freq: Once | INTRAMUSCULAR | Status: AC
  Administered 2024-04-20: 09:00:00 125 mg via INTRAVENOUS
  Filled 2024-04-20: qty 2

## 2024-04-20 MED ORDER — SODIUM CHLORIDE 0.9 % IV SOLN
300.0000 mg | Freq: Once | INTRAVENOUS | Status: AC
  Administered 2024-04-20: 10:00:00 300 mg via INTRAVENOUS
  Filled 2024-04-20: qty 10

## 2024-04-20 MED ORDER — FAMOTIDINE 20 MG PO TABS
20.0000 mg | ORAL_TABLET | Freq: Once | ORAL | Status: AC
  Administered 2024-04-20: 09:00:00 20 mg via ORAL
  Filled 2024-04-20: qty 1

## 2024-04-20 MED ORDER — ACETAMINOPHEN 325 MG PO TABS
650.0000 mg | ORAL_TABLET | Freq: Once | ORAL | Status: AC
  Administered 2024-04-20: 09:00:00 650 mg via ORAL
  Filled 2024-04-20: qty 2

## 2024-04-20 MED ORDER — DIPHENHYDRAMINE HCL 25 MG PO CAPS
50.0000 mg | ORAL_CAPSULE | Freq: Once | ORAL | Status: AC
  Administered 2024-04-20: 09:00:00 50 mg via ORAL
  Filled 2024-04-20: qty 2

## 2024-04-20 NOTE — Progress Notes (Signed)
 PATIENT CARE CENTER NOTE   Diagnosis: Multiple sclerosis [G35.D]    Provider: Margaret Eduard SAUNDERS, MD    Procedure: Ocrevus  300 mg infusion    Note: Patient received Ocrevus  300 mg infusion (dose # 1 of 2) via PIV. Patient pre-medicated with PO Tylenol , PO Benadryl , PO Pepcid  and IV Solu-medrol  per order. Patient tolerated infusion well with no adverse reaction. Observed patient for 1 hour post infusion. Vital signs stable. Discharge instructions given. Patient to come back in 2 weeks for second dose of Ocrevus  300 mg and will schedule his next appointment at the front desk. Patient alert, oriented and ambulatory at discharge.

## 2024-04-29 ENCOUNTER — Telehealth: Payer: Self-pay | Admitting: Primary Care

## 2024-04-29 NOTE — Telephone Encounter (Signed)
 Copied from CRM #[id number]. Topic: General - Other >> Apr 28, 2024  3:40 PM Kevelyn M wrote: Reason for CRM: Patient calling in to schedule and Ocrevus  #1 of 4 authorized Continuity of Care infusion.  Call back # [phone]

## 2024-05-05 ENCOUNTER — Encounter (HOSPITAL_COMMUNITY): Payer: Self-pay | Admitting: Diagnostic Neuroimaging

## 2024-05-05 ENCOUNTER — Ambulatory Visit (HOSPITAL_COMMUNITY)
Admission: RE | Admit: 2024-05-05 | Discharge: 2024-05-05 | Disposition: A | Discharge status: Home / Self Care | Source: Ambulatory Visit | Attending: Nurse Practitioner | Admitting: Nurse Practitioner

## 2024-05-05 VITALS — BP 123/72 | HR 71 | Temp 97.9°F | Resp 20

## 2024-05-05 DIAGNOSIS — G35D Multiple sclerosis, unspecified: Secondary | ICD-10-CM

## 2024-05-05 MED ORDER — DIPHENHYDRAMINE HCL 25 MG PO CAPS
50.0000 mg | ORAL_CAPSULE | Freq: Once | ORAL | Status: AC
  Administered 2024-05-05: 50 mg via ORAL
  Filled 2024-05-05: qty 2

## 2024-05-05 MED ORDER — METHYLPREDNISOLONE SODIUM SUCC 125 MG IJ SOLR
125.0000 mg | Freq: Once | INTRAMUSCULAR | Status: AC
  Administered 2024-05-05: 125 mg via INTRAVENOUS
  Filled 2024-05-05: qty 2

## 2024-05-05 MED ORDER — FAMOTIDINE 20 MG PO TABS
20.0000 mg | ORAL_TABLET | Freq: Once | ORAL | Status: AC
  Administered 2024-05-05: 20 mg via ORAL
  Filled 2024-05-05: qty 1

## 2024-05-05 MED ORDER — SODIUM CHLORIDE 0.9 % IV SOLN
300.0000 mg | Freq: Once | INTRAVENOUS | Status: AC
  Administered 2024-05-05: 300 mg via INTRAVENOUS
  Filled 2024-05-05: qty 10

## 2024-05-05 MED ORDER — ACETAMINOPHEN 325 MG PO TABS
650.0000 mg | ORAL_TABLET | Freq: Once | ORAL | Status: AC
  Administered 2024-05-05: 650 mg via ORAL
  Filled 2024-05-05: qty 2

## 2024-05-05 NOTE — Progress Notes (Signed)
 Diagnosis: Multiple Sclerosis (G35.D)    Provider: Margaret Cora R MD    Procedure: Ocrevus  300 mg infusion    Note: patient received Ocrevus  300 mg infusion ( dose 2 of 2) via PIV .  Patient pre medicated with po tylenol , benadryl  and pepcid , and IV Solu medrol  per order.  Patient tolerated infusion without adverse reaction.  Observed one hour post op.  Patient to return in 6 months per order he was directed to set up appt at discharge desk upon leaving. Patient alert, oriented and ambulatory, vital sign stable prior to leaving .

## 2024-05-13 ENCOUNTER — Encounter (HOSPITAL_COMMUNITY): Payer: Self-pay | Admitting: Diagnostic Neuroimaging

## 2024-08-04 ENCOUNTER — Ambulatory Visit: Admitting: Diagnostic Neuroimaging

## 2024-09-09 ENCOUNTER — Ambulatory Visit: Admitting: Diagnostic Neuroimaging

## 2024-11-03 ENCOUNTER — Ambulatory Visit (HOSPITAL_COMMUNITY)
